# Patient Record
Sex: Female | Born: 1945
Health system: Southern US, Community
[De-identification: ages and names within clinical notes are randomized; demographics above are authoritative.]

## PROBLEM LIST (undated history)

## (undated) DIAGNOSIS — R011 Cardiac murmur, unspecified: Secondary | ICD-10-CM

## (undated) DIAGNOSIS — C4492 Squamous cell carcinoma of skin, unspecified: Secondary | ICD-10-CM

## (undated) DIAGNOSIS — B000 Eczema herpeticum: Secondary | ICD-10-CM

## (undated) DIAGNOSIS — T7840XA Allergy, unspecified, initial encounter: Secondary | ICD-10-CM

## (undated) DIAGNOSIS — I10 Essential (primary) hypertension: Secondary | ICD-10-CM

## (undated) DIAGNOSIS — N959 Unspecified menopausal and perimenopausal disorder: Secondary | ICD-10-CM

## (undated) DIAGNOSIS — H409 Unspecified glaucoma: Secondary | ICD-10-CM

## (undated) DIAGNOSIS — M8580 Other specified disorders of bone density and structure, unspecified site: Secondary | ICD-10-CM

## (undated) DIAGNOSIS — E785 Hyperlipidemia, unspecified: Secondary | ICD-10-CM

## (undated) HISTORY — DX: Other specified disorders of bone density and structure, unspecified site: M85.80

## (undated) HISTORY — DX: Squamous cell carcinoma of skin, unspecified: C44.92

## (undated) HISTORY — PX: OTHER SURGICAL HISTORY: SHX169

## (undated) HISTORY — DX: Hyperlipidemia, unspecified: E78.5

## (undated) HISTORY — DX: Eczema herpeticum: B00.0

## (undated) HISTORY — DX: Unspecified menopausal and perimenopausal disorder: N95.9

## (undated) HISTORY — DX: Unspecified glaucoma: H40.9

## (undated) HISTORY — DX: Essential (primary) hypertension: I10

## (undated) HISTORY — DX: Allergy, unspecified, initial encounter: T78.40XA

## (undated) HISTORY — DX: Cardiac murmur, unspecified: R01.1

## (undated) HISTORY — PX: ABDOMINAL HYSTERECTOMY: SHX81

---

## 2004-09-27 ENCOUNTER — Ambulatory Visit: Payer: Self-pay | Admitting: Gastroenterology

## 2009-03-23 ENCOUNTER — Emergency Department: Payer: Self-pay | Admitting: Emergency Medicine

## 2010-05-27 ENCOUNTER — Ambulatory Visit: Payer: Self-pay

## 2010-12-12 HISTORY — PX: BREAST BIOPSY: SHX20

## 2011-01-04 ENCOUNTER — Ambulatory Visit: Payer: Self-pay | Admitting: Family Medicine

## 2011-01-11 ENCOUNTER — Ambulatory Visit: Payer: Self-pay | Admitting: Family Medicine

## 2011-08-11 ENCOUNTER — Ambulatory Visit: Payer: Self-pay | Admitting: General Surgery

## 2011-08-11 ENCOUNTER — Emergency Department: Payer: Self-pay | Admitting: Emergency Medicine

## 2012-02-06 ENCOUNTER — Ambulatory Visit: Payer: Self-pay | Admitting: Family Medicine

## 2012-02-08 ENCOUNTER — Ambulatory Visit: Payer: Self-pay | Admitting: Family Medicine

## 2012-07-19 DIAGNOSIS — E785 Hyperlipidemia, unspecified: Secondary | ICD-10-CM | POA: Diagnosis not present

## 2012-07-19 DIAGNOSIS — I10 Essential (primary) hypertension: Secondary | ICD-10-CM | POA: Diagnosis not present

## 2012-09-13 DIAGNOSIS — E785 Hyperlipidemia, unspecified: Secondary | ICD-10-CM | POA: Diagnosis not present

## 2013-02-01 DIAGNOSIS — E785 Hyperlipidemia, unspecified: Secondary | ICD-10-CM | POA: Diagnosis not present

## 2013-02-01 DIAGNOSIS — I10 Essential (primary) hypertension: Secondary | ICD-10-CM | POA: Diagnosis not present

## 2013-02-01 DIAGNOSIS — Z0000 Encounter for general adult medical examination without abnormal findings: Secondary | ICD-10-CM | POA: Diagnosis not present

## 2013-02-14 ENCOUNTER — Ambulatory Visit: Payer: Self-pay

## 2013-02-14 DIAGNOSIS — Z1231 Encounter for screening mammogram for malignant neoplasm of breast: Secondary | ICD-10-CM | POA: Diagnosis not present

## 2013-02-20 ENCOUNTER — Ambulatory Visit: Payer: Self-pay

## 2013-02-20 DIAGNOSIS — M899 Disorder of bone, unspecified: Secondary | ICD-10-CM | POA: Diagnosis not present

## 2013-02-20 DIAGNOSIS — M949 Disorder of cartilage, unspecified: Secondary | ICD-10-CM | POA: Diagnosis not present

## 2013-03-01 DIAGNOSIS — I10 Essential (primary) hypertension: Secondary | ICD-10-CM | POA: Diagnosis not present

## 2013-08-09 DIAGNOSIS — I10 Essential (primary) hypertension: Secondary | ICD-10-CM | POA: Diagnosis not present

## 2013-08-09 DIAGNOSIS — E785 Hyperlipidemia, unspecified: Secondary | ICD-10-CM | POA: Diagnosis not present

## 2013-08-26 DIAGNOSIS — I10 Essential (primary) hypertension: Secondary | ICD-10-CM | POA: Diagnosis not present

## 2013-10-08 DIAGNOSIS — I10 Essential (primary) hypertension: Secondary | ICD-10-CM | POA: Diagnosis not present

## 2013-10-10 DIAGNOSIS — H52229 Regular astigmatism, unspecified eye: Secondary | ICD-10-CM | POA: Diagnosis not present

## 2013-10-10 DIAGNOSIS — H2510 Age-related nuclear cataract, unspecified eye: Secondary | ICD-10-CM | POA: Diagnosis not present

## 2013-10-10 DIAGNOSIS — H5200 Hypermetropia, unspecified eye: Secondary | ICD-10-CM | POA: Diagnosis not present

## 2013-10-10 DIAGNOSIS — H524 Presbyopia: Secondary | ICD-10-CM | POA: Diagnosis not present

## 2013-12-12 HISTORY — PX: BREAST BIOPSY: SHX20

## 2014-02-18 ENCOUNTER — Ambulatory Visit: Payer: Self-pay

## 2014-02-18 DIAGNOSIS — Z1231 Encounter for screening mammogram for malignant neoplasm of breast: Secondary | ICD-10-CM | POA: Diagnosis not present

## 2014-04-02 DIAGNOSIS — Z23 Encounter for immunization: Secondary | ICD-10-CM | POA: Diagnosis not present

## 2014-04-02 DIAGNOSIS — R3129 Other microscopic hematuria: Secondary | ICD-10-CM | POA: Diagnosis not present

## 2014-04-02 DIAGNOSIS — E785 Hyperlipidemia, unspecified: Secondary | ICD-10-CM | POA: Diagnosis not present

## 2014-04-02 DIAGNOSIS — Z0000 Encounter for general adult medical examination without abnormal findings: Secondary | ICD-10-CM | POA: Diagnosis not present

## 2014-05-06 DIAGNOSIS — R319 Hematuria, unspecified: Secondary | ICD-10-CM | POA: Diagnosis not present

## 2014-10-20 DIAGNOSIS — E785 Hyperlipidemia, unspecified: Secondary | ICD-10-CM | POA: Diagnosis not present

## 2014-10-20 DIAGNOSIS — I10 Essential (primary) hypertension: Secondary | ICD-10-CM | POA: Diagnosis not present

## 2015-03-06 ENCOUNTER — Emergency Department: Payer: Self-pay | Admitting: Student

## 2015-03-06 DIAGNOSIS — Z79899 Other long term (current) drug therapy: Secondary | ICD-10-CM | POA: Diagnosis not present

## 2015-03-06 DIAGNOSIS — R000 Tachycardia, unspecified: Secondary | ICD-10-CM | POA: Diagnosis not present

## 2015-03-06 DIAGNOSIS — R509 Fever, unspecified: Secondary | ICD-10-CM | POA: Diagnosis not present

## 2015-03-06 DIAGNOSIS — I10 Essential (primary) hypertension: Secondary | ICD-10-CM | POA: Diagnosis not present

## 2015-03-06 DIAGNOSIS — R0602 Shortness of breath: Secondary | ICD-10-CM | POA: Diagnosis not present

## 2015-03-06 DIAGNOSIS — B349 Viral infection, unspecified: Secondary | ICD-10-CM | POA: Diagnosis not present

## 2015-03-06 DIAGNOSIS — R9431 Abnormal electrocardiogram [ECG] [EKG]: Secondary | ICD-10-CM | POA: Diagnosis not present

## 2015-03-06 LAB — URINALYSIS, COMPLETE
Bacteria: NONE SEEN
Bilirubin,UR: NEGATIVE
Glucose,UR: NEGATIVE mg/dL
Ketone: NEGATIVE
Leukocyte Esterase: NEGATIVE
Nitrite: NEGATIVE
Ph: 8
Protein: NEGATIVE
RBC,UR: 1 /HPF
Specific Gravity: 1.006
Squamous Epithelial: 16
WBC UR: <1 /HPF

## 2015-03-06 LAB — COMPREHENSIVE METABOLIC PANEL WITH GFR
Albumin: 3.8 g/dL
Alkaline Phosphatase: 59 U/L
Anion Gap: 8
BUN: 8 mg/dL
Bilirubin,Total: 0.6 mg/dL
Calcium, Total: 8.6 mg/dL — ABNORMAL LOW
Chloride: 106 mmol/L
Co2: 25 mmol/L
Creatinine: 0.66 mg/dL
EGFR (African American): >60
EGFR (Non-African Amer.): >60
Glucose: 125 mg/dL — ABNORMAL HIGH
Potassium: 3.4 mmol/L — ABNORMAL LOW
SGOT(AST): 21 U/L
SGPT (ALT): 19 U/L
Sodium: 139 mmol/L
Total Protein: 6.7 g/dL

## 2015-03-06 LAB — CBC WITH DIFFERENTIAL/PLATELET
BASOS PCT: 0.9 %
Basophil #: 0.1 10*3/uL (ref 0.0–0.1)
EOS PCT: 0.1 %
Eosinophil #: 0 10*3/uL (ref 0.0–0.7)
HCT: 41.9 % (ref 35.0–47.0)
HGB: 13.7 g/dL (ref 12.0–16.0)
Lymphocyte #: 0.5 10*3/uL — ABNORMAL LOW (ref 1.0–3.6)
Lymphocyte %: 3.3 %
MCH: 30 pg (ref 26.0–34.0)
MCHC: 32.6 g/dL (ref 32.0–36.0)
MCV: 92 fL (ref 80–100)
MONO ABS: 0.5 x10 3/mm (ref 0.2–0.9)
MONOS PCT: 3.3 %
Neutrophil #: 14.5 10*3/uL — ABNORMAL HIGH (ref 1.4–6.5)
Neutrophil %: 92.4 %
Platelet: 278 10*3/uL (ref 150–440)
RBC: 4.55 10*6/uL (ref 3.80–5.20)
RDW: 14.2 % (ref 11.5–14.5)
WBC: 15.7 10*3/uL — AB (ref 3.6–11.0)

## 2015-03-06 LAB — PHOSPHORUS: Phosphorus: 2.1 mg/dL — ABNORMAL LOW

## 2015-03-06 LAB — LACTIC ACID, PLASMA: Lactic Acid, Venous: 1.1 mmol/L

## 2015-03-06 LAB — PROTIME-INR
INR: 1.1
Prothrombin Time: 14 s

## 2015-03-06 LAB — MAGNESIUM: MAGNESIUM: 1.6 mg/dL — AB

## 2015-03-06 LAB — RAPID INFLUENZA A&B ANTIGENS (ARMC ONLY)

## 2015-03-06 LAB — TROPONIN I: Troponin-I: <0.03 ng/mL

## 2015-03-07 LAB — URINE CULTURE

## 2015-03-11 LAB — CULTURE, BLOOD (SINGLE)

## 2015-03-18 DIAGNOSIS — R070 Pain in throat: Secondary | ICD-10-CM | POA: Diagnosis not present

## 2015-03-18 DIAGNOSIS — J029 Acute pharyngitis, unspecified: Secondary | ICD-10-CM | POA: Diagnosis not present

## 2015-03-20 ENCOUNTER — Ambulatory Visit
Admit: 2015-03-20 | Disposition: A | Discharge status: Home / Self Care | Payer: Self-pay | Attending: Unknown Physician Specialty | Admitting: Unknown Physician Specialty

## 2015-03-20 DIAGNOSIS — Z1231 Encounter for screening mammogram for malignant neoplasm of breast: Secondary | ICD-10-CM | POA: Diagnosis not present

## 2015-03-20 LAB — HM MAMMOGRAPHY: HM MAMMO: NORMAL (ref 0–4)

## 2015-04-20 DIAGNOSIS — I10 Essential (primary) hypertension: Secondary | ICD-10-CM | POA: Diagnosis not present

## 2015-04-20 DIAGNOSIS — E785 Hyperlipidemia, unspecified: Secondary | ICD-10-CM | POA: Diagnosis not present

## 2015-07-06 ENCOUNTER — Other Ambulatory Visit: Payer: Self-pay | Admitting: Unknown Physician Specialty

## 2015-08-31 ENCOUNTER — Other Ambulatory Visit: Payer: Self-pay

## 2015-08-31 MED ORDER — ESTRADIOL 0.075 MG/24HR TD PTTW: 1.0000 | MEDICATED_PATCH | TRANSDERMAL | Status: DC

## 2015-08-31 NOTE — Telephone Encounter (Signed)
Patient was last seen 04/20/15, practice partner number is [phone], and pharmacy is [facility] in [place].

## 2015-12-28 ENCOUNTER — Other Ambulatory Visit: Payer: Self-pay | Admitting: Unknown Physician Specialty

## 2016-01-06 IMAGING — MG MM DIGITAL SCREENING BILAT W/ CAD
1 series · 4 of 4 positions shown · non-contrast
Comparison: none

[R CC · right · 4 of 4 slices shown]
[im 1/4]
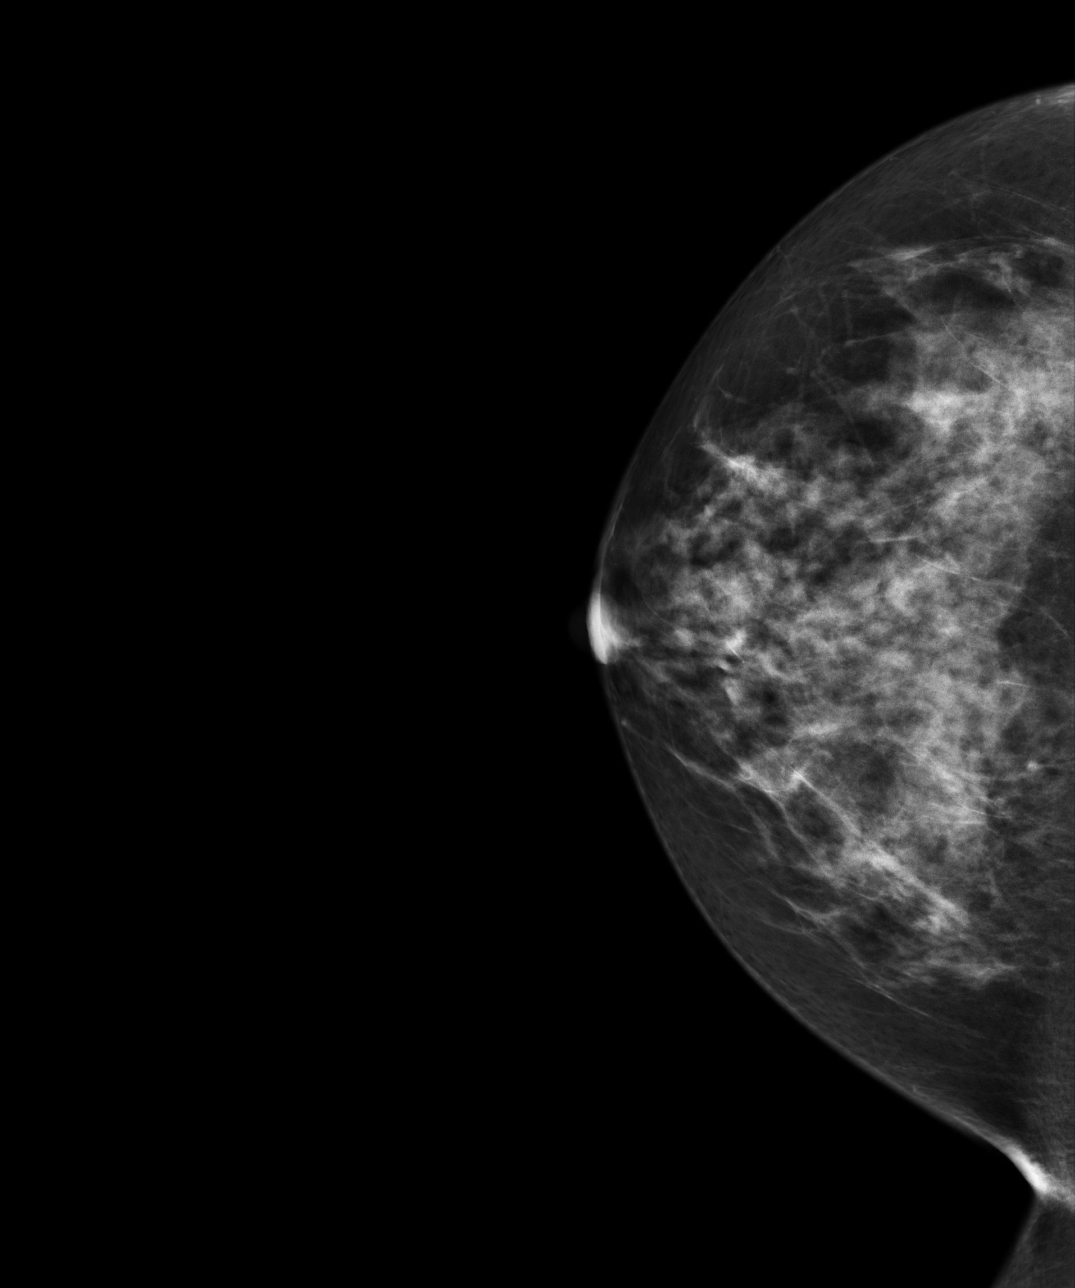
[im 2/4]
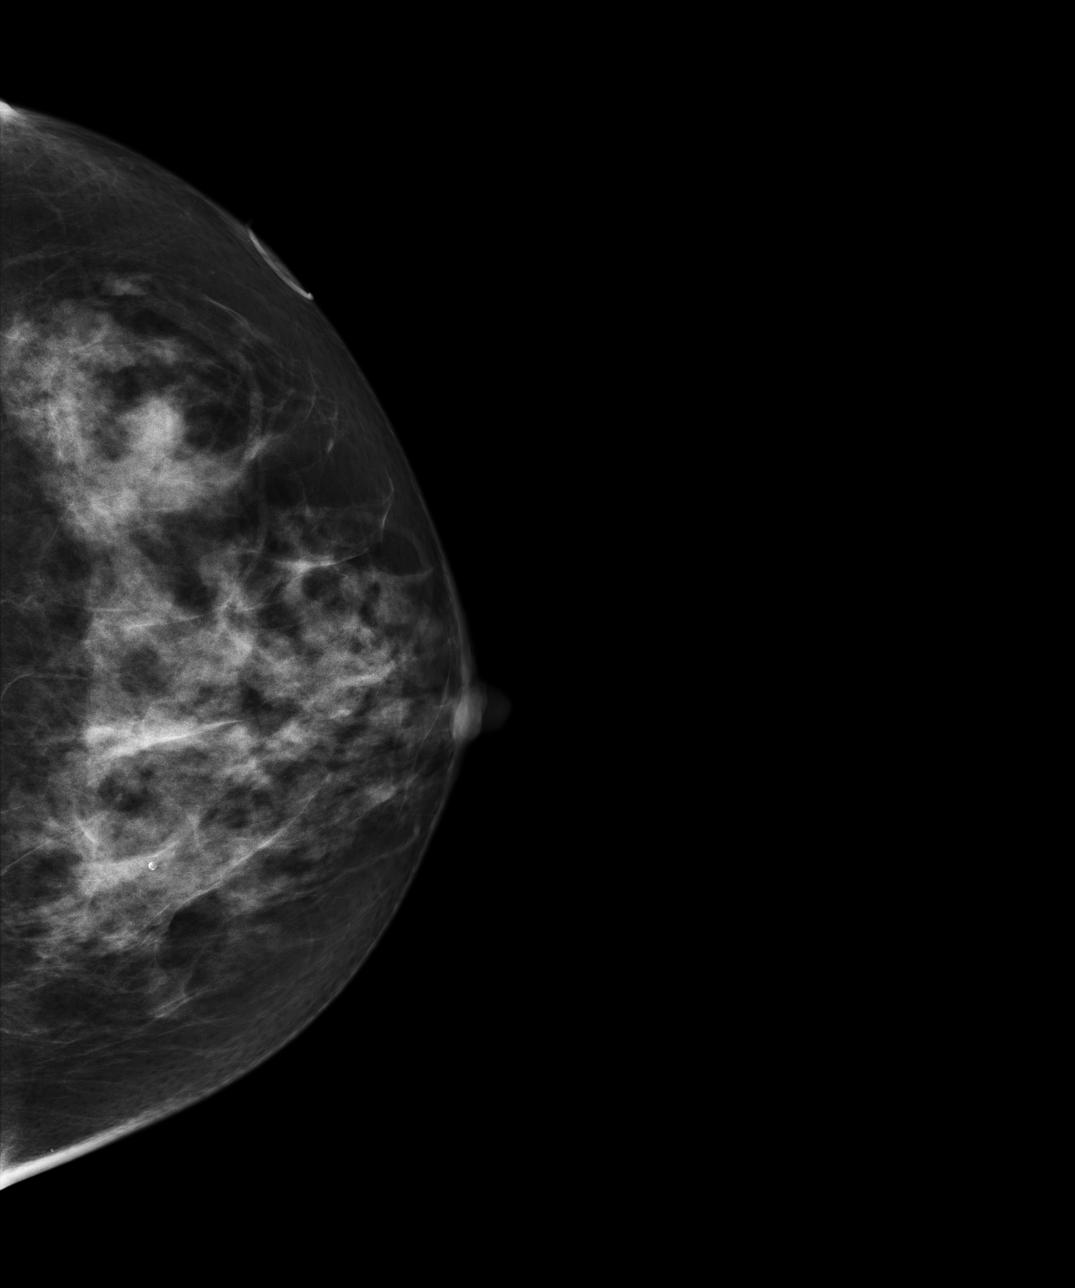
[im 3/4]
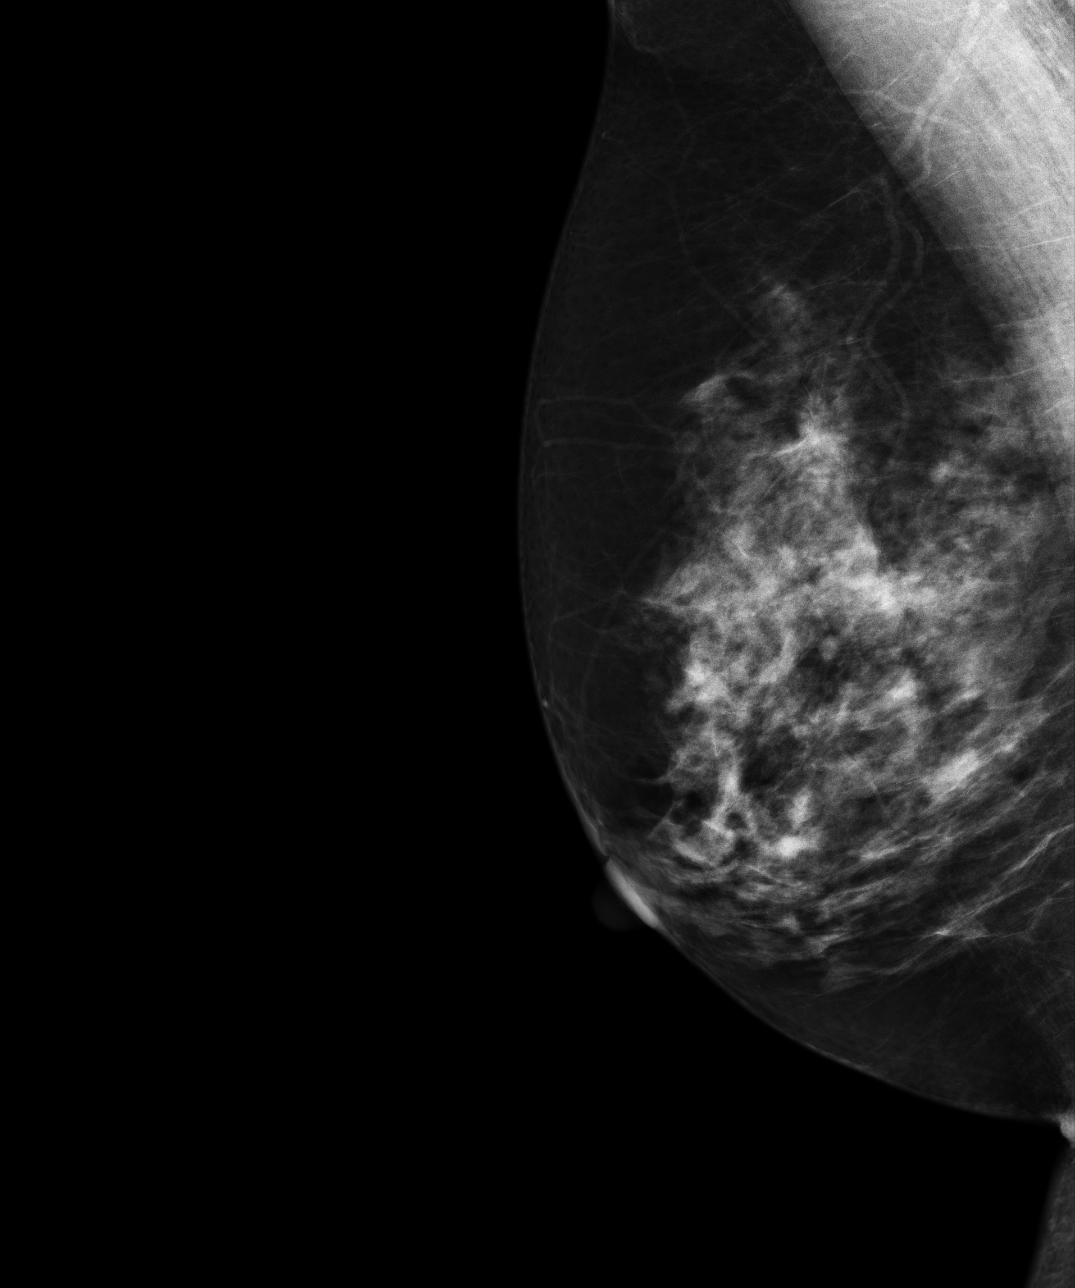
[im 4/4]
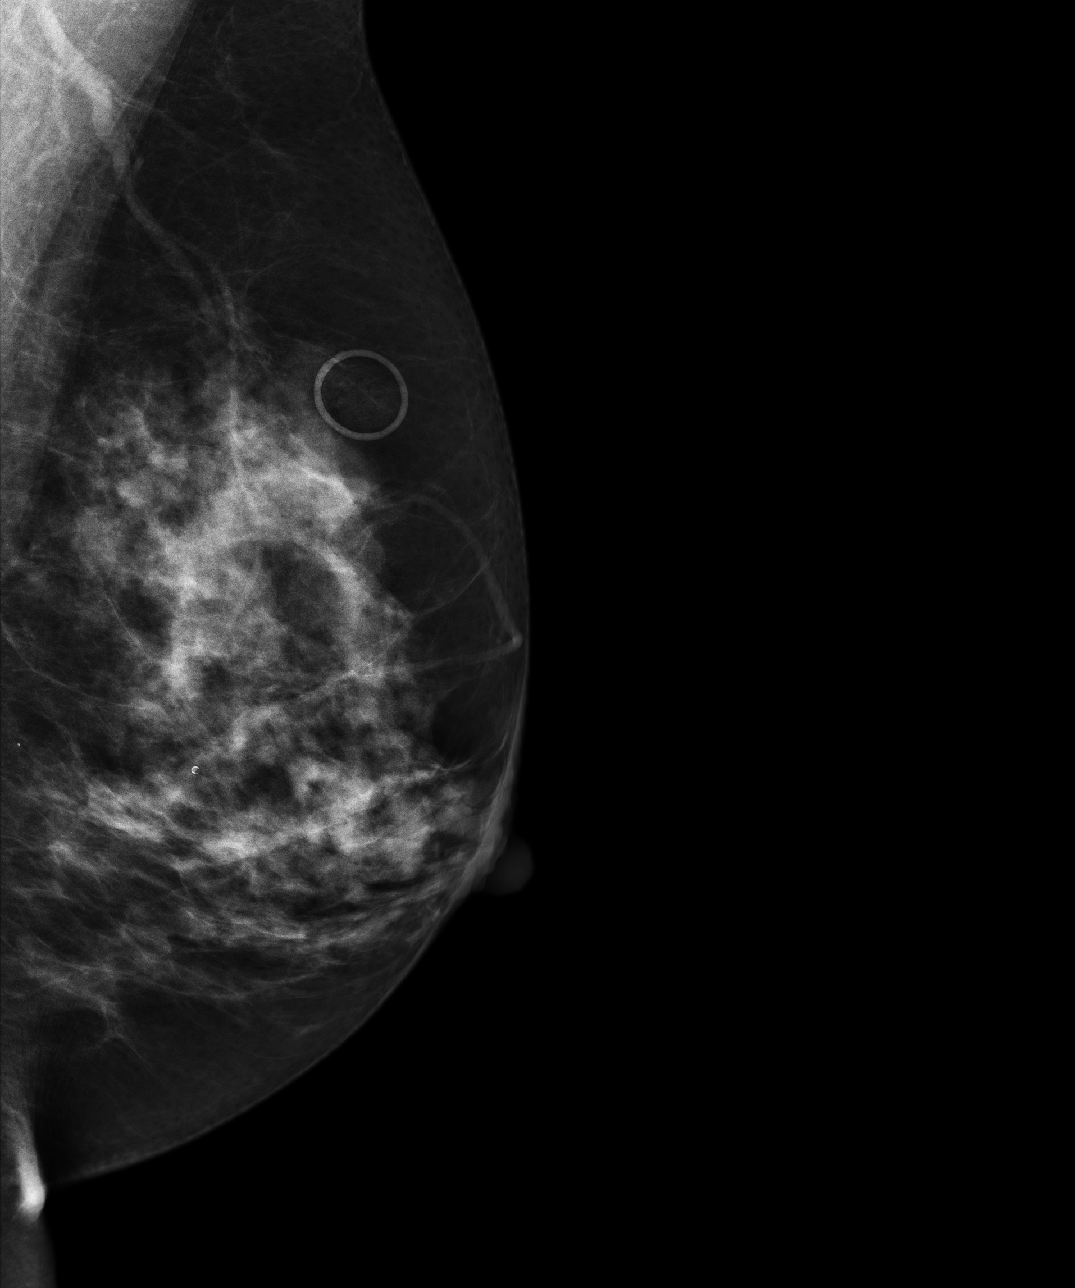

[4 of 4 positions shown; findings below may reference images not displayed]

CLINICAL DATA
Screening.

EXAM
DIGITAL SCREENING BILATERAL MAMMOGRAM WITH CAD

COMPARISON
Previous exam(s).

ACR BREAST DENSITY
ACR Breast Density Category c: The breast tissue is heterogeneously
dense, which may obscure small masses.

FINDINGS
There are no findings suspicious for malignancy. Images were
processed with CAD.

IMPRESSION
No mammographic evidence of malignancy. A result letter of this
screening mammogram will be mailed directly to the patient.

RECOMMENDATION
Screening mammogram in one year. (Code:[id number])

BI-RADS CATEGORY
1: Negative.

SIGNATURE

## 2016-02-18 ENCOUNTER — Encounter: Payer: Self-pay | Admitting: *Deleted

## 2016-03-07 ENCOUNTER — Other Ambulatory Visit: Payer: Self-pay | Admitting: Unknown Physician Specialty

## 2016-03-22 DIAGNOSIS — I10 Essential (primary) hypertension: Secondary | ICD-10-CM | POA: Insufficient documentation

## 2016-03-22 DIAGNOSIS — M85851 Other specified disorders of bone density and structure, right thigh: Secondary | ICD-10-CM | POA: Insufficient documentation

## 2016-03-22 DIAGNOSIS — B000 Eczema herpeticum: Secondary | ICD-10-CM | POA: Insufficient documentation

## 2016-03-22 DIAGNOSIS — N959 Unspecified menopausal and perimenopausal disorder: Secondary | ICD-10-CM | POA: Insufficient documentation

## 2016-03-22 DIAGNOSIS — E785 Hyperlipidemia, unspecified: Secondary | ICD-10-CM | POA: Insufficient documentation

## 2016-03-22 DIAGNOSIS — M8580 Other specified disorders of bone density and structure, unspecified site: Secondary | ICD-10-CM

## 2016-04-13 ENCOUNTER — Ambulatory Visit (INDEPENDENT_AMBULATORY_CARE_PROVIDER_SITE_OTHER): Payer: Medicare Other | Admitting: Unknown Physician Specialty

## 2016-04-13 ENCOUNTER — Encounter: Payer: Self-pay | Admitting: Unknown Physician Specialty

## 2016-04-13 VITALS — BP 123/86 | HR 87 | Temp 97.4°F | Ht 63.1 in | Wt 167.0 lb

## 2016-04-13 DIAGNOSIS — I10 Essential (primary) hypertension: Secondary | ICD-10-CM | POA: Diagnosis not present

## 2016-04-13 DIAGNOSIS — N959 Unspecified menopausal and perimenopausal disorder: Secondary | ICD-10-CM

## 2016-04-13 DIAGNOSIS — Z0000 Encounter for general adult medical examination without abnormal findings: Secondary | ICD-10-CM

## 2016-04-13 DIAGNOSIS — Z23 Encounter for immunization: Secondary | ICD-10-CM

## 2016-04-13 DIAGNOSIS — Z1231 Encounter for screening mammogram for malignant neoplasm of breast: Secondary | ICD-10-CM | POA: Diagnosis not present

## 2016-04-13 MED ORDER — ESTRADIOL 0.075 MG/24HR TD PTTW: 1.0000 | MEDICATED_PATCH | TRANSDERMAL | Status: DC

## 2016-04-13 NOTE — Assessment & Plan Note (Signed)
Stable, continue present medications.   

## 2016-04-13 NOTE — Progress Notes (Signed)
BP 123/86 mmHg  Pulse 87  Temp(Src) 97.4 F (36.3 C)  Ht 5' 3.1" (1.603 m)  Wt 167 lb (75.751 kg)  BMI 29.48 kg/m2  SpO2 97%  LMP  (LMP Unknown)   Subjective:    Patient ID: Janet Taylor, female    DOB: Aug 08, 1946, 70 y.o.   MRN: [id number]  HPI: Janet Taylor is a 70 y.o. female  Chief Complaint  Patient presents with  . Medicare Wellness    Hep C order entered   Functional Status Survey: Is the patient deaf or have difficulty hearing?: No Does the patient have difficulty seeing, even when wearing glasses/contacts?: Yes (pt states she has cataracts) Does the patient have difficulty concentrating, remembering, or making decisions?: No Does the patient have difficulty walking or climbing stairs?: No Does the patient have difficulty dressing or bathing?: No Does the patient have difficulty doing errands alone such as visiting a doctor's office or shopping?: No  Fall Risk  04/13/2016 12/04/2015  Falls in the past year? No No   Depression screen PHQ 2/9 04/13/2016  Decreased Interest 0  Down, Depressed, Hopeless 0  PHQ - 2 Score 0    Family History  Problem Relation Age of Onset  . Hypertension Father   . Hypertension Sister    Social History   Social History  . Marital Status: Divorced    Spouse Name: N/A  . Number of Children: N/A  . Years of Education: N/A   Occupational History  . Not on file.   Social History Main Topics  . Smoking status: Never Smoker   . Smokeless tobacco: Never Used  . Alcohol Use: 0.0 oz/week    0 Standard drinks or equivalent per week     Comment: on weekends  . Drug Use: No  . Sexual Activity: No   Other Topics Concern  . Not on file   Social History Narrative   Past Surgical History  Procedure Laterality Date  . Abdominal hysterectomy      complete   Past Medical History  Diagnosis Date  . Osteopenia   . Hypertension   . Eczema herpeticum   . Hyperlipidemia   . Menopausal disorder     Hypertension Using  medications without difficulty Average home BPs: not checking   No problems or lightheadedness No chest pain with exertion or shortness of breath No Edema  Menopause Trying to decrease use of hormone patch.    Mini cog is normal  Relevant past medical, surgical, family and social history reviewed and updated as indicated. Interim medical history since our last visit reviewed. Allergies and medications reviewed and updated.  Review of Systems  Per HPI unless specifically indicated above     Objective:    BP 123/86 mmHg  Pulse 87  Temp(Src) 97.4 F (36.3 C)  Ht 5' 3.1" (1.603 m)  Wt 167 lb (75.751 kg)  BMI 29.48 kg/m2  SpO2 97%  LMP  (LMP Unknown)  Wt Readings from Last 3 Encounters:  04/13/16 167 lb (75.751 kg)  04/20/15 163 lb (73.936 kg)    Physical Exam  Constitutional: She is oriented to person, place, and time. She appears well-developed and well-nourished.  HENT:  Head: Normocephalic and atraumatic.  Eyes: Pupils are equal, round, and reactive to light. Right eye exhibits no discharge. Left eye exhibits no discharge. No scleral icterus.  Neck: Normal range of motion. Neck supple. Carotid bruit is not present. No thyromegaly present.  Cardiovascular: Normal rate, regular rhythm and  normal heart sounds.  Exam reveals no gallop and no friction rub.   No murmur heard. Pulmonary/Chest: Effort normal and breath sounds normal. No respiratory distress. She has no wheezes. She has no rales.  Abdominal: Soft. Bowel sounds are normal. There is no tenderness. There is no rebound.  Genitourinary: No breast swelling, tenderness or discharge.  Musculoskeletal: Normal range of motion.  Lymphadenopathy:    She has no cervical adenopathy.  Neurological: She is alert and oriented to person, place, and time.  Skin: Skin is warm, dry and intact. No rash noted.  Psychiatric: She has a normal mood and affect. Her speech is normal and behavior is normal. Judgment and thought content  normal. Cognition and memory are normal.    Results for orders placed or performed in visit on 03/22/16  HM MAMMOGRAPHY  Result Value Ref Range   HM Mammogram Self Reported Normal 0-4 Bi-Rad, Self Reported Normal      Assessment & Plan:   Problem List Items Addressed This Visit      Unprioritized   Hypertension    Stable, continue present medications.        Relevant Orders   Comprehensive metabolic panel   Lipid Panel w/o Chol/HDL Ratio   Menopausal disorder    Continue to decrease use of hormone patch as tolerated       Other Visit Diagnoses    Need for pneumococcal vaccination    -  Primary    Relevant Orders    Pneumococcal polysaccharide vaccine 23-valent greater than or equal to 2yo subcutaneous/IM (Completed)    Health care maintenance        Relevant Orders    Hepatitis C antibody    MM DIGITAL SCREENING BILATERAL    Cologuard    Encounter for screening mammogram for breast cancer        Relevant Orders    MM DIGITAL SCREENING BILATERAL        Follow up plan: Return in about 6 months (around 10/14/2016).

## 2016-04-13 NOTE — Assessment & Plan Note (Signed)
Continue to decrease use of hormone patch as tolerated

## 2016-04-13 NOTE — Patient Instructions (Addendum)
Pneumococcal Polysaccharide Vaccine: What You Need to Know 1. Why get vaccinated? Vaccination can protect older adults (and some children and younger adults) from pneumococcal disease. Pneumococcal disease is caused by bacteria that can spread from person to person through close contact. It can cause ear infections, and it can also lead to more serious infections of the:   Lungs (pneumonia),  Blood (bacteremia), and  Covering of the brain and spinal cord (meningitis). Meningitis can cause deafness and brain damage, and it can be fatal. Anyone can get pneumococcal disease, but children under 62 years of age, people with certain medical conditions, adults over 68 years of age, and cigarette smokers are at the highest risk. About 18,000 older adults die each year from pneumococcal disease in the [place]. Treatment of pneumococcal infections with penicillin and other drugs used to be more effective. But some strains of the disease have become resistant to these drugs. This makes prevention of the disease, through vaccination, even more important. 2. Pneumococcal polysaccharide vaccine (PPSV23) Pneumococcal polysaccharide vaccine (PPSV23) protects against 23 types of pneumococcal bacteria. It will not prevent all pneumococcal disease. PPSV23 is recommended for:  All adults 6 years of age and older,  Anyone 2 through 69 years of age with certain long-term health problems,  Anyone 2 through 69 years of age with a weakened immune system,  Adults 64 through 69 years of age who smoke cigarettes or have asthma. Most people need only one dose of PPSV. A second dose is recommended for certain high-risk groups. People 53 and older should get a dose even if they have gotten one or more doses of the vaccine before they turned 65. Your healthcare provider can give you more information about these recommendations. Most healthy adults develop protection within 2 to 3 weeks of getting the shot. 3. Some  people should not get this vaccine  Anyone who has had a life-threatening allergic reaction to PPSV should not get another dose.  Anyone who has a severe allergy to any component of PPSV should not receive it. Tell your provider if you have any severe allergies.  Anyone who is moderately or severely ill when the shot is scheduled may be asked to wait until they recover before getting the vaccine. Someone with a mild illness can usually be vaccinated.  Children less than 83 years of age should not receive this vaccine.  There is no evidence that PPSV is harmful to either a pregnant woman or to her fetus. However, as a precaution, women who need the vaccine should be vaccinated before becoming pregnant, if possible. 4. Risks of a vaccine reaction With any medicine, including vaccines, there is a chance of side effects. These are usually mild and go away on their own, but serious reactions are also possible. About half of people who get PPSV have mild side effects, such as redness or pain where the shot is given, which go away within about two days. Less than 1 out of 100 people develop a fever, muscle aches, or more severe local reactions. Problems that could happen after any vaccine:  People sometimes faint after a medical procedure, including vaccination. Sitting or lying down for about 15 minutes can help prevent fainting, and injuries caused by a fall. Tell your doctor if you feel dizzy, or have vision changes or ringing in the ears.  Some people get severe pain in the shoulder and have difficulty moving the arm where a shot was given. This happens very rarely.  Any medication  cause a severe allergic reaction. Such reactions from a vaccine are very rare, estimated at about 1 in a million doses, and would happen within a few minutes to a few hours after the vaccination. As with any medicine, there is a very remote chance of a vaccine causing a serious injury or death. The safety of  vaccines is always being monitored. For more information, visit: www.cdc.gov/vaccinesafety/ 5. What if there is a serious reaction? What should I look for? Look for anything that concerns you, such as signs of a severe allergic reaction, very high fever, or unusual behavior. Signs of a severe allergic reaction can include hives, swelling of the face and throat, difficulty breathing, a fast heartbeat, dizziness, and weakness. These would usually start a few minutes to a few hours after the vaccination. What should I do? If you think it is a severe allergic reaction or other emergency that can't wait, call 9-1-1 or get to the nearest hospital. Otherwise, call your doctor. Afterward, the reaction should be reported to the Vaccine Adverse Event Reporting System (VAERS). Your doctor might file this report, or you can do it yourself through the VAERS web site at www.vaers.hhs.gov, or by calling 1-800-822-7967. VAERS does not give medical advice. 6. How can I learn more?  Ask your doctor. He or she can give you the vaccine package insert or suggest other sources of information.  Call your local or state health department.  Contact the Centers for Disease Control and Prevention (CDC):  Call 1-800-232-4636 (1-800-CDC-INFO) or  Visit CDC's website at www.cdc.gov/vaccines CDC Pneumococcal Polysaccharide Vaccine VIS (04/04/14) This information is not intended to replace advice given to you by your health care provider. Make sure you discuss any questions you have with your health care provider. ----------------------------------------------------------------------  Please do call to schedule your mammogram; the number to schedule one at either [facility] or [facility] is [phone].     

## 2016-04-14 LAB — LIPID PANEL W/O CHOL/HDL RATIO
CHOLESTEROL TOTAL: 262 mg/dL — AB (ref 100–199)
HDL: 63 mg/dL (ref >39)
LDL Calculated: 183 mg/dL — ABNORMAL HIGH (ref 0–99)
Triglycerides: 80 mg/dL (ref 0–149)
VLDL CHOLESTEROL CAL: 16 mg/dL (ref 5–40)

## 2016-04-14 LAB — COMPREHENSIVE METABOLIC PANEL
ALK PHOS: 54 IU/L (ref 39–117)
ALT: 18 IU/L (ref 0–32)
AST: 18 IU/L (ref 0–40)
Albumin/Globulin Ratio: 1.8 (ref 1.2–2.2)
Albumin: 4.4 g/dL (ref 3.6–4.8)
BILIRUBIN TOTAL: 0.4 mg/dL (ref 0.0–1.2)
BUN / CREAT RATIO: 15 (ref 12–28)
BUN: 11 mg/dL (ref 8–27)
CHLORIDE: 95 mmol/L — AB (ref 96–106)
CO2: 25 mmol/L (ref 18–29)
Calcium: 9.6 mg/dL (ref 8.7–10.3)
Creatinine, Ser: 0.73 mg/dL (ref 0.57–1.00)
GFR calc Af Amer: 98 mL/min/{1.73_m2} (ref >59)
GFR calc non Af Amer: 85 mL/min/{1.73_m2} (ref >59)
GLUCOSE: 79 mg/dL (ref 65–99)
Globulin, Total: 2.5 g/dL (ref 1.5–4.5)
Potassium: 3.8 mmol/L (ref 3.5–5.2)
Sodium: 136 mmol/L (ref 134–144)
Total Protein: 6.9 g/dL (ref 6.0–8.5)

## 2016-04-14 LAB — HEPATITIS C ANTIBODY: Hep C Virus Ab: 0.1 s/co ratio (ref 0.0–0.9)

## 2016-04-15 ENCOUNTER — Telehealth: Payer: Self-pay | Admitting: Unknown Physician Specialty

## 2016-04-15 NOTE — Telephone Encounter (Signed)
Left message to call back about cholesterol

## 2016-04-18 ENCOUNTER — Telehealth: Payer: Self-pay | Admitting: Unknown Physician Specialty

## 2016-04-18 NOTE — Telephone Encounter (Signed)
Discussed very high cholesterol with patient.  She is refusing cholesterol medications and wants to work on her diet.

## 2016-05-26 DIAGNOSIS — R55 Syncope and collapse: Secondary | ICD-10-CM | POA: Diagnosis not present

## 2016-05-26 DIAGNOSIS — I10 Essential (primary) hypertension: Secondary | ICD-10-CM | POA: Diagnosis not present

## 2016-05-26 DIAGNOSIS — R112 Nausea with vomiting, unspecified: Secondary | ICD-10-CM | POA: Diagnosis not present

## 2016-05-26 DIAGNOSIS — R42 Dizziness and giddiness: Secondary | ICD-10-CM | POA: Diagnosis not present

## 2016-05-26 DIAGNOSIS — R404 Transient alteration of awareness: Secondary | ICD-10-CM | POA: Diagnosis not present

## 2016-05-26 DIAGNOSIS — R002 Palpitations: Secondary | ICD-10-CM | POA: Diagnosis not present

## 2016-05-26 DIAGNOSIS — I4891 Unspecified atrial fibrillation: Secondary | ICD-10-CM | POA: Diagnosis not present

## 2016-06-02 ENCOUNTER — Other Ambulatory Visit: Payer: Self-pay | Admitting: Unknown Physician Specialty

## 2016-06-02 ENCOUNTER — Ambulatory Visit
Admission: RE | Admit: 2016-06-02 | Discharge: 2016-06-02 | Disposition: A | Discharge status: Home / Self Care | Payer: Medicare Other | Source: Ambulatory Visit | Attending: Unknown Physician Specialty | Admitting: Unknown Physician Specialty

## 2016-06-02 DIAGNOSIS — Z1231 Encounter for screening mammogram for malignant neoplasm of breast: Secondary | ICD-10-CM

## 2016-06-02 DIAGNOSIS — Z0000 Encounter for general adult medical examination without abnormal findings: Secondary | ICD-10-CM

## 2016-06-06 ENCOUNTER — Other Ambulatory Visit: Payer: Self-pay | Admitting: Unknown Physician Specialty

## 2016-06-06 DIAGNOSIS — R928 Other abnormal and inconclusive findings on diagnostic imaging of breast: Secondary | ICD-10-CM

## 2016-06-15 DIAGNOSIS — I10 Essential (primary) hypertension: Secondary | ICD-10-CM | POA: Diagnosis not present

## 2016-06-15 DIAGNOSIS — H25813 Combined forms of age-related cataract, bilateral: Secondary | ICD-10-CM | POA: Diagnosis not present

## 2016-06-15 DIAGNOSIS — H5203 Hypermetropia, bilateral: Secondary | ICD-10-CM | POA: Diagnosis not present

## 2016-06-15 DIAGNOSIS — H52222 Regular astigmatism, left eye: Secondary | ICD-10-CM | POA: Diagnosis not present

## 2016-06-21 ENCOUNTER — Ambulatory Visit
Admission: RE | Admit: 2016-06-21 | Discharge: 2016-06-21 | Disposition: A | Discharge status: Home / Self Care | Payer: Medicare Other | Source: Ambulatory Visit | Attending: Unknown Physician Specialty | Admitting: Unknown Physician Specialty

## 2016-06-21 DIAGNOSIS — R922 Inconclusive mammogram: Secondary | ICD-10-CM | POA: Diagnosis not present

## 2016-06-21 DIAGNOSIS — R928 Other abnormal and inconclusive findings on diagnostic imaging of breast: Secondary | ICD-10-CM

## 2016-06-21 DIAGNOSIS — N63 Unspecified lump in breast: Secondary | ICD-10-CM | POA: Diagnosis not present

## 2016-07-07 ENCOUNTER — Other Ambulatory Visit: Payer: Self-pay | Admitting: Unknown Physician Specialty

## 2016-08-05 DIAGNOSIS — H2513 Age-related nuclear cataract, bilateral: Secondary | ICD-10-CM | POA: Diagnosis not present

## 2016-08-05 DIAGNOSIS — H40003 Preglaucoma, unspecified, bilateral: Secondary | ICD-10-CM | POA: Diagnosis not present

## 2016-08-05 DIAGNOSIS — H02839 Dermatochalasis of unspecified eye, unspecified eyelid: Secondary | ICD-10-CM | POA: Diagnosis not present

## 2016-08-05 DIAGNOSIS — H18413 Arcus senilis, bilateral: Secondary | ICD-10-CM | POA: Diagnosis not present

## 2016-08-24 ENCOUNTER — Ambulatory Visit (INDEPENDENT_AMBULATORY_CARE_PROVIDER_SITE_OTHER): Payer: Medicare Other | Admitting: Family Medicine

## 2016-08-24 ENCOUNTER — Ambulatory Visit
Admission: RE | Admit: 2016-08-24 | Discharge: 2016-08-24 | Disposition: A | Discharge status: Home / Self Care | Payer: Medicare Other | Source: Ambulatory Visit | Attending: Family Medicine | Admitting: Family Medicine

## 2016-08-24 ENCOUNTER — Encounter: Payer: Self-pay | Admitting: Family Medicine

## 2016-08-24 VITALS — BP 146/87 | HR 98 | Temp 98.2°F | Ht 64.0 in | Wt 169.8 lb

## 2016-08-24 DIAGNOSIS — M25562 Pain in left knee: Secondary | ICD-10-CM | POA: Insufficient documentation

## 2016-08-24 DIAGNOSIS — I10 Essential (primary) hypertension: Secondary | ICD-10-CM | POA: Diagnosis not present

## 2016-08-24 DIAGNOSIS — M1712 Unilateral primary osteoarthritis, left knee: Secondary | ICD-10-CM | POA: Insufficient documentation

## 2016-08-24 MED ORDER — MELOXICAM 15 MG PO TABS: 15.0000 mg | ORAL_TABLET | Freq: Every day | ORAL | 0 refills | Status: DC

## 2016-08-24 NOTE — Progress Notes (Addendum)
BP (!) 146/87 (BP Location: Right Arm, Patient Position: Sitting, Cuff Size: Normal)   Pulse 98   Temp 98.2 F (36.8 C)   Ht 5\' 4"  (1.626 m)   Wt 169 lb 12.8 oz (77 kg)   LMP  (LMP Unknown)   SpO2 95%   BMI 29.15 kg/m    Subjective:    Patient ID: Janet Taylor, female    DOB: Apr 20, 1946, 70 y.o.   MRN: [id number]  HPI: Janet Taylor is a 70 y.o. female  Chief Complaint  Patient presents with  . Knee Pain    pt states she had a fall a couple of years ago and thinks her left knee may be hurting from that. States it mainly hurts when bending the knee    Patient presents with left knee pain that has been intermittent for several years now. Seemed to have started with a bad fall about 2 years ago. Got foot caught in a vine in the yard and fell backward, twisting left leg. Ankle swelled up and bruised right away. Had both ankles x-rayed but no imaging of left knee or medical evaluation since incident.  Pain is intermittent and mostly with taking stairs. Notices increasing crepitus with these types of movements over time. Knee popped really loud with a certain movement 2 nights ago and it has been very sore since over the anterior patella. Has not tried much over the counter at this time.   BP high today as she forgot her two BP medications today. Does monitor closely at home, BPs have been mostly normal but sometimes a little elevated. Denies symptoms. Tries to always remember medication.   Relevant past medical, surgical, family and social history reviewed and updated as indicated. Interim medical history since our last visit reviewed. Allergies and medications reviewed and updated.  Review of Systems  Constitutional: Negative.   HENT: Negative.   Respiratory: Negative.   Cardiovascular: Negative.   Gastrointestinal: Negative.   Genitourinary: Negative.   Musculoskeletal: Positive for arthralgias.  Skin: Negative.   Neurological: Negative.   Psychiatric/Behavioral: Negative.       Per HPI unless specifically indicated above     Objective:    BP (!) 146/87 (BP Location: Right Arm, Patient Position: Sitting, Cuff Size: Normal)   Pulse 98   Temp 98.2 F (36.8 C)   Ht 5\' 4"  (1.626 m)   Wt 169 lb 12.8 oz (77 kg)   LMP  (LMP Unknown)   SpO2 95%   BMI 29.15 kg/m   Wt Readings from Last 3 Encounters:  08/24/16 169 lb 12.8 oz (77 kg)  04/13/16 167 lb (75.8 kg)  04/20/15 163 lb (73.9 kg)    Physical Exam  Constitutional: She is oriented to person, place, and time. She appears well-developed and well-nourished. No distress.  HENT:  Head: Atraumatic.  Eyes: Conjunctivae are normal. No scleral icterus.  Neck: Normal range of motion. Neck supple.  Cardiovascular: Normal rate and normal heart sounds.   Pulmonary/Chest: Effort normal and breath sounds normal. No respiratory distress.  Musculoskeletal: Normal range of motion. She exhibits tenderness (mild ttp over anterior patella).  Moderate crepitus with passive ROM of left knee Normal gait on level ground  Neurological: She is alert and oriented to person, place, and time.  Skin: Skin is warm and dry.  Psychiatric: She has a normal mood and affect. Her behavior is normal.  Nursing note and vitals reviewed.     Assessment & Plan:   Problem  List Items Addressed This Visit      Cardiovascular and Mediastinum   Hypertension    Discussed importance of faithfully taking both medications every day. Will continue to monitor closely, will let us know if persistently high.        Other Visit Diagnoses    Left knee pain    -  Primary   Knee x-ray ordered, await results. Meloxicam and heat/ice as needed. Discussed only taking one/day, and not also taking OTC pain relievers.    Relevant Orders   DG Knee Complete 4 Views Left       Follow up plan: Return if symptoms worsen or fail to improve.

## 2016-08-24 NOTE — Patient Instructions (Signed)
Follow up as needed

## 2016-08-24 NOTE — Assessment & Plan Note (Signed)
Discussed importance of faithfully taking both medications every day. Will continue to monitor closely, will let us know if persistently high.

## 2016-08-25 ENCOUNTER — Telehealth: Payer: Self-pay | Admitting: Family Medicine

## 2016-08-25 NOTE — Telephone Encounter (Signed)
Please call pt and let her know her knee x-ray showed arthritis changes. She can continue the meloxicam daily, and can try tylenol arthritis and topical pain relievers as she needs them. No fractures or other issues from the fall were seen. Thanks

## 2016-08-25 NOTE — Telephone Encounter (Signed)
Patient notified

## 2016-08-25 NOTE — Telephone Encounter (Signed)
Called and left patient a voicemail asking for her to please return my call.  

## 2016-08-31 ENCOUNTER — Other Ambulatory Visit: Payer: Self-pay | Admitting: Family Medicine

## 2016-08-31 NOTE — Telephone Encounter (Signed)
rx

## 2016-09-12 DIAGNOSIS — I10 Essential (primary) hypertension: Secondary | ICD-10-CM | POA: Diagnosis not present

## 2016-09-12 DIAGNOSIS — H25812 Combined forms of age-related cataract, left eye: Secondary | ICD-10-CM | POA: Diagnosis not present

## 2016-09-12 DIAGNOSIS — H25811 Combined forms of age-related cataract, right eye: Secondary | ICD-10-CM | POA: Diagnosis not present

## 2016-09-12 DIAGNOSIS — Z961 Presence of intraocular lens: Secondary | ICD-10-CM | POA: Diagnosis not present

## 2016-09-12 DIAGNOSIS — H2512 Age-related nuclear cataract, left eye: Secondary | ICD-10-CM | POA: Diagnosis not present

## 2016-09-12 DIAGNOSIS — H5212 Myopia, left eye: Secondary | ICD-10-CM | POA: Diagnosis not present

## 2016-09-12 HISTORY — PX: EYE SURGERY: SHX253

## 2016-09-13 DIAGNOSIS — H2511 Age-related nuclear cataract, right eye: Secondary | ICD-10-CM | POA: Diagnosis not present

## 2016-10-03 DIAGNOSIS — H5212 Myopia, left eye: Secondary | ICD-10-CM | POA: Diagnosis not present

## 2016-10-03 DIAGNOSIS — H25811 Combined forms of age-related cataract, right eye: Secondary | ICD-10-CM | POA: Diagnosis not present

## 2016-10-03 DIAGNOSIS — H52223 Regular astigmatism, bilateral: Secondary | ICD-10-CM | POA: Diagnosis not present

## 2016-10-03 DIAGNOSIS — H2511 Age-related nuclear cataract, right eye: Secondary | ICD-10-CM | POA: Diagnosis not present

## 2016-10-03 DIAGNOSIS — Z961 Presence of intraocular lens: Secondary | ICD-10-CM | POA: Diagnosis not present

## 2016-10-03 DIAGNOSIS — I10 Essential (primary) hypertension: Secondary | ICD-10-CM | POA: Diagnosis not present

## 2016-10-03 HISTORY — PX: EYE SURGERY: SHX253

## 2016-10-03 HISTORY — PX: CATARACT EXTRACTION: SUR2

## 2016-10-14 ENCOUNTER — Ambulatory Visit (INDEPENDENT_AMBULATORY_CARE_PROVIDER_SITE_OTHER): Payer: Medicare Other | Admitting: Unknown Physician Specialty

## 2016-10-14 ENCOUNTER — Encounter: Payer: Self-pay | Admitting: Unknown Physician Specialty

## 2016-10-14 DIAGNOSIS — E7800 Pure hypercholesterolemia, unspecified: Secondary | ICD-10-CM

## 2016-10-14 DIAGNOSIS — I10 Essential (primary) hypertension: Secondary | ICD-10-CM

## 2016-10-14 DIAGNOSIS — N959 Unspecified menopausal and perimenopausal disorder: Secondary | ICD-10-CM | POA: Diagnosis not present

## 2016-10-14 NOTE — Assessment & Plan Note (Signed)
Recheck in 3 months.

## 2016-10-14 NOTE — Progress Notes (Signed)
BP 118/85 (BP Location: Left Arm, Patient Position: Sitting, Cuff Size: Normal)   Pulse 88   Temp 98.3 F (36.8 C)   Ht 5' 4.5" (1.638 m)   Wt 170 lb (77.1 kg)   LMP  (LMP Unknown)   SpO2 97%   BMI 28.73 kg/m    Subjective:    Patient ID: Janet Taylor, female    DOB: [birth year], 70 y.o.   MRN: [id number]  HPI: Janet Taylor is a 70 y.o. female  Chief Complaint  Patient presents with  . Hypertension  . Menopause   Hypertension  Using medications without difficulty  Average home BPs  120's-80's at home  Using medication without problems or lightheadedness No chest pain with exertion or shortness of breath No Edema  Menopause Has decreased use of the patch to 1 time a week. Still having night sweats, but no other problems or concerns.   Hyperlipidemia Pt states she has not worked on her diet and is not ready to recheck her cholesterol   Relevant past medical, surgical, family and social history reviewed and updated as indicated. Interim medical history since our last visit reviewed. Allergies and medications reviewed and updated.  Review of Systems  Constitutional: Negative.   Respiratory: Negative.   Cardiovascular: Negative.   Neurological: Negative.     Per HPI unless specifically indicated above     Objective:    BP 118/85 (BP Location: Left Arm, Patient Position: Sitting, Cuff Size: Normal)   Pulse 88   Temp 98.3 F (36.8 C)   Ht 5' 4.5" (1.638 m)   Wt 170 lb (77.1 kg)   LMP  (LMP Unknown)   SpO2 97%   BMI 28.73 kg/m   Wt Readings from Last 3 Encounters:  10/14/16 170 lb (77.1 kg)  08/24/16 169 lb 12.8 oz (77 kg)  04/13/16 167 lb (75.8 kg)    Physical Exam  Constitutional: She is oriented to person, place, and time. She appears well-developed and well-nourished. No distress.  Cardiovascular: Normal rate, regular rhythm and normal heart sounds.   Pulses:      Radial pulses are 2+ on the right side, and 2+ on the left side.       Dorsalis  pedis pulses are 2+ on the right side, and 2+ on the left side.  Pulmonary/Chest: Effort normal and breath sounds normal.  Musculoskeletal: She exhibits no edema.  Neurological: She is alert and oriented to person, place, and time.  Psychiatric: She has a normal mood and affect. Her behavior is normal. Judgment and thought content normal.    Results for orders placed or performed in visit on 04/13/16  Hepatitis C antibody  Result Value Ref Range   Hep C Virus Ab 0.1 0.0 - 0.9 s/co ratio  Comprehensive metabolic panel  Result Value Ref Range   Glucose 79 65 - 99 mg/dL   BUN 11 8 - 27 mg/dL   Creatinine, Ser 0.73 0.57 - 1.00 mg/dL   GFR calc non Af Amer 85 >59 mL/min/1.73   GFR calc Af Amer 98 >59 mL/min/1.73   BUN/Creatinine Ratio 15 12 - 28   Sodium 136 134 - 144 mmol/L   Potassium 3.8 3.5 - 5.2 mmol/L   Chloride 95 (L) 96 - 106 mmol/L   CO2 25 18 - 29 mmol/L   Calcium 9.6 8.7 - 10.3 mg/dL   Total Protein 6.9 6.0 - 8.5 g/dL   Albumin 4.4 3.6 - 4.8 g/dL   Globulin, Total  2.5 1.5 - 4.5 g/dL   Albumin/Globulin Ratio 1.8 1.2 - 2.2   Bilirubin Total 0.4 0.0 - 1.2 mg/dL   Alkaline Phosphatase 54 39 - 117 IU/L   AST 18 0 - 40 IU/L   ALT 18 0 - 32 IU/L  Lipid Panel w/o Chol/HDL Ratio  Result Value Ref Range   Cholesterol, Total 262 (H) 100 - 199 mg/dL   Triglycerides 80 0 - 149 mg/dL   HDL 63 >39 mg/dL   VLDL Cholesterol Cal 16 5 - 40 mg/dL   LDL Calculated 183 (H) 0 - 99 mg/dL      Assessment & Plan:   Problem List Items Addressed This Visit      Unprioritized   Hypertension    Stable, continue current medications.       Menopausal disorder    Stable, continue current regimen.       Other Visit Diagnoses   None.      Follow up plan: Return in about 3 months (around 01/14/2017) for lipid panel .

## 2016-10-14 NOTE — Assessment & Plan Note (Signed)
Stable, continue current medications.  

## 2016-10-14 NOTE — Assessment & Plan Note (Signed)
Stable, continue current regimen 

## 2017-01-11 ENCOUNTER — Other Ambulatory Visit: Payer: Self-pay | Admitting: Family Medicine

## 2017-01-13 ENCOUNTER — Encounter: Payer: Self-pay | Admitting: Unknown Physician Specialty

## 2017-01-13 ENCOUNTER — Ambulatory Visit (INDEPENDENT_AMBULATORY_CARE_PROVIDER_SITE_OTHER): Payer: Medicare Other | Admitting: Unknown Physician Specialty

## 2017-01-13 VITALS — BP 131/86 | HR 106 | Temp 97.7°F | Wt 168.6 lb

## 2017-01-13 DIAGNOSIS — Z0000 Encounter for general adult medical examination without abnormal findings: Secondary | ICD-10-CM

## 2017-01-13 DIAGNOSIS — N959 Unspecified menopausal and perimenopausal disorder: Secondary | ICD-10-CM

## 2017-01-13 DIAGNOSIS — E7800 Pure hypercholesterolemia, unspecified: Secondary | ICD-10-CM | POA: Diagnosis not present

## 2017-01-13 LAB — LIPID PANEL PICCOLO, WAIVED
CHOL/HDL RATIO PICCOLO,WAIVE: 3.7 mg/dL
CHOLESTEROL PICCOLO, WAIVED: 227 mg/dL — AB (ref <200)
HDL Chol Piccolo, Waived: 61 mg/dL (ref >59)
LDL CHOL CALC PICCOLO WAIVED: 147 mg/dL — AB (ref <100)
Triglycerides Piccolo,Waived: 92 mg/dL (ref <150)
VLDL CHOL CALC PICCOLO,WAIVE: 18 mg/dL (ref <30)

## 2017-01-13 MED ORDER — ASPIRIN 81 MG PO TABS: 81.0000 mg | ORAL_TABLET | Freq: Every day | ORAL | 12 refills | Status: DC

## 2017-01-13 NOTE — Assessment & Plan Note (Signed)
Off of hormone patch and doing well

## 2017-01-13 NOTE — Assessment & Plan Note (Signed)
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  She has brought her cholesterol down with diet and exercise.  Add an 81 mg ASA/day.  Refusing to try other statins at this time

## 2017-01-13 NOTE — Progress Notes (Signed)
BP 131/86 (BP Location: Left Arm, Patient Position: Sitting, Cuff Size: Normal)   Pulse (!) 106   Temp 97.7 F (36.5 C)   Wt 168 lb 9.6 oz (76.5 kg)   LMP  (LMP Unknown)   SpO2 97%   BMI 28.49 kg/m    Subjective:    Patient ID: Janet Taylor, female    DOB: 1946/02/07, 71 y.o.   MRN: [id number]  HPI: Janet Taylor is a 71 y.o. female  Chief Complaint  Patient presents with  . Hyperlipidemia  . Hypertension  . Menopause  . Orders    pt states she never recieved cologuard, enter another other if possible please   Hyperlipidemia Pt with high cholesterol. She has been working on lifestyle changes.  Fasting today  Menopause Weaned self off of hormone patch.    Relevant past medical, surgical, family and social history reviewed and updated as indicated. Interim medical history since our last visit reviewed. Allergies and medications reviewed and updated.  Review of Systems  Per HPI unless specifically indicated above     Objective:    BP 131/86 (BP Location: Left Arm, Patient Position: Sitting, Cuff Size: Normal)   Pulse (!) 106   Temp 97.7 F (36.5 C)   Wt 168 lb 9.6 oz (76.5 kg)   LMP  (LMP Unknown)   SpO2 97%   BMI 28.49 kg/m   Wt Readings from Last 3 Encounters:  01/13/17 168 lb 9.6 oz (76.5 kg)  10/14/16 170 lb (77.1 kg)  08/24/16 169 lb 12.8 oz (77 kg)    Physical Exam  Constitutional: She is oriented to person, place, and time. She appears well-developed and well-nourished. No distress.  HENT:  Head: Normocephalic and atraumatic.  Eyes: Conjunctivae and lids are normal. Right eye exhibits no discharge. Left eye exhibits no discharge. No scleral icterus.  Neck: Normal range of motion. Neck supple. No JVD present. Carotid bruit is not present.  Cardiovascular: Normal rate, regular rhythm and normal heart sounds.   Pulmonary/Chest: Effort normal and breath sounds normal.  Abdominal: Normal appearance. There is no splenomegaly or hepatomegaly.    Musculoskeletal: Normal range of motion.  Neurological: She is alert and oriented to person, place, and time.  Skin: Skin is warm, dry and intact. No rash noted. No pallor.  Psychiatric: She has a normal mood and affect. Her behavior is normal. Judgment and thought content normal.    Results for orders placed or performed in visit on 04/13/16  Hepatitis C antibody  Result Value Ref Range   Hep C Virus Ab 0.1 0.0 - 0.9 s/co ratio  Comprehensive metabolic panel  Result Value Ref Range   Glucose 79 65 - 99 mg/dL   BUN 11 8 - 27 mg/dL   Creatinine, Ser 0.73 0.57 - 1.00 mg/dL   GFR calc non Af Amer 85 >59 mL/min/1.73   GFR calc Af Amer 98 >59 mL/min/1.73   BUN/Creatinine Ratio 15 12 - 28   Sodium 136 134 - 144 mmol/L   Potassium 3.8 3.5 - 5.2 mmol/L   Chloride 95 (L) 96 - 106 mmol/L   CO2 25 18 - 29 mmol/L   Calcium 9.6 8.7 - 10.3 mg/dL   Total Protein 6.9 6.0 - 8.5 g/dL   Albumin 4.4 3.6 - 4.8 g/dL   Globulin, Total 2.5 1.5 - 4.5 g/dL   Albumin/Globulin Ratio 1.8 1.2 - 2.2   Bilirubin Total 0.4 0.0 - 1.2 mg/dL   Alkaline Phosphatase 54 39 -  117 IU/L   AST 18 0 - 40 IU/L   ALT 18 0 - 32 IU/L  Lipid Panel w/o Chol/HDL Ratio  Result Value Ref Range   Cholesterol, Total 262 (H) 100 - 199 mg/dL   Triglycerides 80 0 - 149 mg/dL   HDL 63 >39 mg/dL   VLDL Cholesterol Cal 16 5 - 40 mg/dL   LDL Calculated 183 (H) 0 - 99 mg/dL      Assessment & Plan:   Problem List Items Addressed This Visit      Unprioritized   Hyperlipidemia - Primary    Pt with myopathy to statins.  She has brought her cholesterol down with diet and exercise.  Add an 81 mg ASA/day.  Refusing to try other statins at this time      Relevant Medications   aspirin 81 MG tablet   Other Relevant Orders   Lipid Panel Piccolo, Waived   Menopausal disorder    Off of hormone patch and doing well       Other Visit Diagnoses    Routine general medical examination at a health care facility       Relevant Orders    Cologuard       Follow up plan: Return in about 6 months (around 07/13/2017) for physical.

## 2017-03-02 ENCOUNTER — Other Ambulatory Visit: Payer: Self-pay | Admitting: Unknown Physician Specialty

## 2017-04-07 ENCOUNTER — Telehealth: Payer: Self-pay | Admitting: Unknown Physician Specialty

## 2017-04-07 MED ORDER — HYDROCORTISONE 2.5 % EX CREA: TOPICAL_CREAM | Freq: Two times a day (BID) | CUTANEOUS | 2 refills | Status: DC

## 2017-04-07 MED ORDER — TRIAMCINOLONE ACETONIDE 0.1 % EX CREA: 1.0000 "application " | TOPICAL_CREAM | Freq: Two times a day (BID) | CUTANEOUS | 0 refills | Status: DC

## 2017-04-07 NOTE — Telephone Encounter (Signed)
done

## 2017-04-07 NOTE — Telephone Encounter (Signed)
Called and let patient know that the hydrocortisone 2.5% cream was sent in for her.

## 2017-04-07 NOTE — Telephone Encounter (Signed)
Routing to provider  

## 2017-04-07 NOTE — Telephone Encounter (Signed)
Patient would like to have hydrocortisone cream called in to [facility]  She said it was for her eczema.  [facility]  She can be reached @ [phone]

## 2017-04-07 NOTE — Telephone Encounter (Signed)
Called and let patient know about medication being sent in. Patient asked what was sent in, I told her, and she states that this does not work for her. She states she has been given hydrocortizone cream 2.5% in the past and this is what works for her. Can we send in this cream for her instead (I added it into the medication list)?

## 2017-05-07 ENCOUNTER — Emergency Department
Admission: EM | Admit: 2017-05-07 | Discharge: 2017-05-07 | Disposition: A | Discharge status: Home / Self Care | Payer: Medicare Other | Attending: Emergency Medicine | Admitting: Emergency Medicine

## 2017-05-07 ENCOUNTER — Encounter: Payer: Self-pay | Admitting: Emergency Medicine

## 2017-05-07 DIAGNOSIS — Y9301 Activity, walking, marching and hiking: Secondary | ICD-10-CM | POA: Insufficient documentation

## 2017-05-07 DIAGNOSIS — S0993XA Unspecified injury of face, initial encounter: Secondary | ICD-10-CM | POA: Insufficient documentation

## 2017-05-07 DIAGNOSIS — Y92009 Unspecified place in unspecified non-institutional (private) residence as the place of occurrence of the external cause: Secondary | ICD-10-CM | POA: Diagnosis not present

## 2017-05-07 DIAGNOSIS — Y999 Unspecified external cause status: Secondary | ICD-10-CM | POA: Diagnosis not present

## 2017-05-07 DIAGNOSIS — Z7982 Long term (current) use of aspirin: Secondary | ICD-10-CM | POA: Insufficient documentation

## 2017-05-07 DIAGNOSIS — W19XXXA Unspecified fall, initial encounter: Secondary | ICD-10-CM

## 2017-05-07 DIAGNOSIS — H1132 Conjunctival hemorrhage, left eye: Secondary | ICD-10-CM

## 2017-05-07 DIAGNOSIS — I10 Essential (primary) hypertension: Secondary | ICD-10-CM | POA: Insufficient documentation

## 2017-05-07 DIAGNOSIS — Z79899 Other long term (current) drug therapy: Secondary | ICD-10-CM | POA: Diagnosis not present

## 2017-05-07 DIAGNOSIS — W108XXA Fall (on) (from) other stairs and steps, initial encounter: Secondary | ICD-10-CM | POA: Diagnosis not present

## 2017-05-07 DIAGNOSIS — H02844 Edema of left upper eyelid: Secondary | ICD-10-CM

## 2017-05-07 NOTE — Discharge Instructions (Signed)
Please seek medical attention for any high fevers, chest pain, shortness of breath, change in behavior, persistent vomiting, bloody stool or any other new or concerning symptoms.  

## 2017-05-07 NOTE — ED Notes (Signed)
FIRST NURSE NOTE: Pt reports a fall on Friday morning, ambulatory in lobby without difficulty.

## 2017-05-07 NOTE — ED Provider Notes (Signed)
[facility] Emergency Department Provider Note   ____________________________________________   I have reviewed the triage vital signs and the nursing notes.   HISTORY  Chief Complaint Fall and Facial Injury   History limited by: Not Limited   HPI Janet Taylor is a 71 y.o. female who presents to the emergency department today because of concerns for eye swelling. The patient fell at her house 2 days ago. She states she fell down a couple of steps into her garage. She is unsure if she hit her head on her car on the floor. She did have some subsequent left eye swelling and bruising. She has been treating this with ice. This morning however she noticed some bruising below her right eye. She denies any change in vision. She denies any significant pain. She denies being on any blood thinners.   Past Medical History:  Diagnosis Date  . Eczema herpeticum   . Hyperlipidemia   . Hypertension   . Menopausal disorder   . Osteopenia     Patient Active Problem List   Diagnosis Date Noted  . Osteopenia 03/22/2016  . Hypertension 03/22/2016  . Hyperlipidemia 03/22/2016  . Eczema herpeticum 03/22/2016  . Menopausal disorder 03/22/2016    Past Surgical History:  Procedure Laterality Date  . ABDOMINAL HYSTERECTOMY     complete  . BREAST BIOPSY Right 2015   NEG  . CATARACT EXTRACTION Right 10/03/2016  . cataract surgery Left    09/12/16    Prior to Admission medications   Medication Sig Start Date End Date Taking? Authorizing Provider  hydrochlorothiazide (HYDRODIURIL) 25 MG tablet TAKE 1 TABLET BY MOUTH EVERY DAY 03/03/17  Yes Kathrine Haddock, NP  hydrocortisone 2.5 % cream Apply topically 2 (two) times daily. 04/07/17  Yes Kathrine Haddock, NP  metoprolol (LOPRESSOR) 50 MG tablet TAKE 1 TABLET BY MOUTH EVERY DAY 01/11/17  Yes Kathrine Haddock, NP  triamcinolone cream (KENALOG) 0.1 % Apply 1 application topically 2 (two) times daily. 04/07/17  Yes Kathrine Haddock, NP   aspirin 81 MG tablet Take 1 tablet (81 mg total) by mouth daily. Patient not taking: Reported on 05/07/2017 01/13/17   Kathrine Haddock, NP    Allergies Hydrocodone; Lipitor [atorvastatin]; Morphine and related; and Zetia [ezetimibe]  Family History  Problem Relation Age of Onset  . Hypertension Father   . Hypertension Sister   . Breast cancer Neg Hx     Social History Social History  Substance Use Topics  . Smoking status: Never Smoker  . Smokeless tobacco: Never Used  . Alcohol use 0.0 oz/week     Comment: on weekends    Review of Systems Constitutional: No fever/chills Eyes: Swelling to left eye. ENT: No sore throat. Cardiovascular: Denies chest pain. Respiratory: Denies shortness of breath. Gastrointestinal: No abdominal pain.  No nausea, no vomiting.  No diarrhea.   Genitourinary: Negative for dysuria. Musculoskeletal: Negative for back pain. Skin: Negative for rash. Neurological: Negative for headaches, focal weakness or numbness.  ____________________________________________   PHYSICAL EXAM:  VITAL SIGNS: ED Triage Vitals [05/07/17 1009]  Enc Vitals Group     BP (!) 174/86     Pulse Rate 80     Resp 20     Temp 97.7 F (36.5 C)     Temp Source Oral     SpO2 99 %     Weight 160 lb (72.6 kg)     Height 5\' 4"  (1.626 m)    Constitutional: Alert and oriented. Well appearing and in  no distress. Eyes: Right eye conjunctiva normal. Left eye with obvious subconjunctival hemorrhage. Significant left upper eyelid swelling. No proptoses. EOMI. PERRL.  ENT   Head: Normocephalic. No hemotympanum. No battle sign.   Nose: No congestion/rhinnorhea.   Mouth/Throat: Mucous membranes are moist.   Neck: No stridor. Hematological/Lymphatic/Immunilogical: No cervical lymphadenopathy. Cardiovascular: Normal rate, regular rhythm.  No murmurs, rubs, or gallops.  Respiratory: Normal respiratory effort without tachypnea nor retractions. Breath sounds are clear and  equal bilaterally. No wheezes/rales/rhonchi. Gastrointestinal: Soft and non tender. No rebound. No guarding.  Genitourinary: Deferred Musculoskeletal: Normal range of motion in all extremities.  Neurologic:  Normal speech and language. No gross focal neurologic deficits are appreciated.  Skin:  Skin is warm, dry and intact. No rash noted. Psychiatric: Mood and affect are normal. Speech and behavior are normal. Patient exhibits appropriate insight and judgment.  ____________________________________________    LABS (pertinent positives/negatives)  None  ____________________________________________   EKG  None  ____________________________________________    RADIOLOGY  None   ____________________________________________   PROCEDURES  Procedures  ____________________________________________   INITIAL IMPRESSION / ASSESSMENT AND PLAN / ED COURSE  Pertinent labs & imaging results that were available during my care of the patient were reviewed by me and considered in my medical decision making (see chart for details).  Patient presented to the emergency department today because of concerns for swelling and bruising about her eyes being patient does have significant swelling to the left eyelid however no signs or symptoms concerning for retroorbital hematoma. At this point do not think any emergent imaging necessary. Discussed conservative measures. Will discharge home.  ____________________________________________   FINAL CLINICAL IMPRESSION(S) / ED DIAGNOSES  Final diagnoses:  Fall, initial encounter  Subconjunctival hemorrhage of left eye  Swelling of left upper eyelid     Note: This dictation was prepared with Dragon dictation. Any transcriptional errors that result from this process are unintentional     Nance Pear, MD 05/07/17 1122

## 2017-05-07 NOTE — ED Triage Notes (Signed)
Friday morning, fell onto concrete garage floor.  Patient states she has three wooden steps from laundry room to garage.  Patient denies LOC.  Has been icing left eye.  Patient unsure where she hit on her fact.  Left eye swollen closed and ecchymotic.  Today patient states swelling improved to left eye, but ecchymosis and swelling now seen under right eye.  Denies pain.  AAOx3.  Skin warm and dry. MAE equally and strong.

## 2017-05-12 ENCOUNTER — Ambulatory Visit (INDEPENDENT_AMBULATORY_CARE_PROVIDER_SITE_OTHER): Payer: Medicare Other | Admitting: Unknown Physician Specialty

## 2017-05-12 ENCOUNTER — Encounter: Payer: Self-pay | Admitting: Unknown Physician Specialty

## 2017-05-12 DIAGNOSIS — H1132 Conjunctival hemorrhage, left eye: Secondary | ICD-10-CM | POA: Diagnosis not present

## 2017-05-12 NOTE — Progress Notes (Signed)
   BP 124/85   Pulse 69   Temp 97.7 F (36.5 C)   Wt 167 lb 3.2 oz (75.8 kg)   LMP  (LMP Unknown)   SpO2 98%   BMI 28.70 kg/m    Subjective:    Patient ID: Janet Taylor, female    DOB: [birth year], 71 y.o.   MRN: [id number]  HPI: Janet Taylor is a 71 y.o. female  Chief Complaint  Patient presents with  . ER Follow Up    pt states she had a fall last Friday and went to the ER on Sunday. Pt states she bruised both of her eyes, left more than the right   Pt presented to the ER on 5/27 following a fall in which she fell down a couple of steps into her garage.  She hit her head on the caor or the floor.  She did have some swelling of left eye and bruising.  She showed me a picture in which she had significant swelling.  She is better but is concerned about the redness in her conjunctiva.  She has no visual changes or sensitivity to light.    Relevant past medical, surgical, family and social history reviewed and updated as indicated. Interim medical history since our last visit reviewed. Allergies and medications reviewed and updated.  Review of Systems  Per HPI unless specifically indicated above     Objective:    BP 124/85   Pulse 69   Temp 97.7 F (36.5 C)   Wt 167 lb 3.2 oz (75.8 kg)   LMP  (LMP Unknown)   SpO2 98%   BMI 28.70 kg/m   Wt Readings from Last 3 Encounters:  05/12/17 167 lb 3.2 oz (75.8 kg)  05/07/17 160 lb (72.6 kg)  01/13/17 168 lb 9.6 oz (76.5 kg)    Physical Exam  Constitutional: She is oriented to person, place, and time. She appears well-developed and well-nourished. No distress.  HENT:  Head: Normocephalic and atraumatic.  Eyes: Right eye exhibits no discharge. Left eye exhibits no discharge. Right conjunctiva has a hemorrhage. No scleral icterus.  Left eye with periorbital swelling.  Conjuctival hemorrhage  Neck: Normal range of motion. Neck supple. No JVD present. Carotid bruit is not present.  Abdominal: Normal appearance. There is no  splenomegaly or hepatomegaly.  Musculoskeletal: Normal range of motion.  Neurological: She is alert and oriented to person, place, and time.  Skin: Skin is warm, dry and intact. No rash noted. No pallor.  Psychiatric: She has a normal mood and affect. Her behavior is normal. Judgment and thought content normal.    Results for orders placed or performed in visit on 01/13/17  Lipid Panel Piccolo, [employer]  Result Value Ref Range   Cholesterol Piccolo, Waived 227 (H) <200 mg/dL   HDL Chol Piccolo, Waived 61 >59 mg/dL   Triglycerides Piccolo,Waived 92 <150 mg/dL   Chol/HDL Ratio Piccolo,Waive 3.7 mg/dL   LDL Chol Calc Piccolo Waived 147 (H) <100 mg/dL   VLDL Chol Calc Piccolo,Waive 18 <30 mg/dL      Assessment & Plan:   Problem List Items Addressed This Visit      Unprioritized   Conjunctival hemorrhage of left eye    Reassured that with no visual problems, this should resolve on its own.  Stop ASA until resolved          Follow up plan: Return if symptoms worsen or fail to improve.

## 2017-05-12 NOTE — Assessment & Plan Note (Addendum)
Reassured that with no visual problems, this should resolve on its own.  Stop ASA until resolved

## 2017-05-15 DIAGNOSIS — I10 Essential (primary) hypertension: Secondary | ICD-10-CM | POA: Diagnosis not present

## 2017-05-15 DIAGNOSIS — H1132 Conjunctival hemorrhage, left eye: Secondary | ICD-10-CM | POA: Diagnosis not present

## 2017-05-15 DIAGNOSIS — H04123 Dry eye syndrome of bilateral lacrimal glands: Secondary | ICD-10-CM | POA: Diagnosis not present

## 2017-05-15 DIAGNOSIS — Z961 Presence of intraocular lens: Secondary | ICD-10-CM | POA: Diagnosis not present

## 2017-05-15 DIAGNOSIS — R233 Spontaneous ecchymoses: Secondary | ICD-10-CM | POA: Diagnosis not present

## 2017-06-29 ENCOUNTER — Other Ambulatory Visit: Payer: Self-pay | Admitting: Unknown Physician Specialty

## 2017-06-29 ENCOUNTER — Telehealth: Payer: Self-pay | Admitting: Unknown Physician Specialty

## 2017-06-29 DIAGNOSIS — Z1231 Encounter for screening mammogram for malignant neoplasm of breast: Secondary | ICD-10-CM

## 2017-07-19 ENCOUNTER — Other Ambulatory Visit: Payer: Self-pay | Admitting: Unknown Physician Specialty

## 2017-07-26 ENCOUNTER — Ambulatory Visit (INDEPENDENT_AMBULATORY_CARE_PROVIDER_SITE_OTHER): Payer: Medicare Other

## 2017-07-26 VITALS — BP 127/86 | HR 64 | Temp 97.4°F | Resp 16 | Ht 64.0 in | Wt 171.8 lb

## 2017-07-26 DIAGNOSIS — Z0000 Encounter for general adult medical examination without abnormal findings: Secondary | ICD-10-CM

## 2017-07-26 NOTE — Progress Notes (Signed)
Subjective:   Janet Taylor is a 71 y.o. female who presents for Medicare Annual (Subsequent) preventive examination.  Review of Systems:  Cardiac Risk Factors include: advanced age (>29men, >56 women);hypertension;dyslipidemia     Objective:     Vitals: BP 127/86 (BP Location: Left Arm, Patient Position: Sitting)   Pulse 64   Temp (!) 97.4 F (36.3 C)   Resp 16   Ht 5\' 4"  (1.626 m)   Wt 171 lb 12.8 oz (77.9 kg)   LMP  (LMP Unknown)   BMI 29.49 kg/m   Body mass index is 29.49 kg/m.   Tobacco History  Smoking Status  . Never Smoker  Smokeless Tobacco  . Never Used     Counseling given: Not Answered   Past Medical History:  Diagnosis Date  . Eczema herpeticum   . Hyperlipidemia   . Hypertension   . Menopausal disorder   . Osteopenia    Past Surgical History:  Procedure Laterality Date  . ABDOMINAL HYSTERECTOMY     complete  . BREAST BIOPSY Right 2015   NEG  . CATARACT EXTRACTION Right 10/03/2016  . cataract surgery Left    09/12/16  . EYE SURGERY Bilateral 09/12/2016   bilateral cataract surgery   . EYE SURGERY  10/03/2016   Family History  Problem Relation Age of Onset  . Hypertension Father   . Hypertension Sister   . Breast cancer Neg Hx    History  Sexual Activity  . Sexual activity: No    Outpatient Encounter Prescriptions as of 07/26/2017  Medication Sig  . hydrochlorothiazide (HYDRODIURIL) 25 MG tablet TAKE 1 TABLET BY MOUTH EVERY DAY  . hydrocortisone 2.5 % cream Apply topically 2 (two) times daily.  . metoprolol tartrate (LOPRESSOR) 50 MG tablet TAKE 1 TABLET BY MOUTH EVERY DAY  . aspirin 81 MG tablet Take 1 tablet (81 mg total) by mouth daily. (Patient not taking: Reported on 07/26/2017)  . triamcinolone cream (KENALOG) 0.1 % Apply 1 application topically 2 (two) times daily. (Patient not taking: Reported on 07/26/2017)   No facility-administered encounter medications on file as of 07/26/2017.     Activities of Daily Living In your  present state of health, do you have any difficulty performing the following activities: 07/26/2017  Hearing? N  Vision? N  Difficulty concentrating or making decisions? N  Walking or climbing stairs? N  Dressing or bathing? N  Doing errands, shopping? N  Preparing Food and eating ? N  Using the Toilet? N  In the past six months, have you accidently leaked urine? N  Do you have problems with loss of bowel control? N  Managing your Medications? N  Managing your Finances? N  Housekeeping or managing your Housekeeping? N  Some recent data might be hidden    Patient Care Team: Kathrine Haddock, NP as PCP - General (Nurse Practitioner) Idelle Leech, OD (Optometry)    Assessment:     Exercise Activities and Dietary recommendations Current Exercise Habits: Home exercise routine, Time (Minutes): 45, Frequency (Times/Week): 5, Weekly Exercise (Minutes/Week): 225, Intensity: Mild, Exercise limited by: None identified  Goals    None     Fall Risk Fall Risk  07/26/2017 05/12/2017 04/13/2016 12/04/2015  Falls in the past year? Yes Yes No No  Number falls in past yr: 1 1 - -  Injury with Fall? Yes Yes - -  Follow up Falls prevention discussed - - -   Depression Screen PHQ 2/9 Scores 07/26/2017 05/12/2017 04/13/2016  PHQ - 2 Score 0 0 0     Cognitive Function     6CIT Screen 07/26/2017  What Year? 0 points  What month? 0 points  What time? 0 points  Count back from 20 0 points  Months in reverse 0 points  Repeat phrase 0 points  Total Score 0    Immunization History  Administered Date(s) Administered  . Pneumococcal Conjugate-13 04/02/2014  . Pneumococcal Polysaccharide-23 04/13/2016  . Td 04/14/2004  . Zoster 11/17/2008   Screening Tests Health Maintenance  Topic Date Due  . INFLUENZA VACCINE  07/12/2017  . Fecal DNA (Cologuard)  09/11/2017 (Originally 05/31/1997)  . TETANUS/TDAP  07/26/2018 (Originally 04/14/2014)  . MAMMOGRAM  06/02/2018  . DEXA SCAN  Completed  . Hepatitis  C Screening  Completed  . PNA vac Low Risk Adult  Completed      Plan:     I have personally reviewed and addressed the Medicare Annual Wellness questionnaire and have noted the following in the patient's chart:  A. Medical and social history B. Use of alcohol, tobacco or illicit drugs  C. Current medications and supplements D. Functional ability and status E.  Nutritional status F.  Physical activity G. Advance directives H. List of other physicians I.  Hospitalizations, surgeries, and ER visits in previous 12 months J.  [place] such as hearing and vision if needed, cognitive and depression L. Referrals and appointments   In addition, I have reviewed and discussed with patient certain preventive protocols, quality metrics, and best practice recommendations. A written personalized care plan for preventive services as well as general preventive health recommendations were provided to patient.   Signed,  Tyler Aas, LPN Nurse Health Advisor   MD Recommendations: patient still has some swelling and numbness around her left eye from injury in may 2018.

## 2017-07-26 NOTE — Patient Instructions (Signed)
Janet Taylor , Thank you for taking time to come for your Medicare Wellness Visit. I appreciate your ongoing commitment to your health goals. Please review the following plan we discussed and let me know if I can assist you in the future.   Screening recommendations/referrals: Colonoscopy: due now- ordered cologuard today  Mammogram: due now - scheduled for tomorrow at 9am  Bone Density: completed 02/20/2013 Recommended yearly ophthalmology/optometry visit for glaucoma screening and checkup Recommended yearly dental visit for hygiene and checkup  Vaccinations: Influenza vaccine: due 08/2017 Pneumococcal vaccine: up to date Tdap vaccine: due, check with your insurance company for coverage Shingles vaccine: up to date  Advanced directives: Please bring a copy of your health care power of attorney and living will to the office at your convenience.  Conditions/risks identified: none   Next appointment:  Follow up on 08/04/2017 at 9:00am with Regino Schultze.  Follow up in one year for your annual wellness exam.    Preventive Care 65 Years and Older, Female Preventive care refers to lifestyle choices and visits with your health care provider that can promote health and wellness. What does preventive care include?  A yearly physical exam. This is also called an annual well check.  Dental exams once or twice a year.  Routine eye exams. Ask your health care provider how often you should have your eyes checked.  Personal lifestyle choices, including:  Daily care of your teeth and gums.  Regular physical activity.  Eating a healthy diet.  Avoiding tobacco and drug use.  Limiting alcohol use.  Practicing safe sex.  Taking low-dose aspirin every day.  Taking vitamin and mineral supplements as recommended by your health care provider. What happens during an annual well check? The services and screenings done by your health care provider during your annual well check will depend on  your age, overall health, lifestyle risk factors, and family history of disease. Counseling  Your health care provider may ask you questions about your:  Alcohol use.  Tobacco use.  Drug use.  Emotional well-being.  Home and relationship well-being.  Sexual activity.  Eating habits.  History of falls.  Memory and ability to understand (cognition).  Work and work Statistician.  Reproductive health. Screening  You may have the following tests or measurements:  Height, weight, and BMI.  Blood pressure.  Lipid and cholesterol levels. These may be checked every 5 years, or more frequently if you are over 56 years old.  Skin check.  Lung cancer screening. You may have this screening every year starting at age 61 if you have a 30-pack-year history of smoking and currently smoke or have quit within the past 15 years.  Fecal occult blood test (FOBT) of the stool. You may have this test every year starting at age 38.  Flexible sigmoidoscopy or colonoscopy. You may have a sigmoidoscopy every 5 years or a colonoscopy every 10 years starting at age 57.  Hepatitis C blood test.  Hepatitis B blood test.  Sexually transmitted disease (STD) testing.  Diabetes screening. This is done by checking your blood sugar (glucose) after you have not eaten for a while (fasting). You may have this done every 1-3 years.  Bone density scan. This is done to screen for osteoporosis. You may have this done starting at age 32.  Mammogram. This may be done every 1-2 years. Talk to your health care provider about how often you should have regular mammograms. Talk with your health care provider about your test results,  treatment options, and if necessary, the need for more tests. Vaccines  Your health care provider may recommend certain vaccines, such as:  Influenza vaccine. This is recommended every year.  Tetanus, diphtheria, and acellular pertussis (Tdap, Td) vaccine. You may need a Td booster  every 10 years.  Zoster vaccine. You may need this after age 38.  Pneumococcal 13-valent conjugate (PCV13) vaccine. One dose is recommended after age 11.  Pneumococcal polysaccharide (PPSV23) vaccine. One dose is recommended after age 61. Talk to your health care provider about which screenings and vaccines you need and how often you need them. This information is not intended to replace advice given to you by your health care provider. Make sure you discuss any questions you have with your health care provider. Document Released: 12/25/2015 Document Revised: 08/17/2016 Document Reviewed: 09/29/2015 Elsevier Interactive Patient Education  2017 [place] Prevention in the Home Falls can cause injuries. They can happen to people of all ages. There are many things you can do to make your home safe and to help prevent falls. What can I do on the outside of my home?  Regularly fix the edges of walkways and driveways and fix any cracks.  Remove anything that might make you trip as you walk through a door, such as a raised step or threshold.  Trim any bushes or trees on the path to your home.  Use bright outdoor lighting.  Clear any walking paths of anything that might make someone trip, such as rocks or tools.  Regularly check to see if handrails are loose or broken. Make sure that both sides of any steps have handrails.  Any raised decks and porches should have guardrails on the edges.  Have any leaves, snow, or ice cleared regularly.  Use sand or salt on walking paths during winter.  Clean up any spills in your garage right away. This includes oil or grease spills. What can I do in the bathroom?  Use night lights.  Install grab bars by the toilet and in the tub and shower. Do not use towel bars as grab bars.  Use non-skid mats or decals in the tub or shower.  If you need to sit down in the shower, use a plastic, non-slip stool.  Keep the floor dry. Clean up any  water that spills on the floor as soon as it happens.  Remove soap buildup in the tub or shower regularly.  Attach bath mats securely with double-sided non-slip rug tape.  Do not have throw rugs and other things on the floor that can make you trip. What can I do in the bedroom?  Use night lights.  Make sure that you have a light by your bed that is easy to reach.  Do not use any sheets or blankets that are too big for your bed. They should not hang down onto the floor.  Have a firm chair that has side arms. You can use this for support while you get dressed.  Do not have throw rugs and other things on the floor that can make you trip. What can I do in the kitchen?  Clean up any spills right away.  Avoid walking on wet floors.  Keep items that you use a lot in easy-to-reach places.  If you need to reach something above you, use a strong step stool that has a grab bar.  Keep electrical cords out of the way.  Do not use floor polish or wax that makes floors  slippery. If you must use wax, use non-skid floor wax.  Do not have throw rugs and other things on the floor that can make you trip. What can I do with my stairs?  Do not leave any items on the stairs.  Make sure that there are handrails on both sides of the stairs and use them. Fix handrails that are broken or loose. Make sure that handrails are as long as the stairways.  Check any carpeting to make sure that it is firmly attached to the stairs. Fix any carpet that is loose or worn.  Avoid having throw rugs at the top or bottom of the stairs. If you do have throw rugs, attach them to the floor with carpet tape.  Make sure that you have a light switch at the top of the stairs and the bottom of the stairs. If you do not have them, ask someone to add them for you. What else can I do to help prevent falls?  Wear shoes that:  Do not have high heels.  Have rubber bottoms.  Are comfortable and fit you well.  Are closed  at the toe. Do not wear sandals.  If you use a stepladder:  Make sure that it is fully opened. Do not climb a closed stepladder.  Make sure that both sides of the stepladder are locked into place.  Ask someone to hold it for you, if possible.  Clearly mark and make sure that you can see:  Any grab bars or handrails.  First and last steps.  Where the edge of each step is.  Use tools that help you move around (mobility aids) if they are needed. These include:  Canes.  Walkers.  Scooters.  Crutches.  Turn on the lights when you go into a dark area. Replace any light bulbs as soon as they burn out.  Set up your furniture so you have a clear path. Avoid moving your furniture around.  If any of your floors are uneven, fix them.  If there are any pets around you, be aware of where they are.  Review your medicines with your doctor. Some medicines can make you feel dizzy. This can increase your chance of falling. Ask your doctor what other things that you can do to help prevent falls. This information is not intended to replace advice given to you by your health care provider. Make sure you discuss any questions you have with your health care provider. Document Released: 09/24/2009 Document Revised: 05/05/2016 Document Reviewed: 01/02/2015 Elsevier Interactive Patient Education  2017 [employer].

## 2017-07-27 ENCOUNTER — Ambulatory Visit
Admission: RE | Admit: 2017-07-27 | Discharge: 2017-07-27 | Disposition: A | Discharge status: Home / Self Care | Payer: Medicare Other | Source: Ambulatory Visit | Attending: Unknown Physician Specialty | Admitting: Unknown Physician Specialty

## 2017-07-27 DIAGNOSIS — Z1231 Encounter for screening mammogram for malignant neoplasm of breast: Secondary | ICD-10-CM | POA: Diagnosis not present

## 2017-08-04 ENCOUNTER — Encounter: Payer: Self-pay | Admitting: Unknown Physician Specialty

## 2017-08-04 ENCOUNTER — Ambulatory Visit (INDEPENDENT_AMBULATORY_CARE_PROVIDER_SITE_OTHER): Payer: Medicare Other | Admitting: Unknown Physician Specialty

## 2017-08-04 DIAGNOSIS — I10 Essential (primary) hypertension: Secondary | ICD-10-CM | POA: Diagnosis not present

## 2017-08-04 DIAGNOSIS — E7800 Pure hypercholesterolemia, unspecified: Secondary | ICD-10-CM | POA: Diagnosis not present

## 2017-08-04 NOTE — Assessment & Plan Note (Signed)
Stable, continue present medications.   

## 2017-08-04 NOTE — Progress Notes (Signed)
BP 128/85   Pulse 78   Temp (!) 97.5 F (36.4 C)   Ht 5\' 4"  (1.626 m)   Wt 170 lb 3.2 oz (77.2 kg)   LMP  (LMP Unknown)   SpO2 98%   BMI 29.21 kg/m    Subjective:    Patient ID: Janet Taylor, female    DOB: [birth year], 71 y.o.   MRN: [id number]  HPI: Janet Taylor is a 71 y.o. female  Chief Complaint  Patient presents with  . Annual Exam    pt had wellness exam 07/26/17 with [facility]   Wellness visit by [facility] reviewed.    Hypertension Using medications without difficulty Average home BPs 120-134/84.    No problems or lightheadedness No chest pain with exertion or shortness of breath No Edema  The 10-year ASCVD risk score Mikey Bussing DC Jr., et al., 2013) is: 13.5%   Values used to calculate the score:     Age: 18 years     Sex: Female     Is Non-Hispanic African American: Yes     Diabetic: No     Tobacco smoker: No     Systolic Blood Pressure: 782 mmHg     Is BP treated: Yes     HDL Cholesterol: 61 mg/dL     Total Cholesterol: 227 mg/dL  Note elevated risk.  Shared decision making with pt.  She refuses statin medication at this time.    Relevant past medical, surgical, family and social history reviewed and updated as indicated. Interim medical history since our last visit reviewed. Allergies and medications reviewed and updated.  Review of Systems  Constitutional: Negative.   HENT: Negative.   Eyes: Negative.   Respiratory: Negative.   Cardiovascular: Negative.   Gastrointestinal: Negative.   Endocrine: Negative.   Genitourinary: Negative.   Musculoskeletal: Negative.   Skin: Negative.   Allergic/Immunologic: Negative.   Neurological: Negative.   Hematological: Negative.   Psychiatric/Behavioral: Negative.     Per HPI unless specifically indicated above     Objective:    BP 128/85   Pulse 78   Temp (!) 97.5 F (36.4 C)   Ht 5\' 4"  (1.626 m)   Wt 170 lb 3.2 oz (77.2 kg)   LMP  (LMP Unknown)   SpO2 98%   BMI 29.21 kg/m   Wt Readings from Last 3  Encounters:  08/04/17 170 lb 3.2 oz (77.2 kg)  07/26/17 171 lb 12.8 oz (77.9 kg)  05/12/17 167 lb 3.2 oz (75.8 kg)    Physical Exam  Constitutional: She is oriented to person, place, and time. She appears well-developed and well-nourished. No distress.  HENT:  Head: Normocephalic and atraumatic.  Eyes: Conjunctivae and lids are normal. Right eye exhibits no discharge. Left eye exhibits no discharge. No scleral icterus.  Neck: Normal range of motion. Neck supple. No JVD present. Carotid bruit is not present.  Cardiovascular: Normal rate, regular rhythm and normal heart sounds.   Pulmonary/Chest: Effort normal and breath sounds normal.  Abdominal: Normal appearance. There is no splenomegaly or hepatomegaly.  Musculoskeletal: Normal range of motion.  Neurological: She is alert and oriented to person, place, and time.  Skin: Skin is warm, dry and intact. No rash noted. No pallor.  Psychiatric: She has a normal mood and affect. Her behavior is normal. Judgment and thought content normal.   Refusing breast exam  Results for orders placed or performed in visit on 01/13/17  Lipid Panel Piccolo, [place]  Result Value Ref Range  Cholesterol Piccolo, Waived 227 (H) <200 mg/dL   HDL Chol Piccolo, Waived 61 >59 mg/dL   Triglycerides Piccolo,Waived 92 <150 mg/dL   Chol/HDL Ratio Piccolo,Waive 3.7 mg/dL   LDL Chol Calc Piccolo Waived 147 (H) <100 mg/dL   VLDL Chol Calc Piccolo,Waive 18 <30 mg/dL      Assessment & Plan:   Problem List Items Addressed This Visit      Unprioritized   Hyperlipidemia    Elevated cholesterol with high ASCVD risk.  Pt refusing statins      Hypertension    Stable, continue present medications.            Follow up plan: Return in about 6 months (around 02/04/2018).

## 2017-08-04 NOTE — Assessment & Plan Note (Signed)
Elevated cholesterol with high ASCVD risk.  Pt refusing statins

## 2017-08-09 DIAGNOSIS — Z1212 Encounter for screening for malignant neoplasm of rectum: Secondary | ICD-10-CM | POA: Diagnosis not present

## 2017-08-09 DIAGNOSIS — Z1211 Encounter for screening for malignant neoplasm of colon: Secondary | ICD-10-CM | POA: Diagnosis not present

## 2017-08-09 LAB — COLOGUARD: COLOGUARD: POSITIVE

## 2017-08-16 ENCOUNTER — Telehealth: Payer: Self-pay | Admitting: Unknown Physician Specialty

## 2017-08-16 MED ORDER — RANITIDINE HCL 300 MG PO CAPS: 300.0000 mg | ORAL_CAPSULE | Freq: Every evening | ORAL | 3 refills | Status: DC

## 2017-08-16 NOTE — Telephone Encounter (Signed)
Called and let patient know that her prescription was sent in for her.

## 2017-08-16 NOTE — Telephone Encounter (Signed)
Routing to provider. Do not see medication in current med list. Can we write a prescription for this?

## 2017-08-17 ENCOUNTER — Telehealth: Payer: Self-pay

## 2017-08-17 DIAGNOSIS — R195 Other fecal abnormalities: Secondary | ICD-10-CM

## 2017-08-17 NOTE — Telephone Encounter (Signed)
Please let the pt know the cologuard test was positive which does not mean colon cancer.  What it does mean is that she will need to get a colonoscopy.  Order entered.

## 2017-08-17 NOTE — Telephone Encounter (Signed)
Received a fax from [employer] with a positive cologuard result for the patient.

## 2017-08-18 NOTE — Telephone Encounter (Signed)
Called and let patient know about positive cologuard and needing a colonoscopy. I explained that gastroenterology should call her about having this done. I asked for her to give me a call back with any questions or concerns.

## 2017-09-20 ENCOUNTER — Telehealth: Payer: Self-pay | Admitting: Unknown Physician Specialty

## 2017-09-20 NOTE — Telephone Encounter (Signed)
Patient received call from office stating there were  Small amounts of blood in her color guard. Patient states she was informed she would be receiving a call in regards to a colonoscopy but has not received one yet and wanted to check on the status.  Please Advise.  Thank you

## 2017-09-20 NOTE — Telephone Encounter (Signed)
Keri, can you check into this please? A referral was entered on 08/17/17.

## 2017-09-20 NOTE — Telephone Encounter (Signed)
Spoke with patient.  Her referral was directed to [facility].  Told patient that the referral was not marked urgent, but she did have a referral and could call and see the status and see about scheduling an appointment.  Explained to patient to call me with any other questions or concerns. Patient understood.

## 2017-09-21 ENCOUNTER — Other Ambulatory Visit: Payer: Self-pay

## 2017-09-21 ENCOUNTER — Telehealth: Payer: Self-pay

## 2017-09-21 DIAGNOSIS — R195 Other fecal abnormalities: Secondary | ICD-10-CM

## 2017-09-21 NOTE — Telephone Encounter (Signed)
Gastroenterology Pre-Procedure Review  Request Date: 10/22 Requesting Physician: Dr. Vicente Males  PATIENT REVIEW QUESTIONS: The patient responded to the following health history questions as indicated:    1. Are you having any GI issues? no 2. Do you have a personal history of Polyps? no 3. Do you have a family history of Colon Cancer or Polyps? no 4. Diabetes Mellitus? no 5. Joint replacements in the past 12 months?no 6. Major health problems in the past 3 months?no 7. Any artificial heart valves, MVP, or defibrillator?no    MEDICATIONS & ALLERGIES:    Patient reports the following regarding taking any anticoagulation/antiplatelet therapy:   Plavix, Coumadin, Eliquis, Xarelto, Lovenox, Pradaxa, Brilinta, or Effient? no Aspirin? yes (81mg )  Patient confirms/reports the following medications:  Current Outpatient Prescriptions  Medication Sig Dispense Refill  . aspirin 81 MG tablet Take 1 tablet (81 mg total) by mouth daily. 30 tablet 12  . hydrochlorothiazide (HYDRODIURIL) 25 MG tablet TAKE 1 TABLET BY MOUTH EVERY DAY 90 tablet 1  . hydrocortisone 2.5 % cream Apply topically 2 (two) times daily. 30 g 2  . metoprolol tartrate (LOPRESSOR) 50 MG tablet TAKE 1 TABLET BY MOUTH EVERY DAY 90 tablet 1  . ranitidine (ZANTAC) 300 MG capsule Take 1 capsule (300 mg total) by mouth every evening. 90 capsule 3  . triamcinolone cream (KENALOG) 0.1 % Apply 1 application topically 2 (two) times daily. (Patient not taking: Reported on 07/26/2017) 30 g 0   No current facility-administered medications for this visit.     Patient confirms/reports the following allergies:  Allergies  Allergen Reactions  . Hydrocodone Nausea Only  . Lipitor [Atorvastatin] Other (See Comments)    "muscle aches"  . Morphine And Related Nausea Only  . Zetia [Ezetimibe] Other (See Comments)    "flu symptoms"    No orders of the defined types were placed in this encounter.   AUTHORIZATION INFORMATION Primary  Insurance: 1D#: Group #:  Secondary Insurance: 1D#: Group #:  SCHEDULE INFORMATION: Date: 10/22 Time: Location: [place]

## 2017-09-26 ENCOUNTER — Telehealth: Payer: Self-pay | Admitting: Gastroenterology

## 2017-09-26 NOTE — Telephone Encounter (Signed)
Patient called and stated she hasn't received her instructions or prep and her procedure is the 22nd. Please call today.

## 2017-09-29 ENCOUNTER — Encounter: Payer: Self-pay | Admitting: *Deleted

## 2017-10-02 ENCOUNTER — Ambulatory Visit: Payer: Medicare Other | Admitting: Anesthesiology

## 2017-10-02 ENCOUNTER — Ambulatory Visit
Admission: RE | Admit: 2017-10-02 | Discharge: 2017-10-02 | Disposition: A | Discharge status: Home / Self Care | Payer: Medicare Other | Source: Ambulatory Visit | Attending: Gastroenterology | Admitting: Gastroenterology

## 2017-10-02 ENCOUNTER — Encounter
Admission: RE | Disposition: A | Discharge status: Home / Self Care | Payer: Self-pay | Source: Ambulatory Visit | Attending: Gastroenterology

## 2017-10-02 DIAGNOSIS — I10 Essential (primary) hypertension: Secondary | ICD-10-CM | POA: Diagnosis not present

## 2017-10-02 DIAGNOSIS — Z9841 Cataract extraction status, right eye: Secondary | ICD-10-CM | POA: Insufficient documentation

## 2017-10-02 DIAGNOSIS — D124 Benign neoplasm of descending colon: Secondary | ICD-10-CM

## 2017-10-02 DIAGNOSIS — Z9889 Other specified postprocedural states: Secondary | ICD-10-CM | POA: Insufficient documentation

## 2017-10-02 DIAGNOSIS — M8580 Other specified disorders of bone density and structure, unspecified site: Secondary | ICD-10-CM | POA: Insufficient documentation

## 2017-10-02 DIAGNOSIS — K5730 Diverticulosis of large intestine without perforation or abscess without bleeding: Secondary | ICD-10-CM | POA: Insufficient documentation

## 2017-10-02 DIAGNOSIS — Z9842 Cataract extraction status, left eye: Secondary | ICD-10-CM | POA: Diagnosis not present

## 2017-10-02 DIAGNOSIS — E785 Hyperlipidemia, unspecified: Secondary | ICD-10-CM | POA: Diagnosis not present

## 2017-10-02 DIAGNOSIS — Z79899 Other long term (current) drug therapy: Secondary | ICD-10-CM | POA: Diagnosis not present

## 2017-10-02 DIAGNOSIS — B000 Eczema herpeticum: Secondary | ICD-10-CM | POA: Insufficient documentation

## 2017-10-02 DIAGNOSIS — K640 First degree hemorrhoids: Secondary | ICD-10-CM

## 2017-10-02 DIAGNOSIS — Z90710 Acquired absence of both cervix and uterus: Secondary | ICD-10-CM | POA: Insufficient documentation

## 2017-10-02 DIAGNOSIS — K648 Other hemorrhoids: Secondary | ICD-10-CM | POA: Diagnosis not present

## 2017-10-02 DIAGNOSIS — Z7982 Long term (current) use of aspirin: Secondary | ICD-10-CM | POA: Insufficient documentation

## 2017-10-02 DIAGNOSIS — Z8249 Family history of ischemic heart disease and other diseases of the circulatory system: Secondary | ICD-10-CM | POA: Diagnosis not present

## 2017-10-02 DIAGNOSIS — D125 Benign neoplasm of sigmoid colon: Secondary | ICD-10-CM | POA: Insufficient documentation

## 2017-10-02 DIAGNOSIS — Z1211 Encounter for screening for malignant neoplasm of colon: Secondary | ICD-10-CM | POA: Diagnosis not present

## 2017-10-02 DIAGNOSIS — Z888 Allergy status to other drugs, medicaments and biological substances status: Secondary | ICD-10-CM | POA: Insufficient documentation

## 2017-10-02 DIAGNOSIS — Z885 Allergy status to narcotic agent status: Secondary | ICD-10-CM | POA: Insufficient documentation

## 2017-10-02 DIAGNOSIS — K635 Polyp of colon: Secondary | ICD-10-CM | POA: Diagnosis not present

## 2017-10-02 DIAGNOSIS — R195 Other fecal abnormalities: Secondary | ICD-10-CM | POA: Diagnosis not present

## 2017-10-02 DIAGNOSIS — K5790 Diverticulosis of intestine, part unspecified, without perforation or abscess without bleeding: Secondary | ICD-10-CM | POA: Diagnosis not present

## 2017-10-02 HISTORY — PX: COLONOSCOPY WITH PROPOFOL: SHX5780

## 2017-10-02 SURGERY — COLONOSCOPY WITH PROPOFOL
Anesthesia: General

## 2017-10-02 MED ORDER — PROPOFOL 500 MG/50ML IV EMUL
INTRAVENOUS | Status: DC | PRN
  Administered 2017-10-02: 150 ug/kg/min via INTRAVENOUS

## 2017-10-02 MED ORDER — LIDOCAINE HCL (PF) 2 % IJ SOLN
INTRAMUSCULAR | Status: AC
  Filled 2017-10-02: qty 10

## 2017-10-02 MED ORDER — PROPOFOL 500 MG/50ML IV EMUL
INTRAVENOUS | Status: AC
  Filled 2017-10-02: qty 50

## 2017-10-02 MED ORDER — PROPOFOL 10 MG/ML IV BOLUS
INTRAVENOUS | Status: AC
Start: 2017-10-02 — End: 2017-10-02
  Filled 2017-10-02: qty 20

## 2017-10-02 MED ORDER — SODIUM CHLORIDE 0.9 % IV SOLN
INTRAVENOUS | Status: DC
  Administered 2017-10-02: 09:00:00 via INTRAVENOUS

## 2017-10-02 MED ORDER — LIDOCAINE HCL (CARDIAC) 20 MG/ML IV SOLN
INTRAVENOUS | Status: DC | PRN
  Administered 2017-10-02: 100 mg via INTRAVENOUS

## 2017-10-02 MED ORDER — PROPOFOL 10 MG/ML IV BOLUS
INTRAVENOUS | Status: DC | PRN
  Administered 2017-10-02: 30 mg via INTRAVENOUS
  Administered 2017-10-02: 50 mg via INTRAVENOUS

## 2017-10-02 NOTE — Anesthesia Procedure Notes (Signed)
Date/Time: 10/02/2017 9:05 AM Performed by: Darlyne Russian Pre-anesthesia Checklist: Patient identified, Emergency Drugs available, Suction available, Patient being monitored and Timeout performed Patient Re-evaluated:Patient Re-evaluated prior to induction Oxygen Delivery Method: Nasal cannula Placement Confirmation: positive ETCO2

## 2017-10-02 NOTE — Anesthesia Postprocedure Evaluation (Signed)
Anesthesia Post Note  Patient: Janet Taylor  Procedure(s) Performed: COLONOSCOPY WITH PROPOFOL (N/A )  Patient location during evaluation: Endoscopy Anesthesia Type: General Level of consciousness: awake and alert and oriented Pain management: pain level controlled Vital Signs Assessment: post-procedure vital signs reviewed and stable Respiratory status: spontaneous breathing, nonlabored ventilation and respiratory function stable Cardiovascular status: blood pressure returned to baseline and stable Postop Assessment: no signs of nausea or vomiting Anesthetic complications: no     Last Vitals:  Vitals:   10/02/17 0950 10/02/17 1000  BP: 115/82 (!) 125/97  Pulse: 92 87  Resp: 15 18  Temp:    SpO2: 100% 100%    Last Pain:  Vitals:   10/02/17 0932  TempSrc: Oral                 Desha Bitner

## 2017-10-02 NOTE — Anesthesia Post-op Follow-up Note (Signed)
Anesthesia QCDR form completed.        

## 2017-10-02 NOTE — Anesthesia Preprocedure Evaluation (Signed)
Anesthesia Evaluation  Patient identified by MRN, date of birth, ID band Patient awake    Reviewed: Allergy & Precautions, NPO status , Patient's Chart, lab work & pertinent test results  History of Anesthesia Complications Negative for: history of anesthetic complications  Airway Mallampati: III  TM Distance: >3 FB Neck ROM: Full    Dental  (+) Implants   Pulmonary neg pulmonary ROS, neg sleep apnea, neg COPD,    breath sounds clear to auscultation- rhonchi (-) wheezing      Cardiovascular Exercise Tolerance: Good hypertension, Pt. on medications (-) CAD, (-) Past MI and (-) Cardiac Stents  Rhythm:Regular Rate:Normal - Systolic murmurs and - Diastolic murmurs    Neuro/Psych negative neurological ROS  negative psych ROS   GI/Hepatic negative GI ROS, Neg liver ROS,   Endo/Other  negative endocrine ROSneg diabetes  Renal/GU negative Renal ROS     Musculoskeletal negative musculoskeletal ROS (+)   Abdominal (+) - obese,   Peds  Hematology negative hematology ROS (+)   Anesthesia Other Findings Past Medical History: No date: Eczema herpeticum No date: Hyperlipidemia No date: Hypertension No date: Menopausal disorder No date: Osteopenia   Reproductive/Obstetrics                             Anesthesia Physical Anesthesia Plan  ASA: II  Anesthesia Plan: General   Post-op Pain Management:    Induction: Intravenous  PONV Risk Score and Plan: 2 and Propofol infusion  Airway Management Planned: Natural Airway  Additional Equipment:   Intra-op Plan:   Post-operative Plan:   Informed Consent: I have reviewed the patients History and Physical, chart, labs and discussed the procedure including the risks, benefits and alternatives for the proposed anesthesia with the patient or authorized representative who has indicated his/her understanding and acceptance.   Dental advisory  given  Plan Discussed with: CRNA and Anesthesiologist  Anesthesia Plan Comments:         Anesthesia Quick Evaluation

## 2017-10-02 NOTE — Transfer of Care (Signed)
Immediate Anesthesia Transfer of Care Note  Patient: Janet Taylor  Procedure(s) Performed: COLONOSCOPY WITH PROPOFOL (N/A )  Patient Location: PACU  Anesthesia Type:General  Level of Consciousness: drowsy and patient cooperative  Airway & Oxygen Therapy: Patient Spontanous Breathing and Patient connected to nasal cannula oxygen  Post-op Assessment: Report given to RN and Post -op Vital signs reviewed and stable  Post vital signs: Reviewed and stable  Last Vitals:  Vitals:   10/02/17 0827 10/02/17 0932  BP: 107/82   Pulse: (!) 110 100  Resp:  20  Temp: 36.7 C   SpO2: 99% 100%    Last Pain:  Vitals:   10/02/17 0932  TempSrc: Oral         Complications: No apparent anesthesia complications

## 2017-10-02 NOTE — Op Note (Signed)
[facility] Gastroenterology Patient Name: Janet Taylor Procedure Date: 10/02/2017 9:04 AM MRN: [id number] Account #: [id number] Date of Birth: 09/01/1946 Admit Type: Outpatient Age: 71 Room: [facility] ENDO ROOM 4 Gender: Female Note Status: Finalized Procedure:            Colonoscopy Indications:          Screening for colorectal malignant neoplasm Providers:            Jonathon Bellows MD, MD Referring MD:         Kathrine Haddock (Referring MD) Medicines:            Monitored Anesthesia Care Complications:        No immediate complications. Procedure:            Pre-Anesthesia Assessment:                       - Prior to the procedure, a History and Physical was                        performed, and patient medications, allergies and                        sensitivities were reviewed. The patient's tolerance of                        previous anesthesia was reviewed.                       - The risks and benefits of the procedure and the                        sedation options and risks were discussed with the                        patient. All questions were answered and informed                        consent was obtained.                       - ASA Grade Assessment: II - A patient with mild                        systemic disease.                       After obtaining informed consent, the colonoscope was                        passed under direct vision. Throughout the procedure,                        the patient's blood pressure, pulse, and oxygen                        saturations were monitored continuously. The Olympus                        CF-H180AL colonoscope ( S#: [id number] ) was introduced  through the anus and advanced to the the cecum,                        identified by the appendiceal orifice, IC valve and                        transillumination. The colonoscopy was performed with                        ease. The patient tolerated  the procedure well. The                        quality of the bowel preparation was good. Findings:      The perianal and digital rectal examinations were normal.      Non-bleeding internal hemorrhoids were found during retroflexion. The       hemorrhoids were large and Grade I (internal hemorrhoids that do not       prolapse).      Two sessile polyps were found in the sigmoid colon. The polyps were 3 to       4 mm in size. These polyps were removed with a cold biopsy forceps.       Resection and retrieval were complete.      A 3 mm polyp was found in the descending colon. The polyp was sessile.       The polyp was removed with a cold biopsy forceps. Resection and       retrieval were complete.      A few small-mouthed diverticula were found in the right colon.      The exam was otherwise without abnormality. Impression:           - Non-bleeding internal hemorrhoids.                       - Two 3 to 4 mm polyps in the sigmoid colon, removed                        with a cold biopsy forceps. Resected and retrieved.                       - One 3 mm polyp in the descending colon, removed with                        a cold biopsy forceps. Resected and retrieved.                       - Diverticulosis in the right colon.                       - The examination was otherwise normal. Recommendation:       - Discharge patient to home (with escort).                       - Resume previous diet.                       - Continue present medications.                       - Await pathology results.                       -  Repeat colonoscopy for surveillance based on                        pathology results. Procedure Code(s):    --- Professional ---                       [phone], Colonoscopy, flexible; with biopsy, single or                        multiple Diagnosis Code(s):    --- Professional ---                       Z12.11, Encounter for screening for malignant neoplasm                         of colon                       D12.5, Benign neoplasm of sigmoid colon                       D12.4, Benign neoplasm of descending colon                       K57.30, Diverticulosis of large intestine without                        perforation or abscess without bleeding                       K64.0, First degree hemorrhoids CPT copyright 2016 American Medical Association. All rights reserved. The codes documented in this report are preliminary and upon coder review may  be revised to meet current compliance requirements. Jonathon Bellows, MD Jonathon Bellows MD, MD 10/02/2017 9:29:11 AM This report has been signed electronically. Number of Addenda: 0 Note Initiated On: 10/02/2017 9:04 AM Scope Withdrawal Time: 0 hours 14 minutes 49 seconds  Total Procedure Duration: 0 hours 17 minutes 44 seconds       [facility]

## 2017-10-02 NOTE — H&P (Addendum)
Jonathon Bellows MD [address], [place], [place] [address] Phone: [phone] Fax : [phone]  Primary Care Physician:  Kathrine Haddock, NP Primary Gastroenterologist:  Dr. Jonathon Bellows   Pre-Procedure History & Physical: HPI:  Janet Taylor is a 71 y.o. female is here for an colonoscopy.   Past Medical History:  Diagnosis Date  . Eczema herpeticum   . Hyperlipidemia   . Hypertension   . Menopausal disorder   . Osteopenia     Past Surgical History:  Procedure Laterality Date  . ABDOMINAL HYSTERECTOMY     complete  . BREAST BIOPSY Right 2015   NEG  . CATARACT EXTRACTION Right 10/03/2016  . cataract surgery Left    09/12/16  . EYE SURGERY Bilateral 09/12/2016   bilateral cataract surgery   . EYE SURGERY  10/03/2016    Prior to Admission medications   Medication Sig Start Date End Date Taking? Authorizing Provider  aspirin 81 MG tablet Take 1 tablet (81 mg total) by mouth daily. 01/13/17  Yes Kathrine Haddock, NP  hydrochlorothiazide (HYDRODIURIL) 25 MG tablet TAKE 1 TABLET BY MOUTH EVERY DAY 03/03/17  Yes Kathrine Haddock, NP  metoprolol tartrate (LOPRESSOR) 50 MG tablet TAKE 1 TABLET BY MOUTH EVERY DAY 07/19/17  Yes Kathrine Haddock, NP  hydrocortisone 2.5 % cream Apply topically 2 (two) times daily. 04/07/17   Kathrine Haddock, NP  ranitidine (ZANTAC) 300 MG capsule Take 1 capsule (300 mg total) by mouth every evening. Patient not taking: Reported on 10/02/2017 08/16/17   Kathrine Haddock, NP  triamcinolone cream (KENALOG) 0.1 % Apply 1 application topically 2 (two) times daily. Patient not taking: Reported on 07/26/2017 04/07/17   Kathrine Haddock, NP    Allergies as of 09/21/2017 - Review Complete 08/04/2017  Allergen Reaction Noted  . Hydrocodone Nausea Only 10/14/2016  . Lipitor [atorvastatin] Other (See Comments) 03/22/2016  . Morphine and related Nausea Only 10/14/2016  . Zetia [ezetimibe] Other (See Comments) 03/22/2016    Family History  Problem Relation Age of Onset    . Hypertension Father   . Hypertension Sister   . Breast cancer Neg Hx     Social History   Social History  . Marital status: Divorced    Spouse name: N/A  . Number of children: N/A  . Years of education: N/A   Occupational History  . Not on file.   Social History Main Topics  . Smoking status: Never Smoker  . Smokeless tobacco: Never Used  . Alcohol use 1.8 oz/week    3 Glasses of wine per week     Comment: on weekends  . Drug use: No  . Sexual activity: No   Other Topics Concern  . Not on file   Social History Narrative  . No narrative on file    Review of Systems: See HPI, otherwise negative ROS  Physical Exam: BP 107/82   Pulse (!) 110   Temp 98.1 F (36.7 C) (Tympanic)   Ht 5\' 4"  (1.626 m)   Wt 160 lb (72.6 kg)   LMP  (LMP Unknown)   SpO2 99%   BMI 27.46 kg/m  General:   Alert,  pleasant and cooperative in NAD Head:  Normocephalic and atraumatic. Neck:  Supple; no masses or thyromegaly. Lungs:  Clear throughout to auscultation.    Heart:  Regular rate and rhythm. Abdomen:  Soft, nontender and nondistended. Normal bowel sounds, without guarding, and without rebound.   Neurologic:  Alert and  oriented x4;  grossly normal neurologically.  Impression/Plan: Janet Taylor is here for an colonoscopy to be performed for colonoscopy due to a positive cologuard  Risks, benefits, limitations, and alternatives regarding  colonoscopy have been reviewed with the patient.  Questions have been answered.  All parties agreeable.   Jonathon Bellows, MD  10/02/2017, 8:39 AM

## 2017-10-03 ENCOUNTER — Encounter: Payer: Self-pay | Admitting: Gastroenterology

## 2017-10-03 LAB — SURGICAL PATHOLOGY

## 2017-10-04 ENCOUNTER — Telehealth: Payer: Self-pay

## 2017-10-04 ENCOUNTER — Ambulatory Visit: Payer: Medicare Other | Admitting: Family Medicine

## 2017-10-04 NOTE — Telephone Encounter (Signed)
If she is getting better, she needs no f/u.  If she is getting worse or not better after a couple of weeks, I will call her in something

## 2017-10-04 NOTE — Telephone Encounter (Signed)
Copied from [facility] #[id number]. Topic: General - Other >> Oct 04, 2017  8:40 AM Vernona Rieger wrote: Reason for CRM:  Patient is requesting to have Kathrine Haddock to give her call today if possible. She will not give me any other information.   Called and spoke to patient. She states that she had a colonoscopy done Monday and while there, the doctor told her that she was coughing and had yellow phlegm. She states that she told them that she has had a cold since Tuesday 09/26/17. Patient states that she does not have a fever, but still has a cough. She states that she only called [place] because the doctor at the hospital told her to check in with [place] regarding this. She states that she started taking Mucinex yesterday and is able to sleep now. Patient states that she knows most providers do not just give out antibiotics, but she wants to know if she needs to ride this out or what she needs to do.

## 2017-10-04 NOTE — Telephone Encounter (Signed)
Called and left patient a VM (signed DPR) letting her know what Malachy Mood said. I asked for her to give [place] a call with any questions or concerns.

## 2017-10-08 ENCOUNTER — Encounter: Payer: Self-pay | Admitting: Gastroenterology

## 2018-01-03 ENCOUNTER — Other Ambulatory Visit: Payer: Self-pay | Admitting: Unknown Physician Specialty

## 2018-01-15 ENCOUNTER — Other Ambulatory Visit: Payer: Self-pay | Admitting: Unknown Physician Specialty

## 2018-02-05 ENCOUNTER — Encounter: Payer: Self-pay | Admitting: Unknown Physician Specialty

## 2018-02-05 ENCOUNTER — Ambulatory Visit (INDEPENDENT_AMBULATORY_CARE_PROVIDER_SITE_OTHER): Payer: Medicare Other | Admitting: Unknown Physician Specialty

## 2018-02-05 VITALS — BP 137/97 | HR 89 | Temp 97.4°F | Ht 64.0 in | Wt 175.7 lb

## 2018-02-05 DIAGNOSIS — I10 Essential (primary) hypertension: Secondary | ICD-10-CM | POA: Diagnosis not present

## 2018-02-05 DIAGNOSIS — E7800 Pure hypercholesterolemia, unspecified: Secondary | ICD-10-CM

## 2018-02-05 NOTE — Assessment & Plan Note (Signed)
Stable, continue present medications.   

## 2018-02-05 NOTE — Assessment & Plan Note (Signed)
Statin intolerant.  Discussed ASCVD risk.  Refer to cardiologist at pt request

## 2018-02-05 NOTE — Progress Notes (Signed)
BP (!) 137/97   Pulse 89   Temp (!) 97.4 F (36.3 C) (Oral)   Ht 5\' 4"  (1.626 m)   Wt 175 lb 11.2 oz (79.7 kg)   LMP  (LMP Unknown)   SpO2 97%   BMI 30.16 kg/m    Subjective:    Patient ID: Janet Taylor, female    DOB: [birth year], 72 y.o.   MRN: [id number]  HPI: Janet Taylor is a 72 y.o. female  Chief Complaint  Patient presents with  . Hyperlipidemia  . Hypertension    pt states she has not taken her BP medication today    Hypertension A little high today as did not take medications Using medications without difficulty Average home BPs 134/87 is typical     No problems or lightheadedness No chest pain with exertion or shortness of breath No Edema  Hyperlipidemia Pt is statin intolerant.  See risk score below.  She is interested in seeing a cardiologist to further discuss needed interventions or risk reduction.  The 10-year ASCVD risk score Mikey Bussing DC Brooke Bonito., et al., 2013) is: 15.3%   Values used to calculate the score:     Age: 20 years     Sex: Female     Is Non-Hispanic African American: Yes     Diabetic: No     Tobacco smoker: No     Systolic Blood Pressure: 176 mmHg     Is BP treated: Yes     HDL Cholesterol: 61 mg/dL     Total Cholesterol: 227 mg/dL   Relevant past medical, surgical, family and social history reviewed and updated as indicated. Interim medical history since our last visit reviewed. Allergies and medications reviewed and updated.  Review of Systems  Per HPI unless specifically indicated above     Objective:    BP (!) 137/97   Pulse 89   Temp (!) 97.4 F (36.3 C) (Oral)   Ht 5\' 4"  (1.626 m)   Wt 175 lb 11.2 oz (79.7 kg)   LMP  (LMP Unknown)   SpO2 97%   BMI 30.16 kg/m   Wt Readings from Last 3 Encounters:  02/05/18 175 lb 11.2 oz (79.7 kg)  10/02/17 160 lb (72.6 kg)  08/04/17 170 lb 3.2 oz (77.2 kg)    Physical Exam  Constitutional: She is oriented to person, place, and time. She appears well-developed and well-nourished. No  distress.  HENT:  Head: Normocephalic and atraumatic.  Eyes: Conjunctivae and lids are normal. Right eye exhibits no discharge. Left eye exhibits no discharge. No scleral icterus.  Neck: Normal range of motion. Neck supple. No JVD present. Carotid bruit is not present.  Cardiovascular: Normal rate, regular rhythm and normal heart sounds.  Pulmonary/Chest: Effort normal and breath sounds normal.  Abdominal: Normal appearance. There is no splenomegaly or hepatomegaly.  Musculoskeletal: Normal range of motion.  Neurological: She is alert and oriented to person, place, and time.  Skin: Skin is warm, dry and intact. No rash noted. No pallor.  Psychiatric: She has a normal mood and affect. Her behavior is normal. Judgment and thought content normal.      Assessment & Plan:   Problem List Items Addressed This Visit      Unprioritized   Hyperlipidemia - Primary    Statin intolerant.  Discussed ASCVD risk.  Refer to cardiologist at pt request      Relevant Orders   Ambulatory referral to Cardiology   Lipid Panel w/o Chol/HDL Ratio  Hypertension    Stable, continue present medications.        Relevant Orders   Ambulatory referral to Cardiology   Comprehensive metabolic panel       Follow up plan: Return in about 6 months (around 08/05/2018) for physical.

## 2018-02-06 ENCOUNTER — Encounter: Payer: Self-pay | Admitting: Unknown Physician Specialty

## 2018-02-06 LAB — COMPREHENSIVE METABOLIC PANEL
ALBUMIN: 4.1 g/dL (ref 3.5–4.8)
ALT: 14 IU/L (ref 0–32)
AST: 16 IU/L (ref 0–40)
Albumin/Globulin Ratio: 1.7 (ref 1.2–2.2)
Alkaline Phosphatase: 65 IU/L (ref 39–117)
BUN/Creatinine Ratio: 14 (ref 12–28)
BUN: 11 mg/dL (ref 8–27)
Bilirubin Total: 0.2 mg/dL (ref 0.0–1.2)
CALCIUM: 9.6 mg/dL (ref 8.7–10.3)
CO2: 24 mmol/L (ref 20–29)
CREATININE: 0.78 mg/dL (ref 0.57–1.00)
Chloride: 103 mmol/L (ref 96–106)
GFR calc Af Amer: 89 mL/min/{1.73_m2} (ref >59)
GFR, EST NON AFRICAN AMERICAN: 77 mL/min/{1.73_m2} (ref >59)
GLUCOSE: 97 mg/dL (ref 65–99)
Globulin, Total: 2.4 g/dL (ref 1.5–4.5)
Potassium: 4.1 mmol/L (ref 3.5–5.2)
SODIUM: 143 mmol/L (ref 134–144)
Total Protein: 6.5 g/dL (ref 6.0–8.5)

## 2018-02-06 LAB — LIPID PANEL W/O CHOL/HDL RATIO
Cholesterol, Total: 218 mg/dL — ABNORMAL HIGH (ref 100–199)
HDL: 55 mg/dL (ref >39)
LDL Calculated: 141 mg/dL — ABNORMAL HIGH (ref 0–99)
Triglycerides: 111 mg/dL (ref 0–149)
VLDL Cholesterol Cal: 22 mg/dL (ref 5–40)

## 2018-02-22 DIAGNOSIS — E782 Mixed hyperlipidemia: Secondary | ICD-10-CM | POA: Diagnosis not present

## 2018-02-22 DIAGNOSIS — R0602 Shortness of breath: Secondary | ICD-10-CM | POA: Insufficient documentation

## 2018-02-22 DIAGNOSIS — I10 Essential (primary) hypertension: Secondary | ICD-10-CM | POA: Insufficient documentation

## 2018-02-22 DIAGNOSIS — R002 Palpitations: Secondary | ICD-10-CM | POA: Diagnosis not present

## 2018-02-22 DIAGNOSIS — R000 Tachycardia, unspecified: Secondary | ICD-10-CM | POA: Diagnosis not present

## 2018-02-22 DIAGNOSIS — Z7689 Persons encountering health services in other specified circumstances: Secondary | ICD-10-CM | POA: Diagnosis not present

## 2018-03-05 DIAGNOSIS — R002 Palpitations: Secondary | ICD-10-CM | POA: Diagnosis not present

## 2018-03-14 DIAGNOSIS — R002 Palpitations: Secondary | ICD-10-CM | POA: Diagnosis not present

## 2018-03-14 DIAGNOSIS — E782 Mixed hyperlipidemia: Secondary | ICD-10-CM | POA: Diagnosis not present

## 2018-03-14 DIAGNOSIS — I10 Essential (primary) hypertension: Secondary | ICD-10-CM | POA: Diagnosis not present

## 2018-03-14 DIAGNOSIS — R0602 Shortness of breath: Secondary | ICD-10-CM | POA: Diagnosis not present

## 2018-05-04 ENCOUNTER — Other Ambulatory Visit: Payer: Self-pay | Admitting: Unknown Physician Specialty

## 2018-06-11 ENCOUNTER — Emergency Department: Payer: Medicare Other

## 2018-06-11 ENCOUNTER — Other Ambulatory Visit: Payer: Self-pay

## 2018-06-11 ENCOUNTER — Emergency Department
Admission: EM | Admit: 2018-06-11 | Discharge: 2018-06-11 | Disposition: A | Discharge status: Home / Self Care | Payer: Medicare Other | Attending: Emergency Medicine | Admitting: Emergency Medicine

## 2018-06-11 DIAGNOSIS — S82432A Displaced oblique fracture of shaft of left fibula, initial encounter for closed fracture: Secondary | ICD-10-CM | POA: Diagnosis not present

## 2018-06-11 DIAGNOSIS — I10 Essential (primary) hypertension: Secondary | ICD-10-CM | POA: Diagnosis not present

## 2018-06-11 DIAGNOSIS — Y939 Activity, unspecified: Secondary | ICD-10-CM | POA: Diagnosis not present

## 2018-06-11 DIAGNOSIS — S89302A Unspecified physeal fracture of lower end of left fibula, initial encounter for closed fracture: Secondary | ICD-10-CM | POA: Diagnosis not present

## 2018-06-11 DIAGNOSIS — Z7982 Long term (current) use of aspirin: Secondary | ICD-10-CM | POA: Diagnosis not present

## 2018-06-11 DIAGNOSIS — S82832A Other fracture of upper and lower end of left fibula, initial encounter for closed fracture: Secondary | ICD-10-CM | POA: Diagnosis not present

## 2018-06-11 DIAGNOSIS — Z79899 Other long term (current) drug therapy: Secondary | ICD-10-CM | POA: Insufficient documentation

## 2018-06-11 DIAGNOSIS — S99912A Unspecified injury of left ankle, initial encounter: Secondary | ICD-10-CM | POA: Diagnosis present

## 2018-06-11 DIAGNOSIS — W108XXA Fall (on) (from) other stairs and steps, initial encounter: Secondary | ICD-10-CM | POA: Insufficient documentation

## 2018-06-11 DIAGNOSIS — Y999 Unspecified external cause status: Secondary | ICD-10-CM | POA: Insufficient documentation

## 2018-06-11 DIAGNOSIS — Y92015 Private garage of single-family (private) house as the place of occurrence of the external cause: Secondary | ICD-10-CM | POA: Insufficient documentation

## 2018-06-11 MED ORDER — IBUPROFEN 600 MG PO TABS: 600.0000 mg | ORAL_TABLET | Freq: Four times a day (QID) | ORAL | 0 refills | Status: DC | PRN

## 2018-06-11 NOTE — ED Notes (Signed)
See triage note  Presents with left ankle injury  States she missed a step last weds and twisted ankle  Positive swelling noted   Good pulses

## 2018-06-11 NOTE — ED Triage Notes (Signed)
Pt c/o left ankle pain - she reports that she was walking down some steps and twisted her left ankle when she missed the last step and fell - denies any further injury

## 2018-06-11 NOTE — ED Provider Notes (Signed)
[facility] Emergency Department Provider Note  ____________________________________________  Time seen: Approximately 1:14 PM  I have reviewed the triage vital signs and the nursing notes.   HISTORY  Chief Complaint Ankle Pain    HPI Janet Taylor is a 72 y.o. female that presents to the emergency department for evaluation of left ankle pain after falling 5 days ago.  Patient  stepped wrong in her garage and rolled her ankle. She has continued to walk on ankle.  She went to church this morning and a nurse told her to come to the emergency department.  She did hit her head but did not lose consciousness.  No numbness, tingling.    Past Medical History:  Diagnosis Date  . Eczema herpeticum   . Hyperlipidemia   . Hypertension   . Menopausal disorder   . Osteopenia     Patient Active Problem List   Diagnosis Date Noted  . Conjunctival hemorrhage of left eye 05/12/2017  . Osteopenia 03/22/2016  . Hypertension 03/22/2016  . Hyperlipidemia 03/22/2016  . Eczema herpeticum 03/22/2016  . Menopausal disorder 03/22/2016    Past Surgical History:  Procedure Laterality Date  . ABDOMINAL HYSTERECTOMY     complete  . BREAST BIOPSY Right 2015   NEG  . CATARACT EXTRACTION Right 10/03/2016  . cataract surgery Left    09/12/16  . COLONOSCOPY WITH PROPOFOL N/A 10/02/2017   Procedure: COLONOSCOPY WITH PROPOFOL;  Surgeon: Jonathon Bellows, MD;  [facility] ENDOSCOPY;  Service: Gastroenterology;  Laterality: N/A;  . EYE SURGERY Bilateral 09/12/2016   bilateral cataract surgery   . EYE SURGERY  10/03/2016    Prior to Admission medications   Medication Sig Start Date End Date Taking? Authorizing Provider  aspirin 81 MG tablet Take 1 tablet (81 mg total) by mouth daily. 01/13/17   Kathrine Haddock, NP  hydrochlorothiazide (HYDRODIURIL) 25 MG tablet TAKE 1 TABLET BY MOUTH EVERY DAY 01/03/18   Kathrine Haddock, NP  hydrocortisone 2.5 % cream APPLY TO AFFECTED AREA TWICE A  DAY 05/08/18   Kathrine Haddock, NP  ibuprofen (ADVIL,MOTRIN) 600 MG tablet Take 1 tablet (600 mg total) by mouth every 6 (six) hours as needed. 06/11/18   Laban Emperor, PA-C  metoprolol tartrate (LOPRESSOR) 50 MG tablet TAKE 1 TABLET BY MOUTH EVERY DAY 01/15/18   Kathrine Haddock, NP    Allergies Hydrocodone; Lipitor [atorvastatin]; Morphine and related; and Zetia [ezetimibe]  Family History  Problem Relation Age of Onset  . Hypertension Father   . Hypertension Sister   . Breast cancer Neg Hx     Social History Social History   Tobacco Use  . Smoking status: Never Smoker  . Smokeless tobacco: Never Used  Substance Use Topics  . Alcohol use: Yes    Alcohol/week: 1.8 oz    Types: 3 Glasses of wine per week    Comment: on weekends  . Drug use: No     Review of Systems  Constitutional: No fever/chills Gastrointestinal: No abdominal pain.  No nausea, no vomiting.  Musculoskeletal: Positive for ankle pain.  Skin: Negative for rash, abrasions, laceration. Positive for ecchymosis. Neurological: Negative for headaches, numbness or tingling   ____________________________________________   PHYSICAL EXAM:  VITAL SIGNS: ED Triage Vitals  Enc Vitals Group     BP 06/11/18 1159 (!) 141/90     Pulse Rate 06/11/18 1159 93     Resp 06/11/18 1159 15     Temp 06/11/18 1159 97.9 F (36.6 C)  Temp Source 06/11/18 1159 Oral     SpO2 06/11/18 1159 98 %     Weight 06/11/18 1156 165 lb (74.8 kg)     Height 06/11/18 1156 5\' 4"  (1.626 m)     Head Circumference --      Peak Flow --      Pain Score 06/11/18 1156 8     Pain Loc --      Pain Edu? --      Excl. in [place]? --      Constitutional: Alert and oriented. Well appearing and in no acute distress. Eyes: Conjunctivae are normal. PERRL. EOMI. Head: Atraumatic. ENT:      Ears:      Nose: No congestion/rhinnorhea.      Mouth/Throat: Mucous membranes are moist.  Neck: No stridor.   Cardiovascular: Normal rate, regular rhythm.  Good  peripheral circulation.  Symmetric dorsalis pedis pulses bilaterally. Respiratory: Normal respiratory effort without tachypnea or retractions. Lungs CTAB. Good air entry to the bases with no decreased or absent breath sounds. Musculoskeletal: Full range of motion to all extremities. No gross deformities appreciated.  Moderate swelling to lateral malleolus.  Full range of motion of toes.  Cap refill less than 3 seconds. Neurologic:  Normal speech and language. No gross focal neurologic deficits are appreciated.  Skin:  Skin is warm, dry and intact. No rash noted. Psychiatric: Mood and affect are normal. Speech and behavior are normal. Patient exhibits appropriate insight and judgement.   ____________________________________________   LABS (all labs ordered are listed, but only abnormal results are displayed)  Labs Reviewed - No data to display ____________________________________________  EKG   ____________________________________________  RADIOLOGY Robinette Haines, personally viewed and evaluated these images (plain radiographs) as part of my medical decision making, as well as reviewing the written report by the radiologist.  Dg Ankle Complete Left  Result Date: 06/11/2018 CLINICAL DATA:  72 year old female status post trip and fall down steps. Pain. EXAM: LEFT ANKLE COMPLETE - 3+ VIEW COMPARISON:  Left ankle series 05/27/2010. FINDINGS: Oblique or spiral fracture of the distal left fibula metadiaphysis tracking to the tibiofibular syndesmosis with minimal lateral displacement and overriding of the distal fragment. Mortise joint alignment preserved. Talar dome intact. Distal tibia intact. Evidence of ankle joint effusion on the lateral view. Calcaneus and other visible bones of the left foot appear intact. Generalized soft tissue swelling. IMPRESSION: 1. Oblique or spiral fracture of the distal left fibula metadiaphysis is minimally displaced. 2. Mortise joint alignment preserved. Probable  joint effusion. No other fracture identified. Electronically Signed   By: Genevie Ann M.D.   On: 06/11/2018 12:43    ____________________________________________    PROCEDURES  Procedure(s) performed:    Procedures    Medications - No data to display   ____________________________________________   INITIAL IMPRESSION / ASSESSMENT AND PLAN / ED COURSE  Pertinent labs & imaging results that were available during my care of the patient were reviewed by me and considered in my medical decision making (see chart for details).  Review of the [place] CSRS was performed in accordance of the [place] prior to dispensing any controlled drugs.     Patient's diagnosis is consistent with fibula fracture.  Vital signs and exam are reassuring.  X-ray consistent with minimally displaced fibula fracture.  Fracture happened 5 days ago.  Ankle splint was placed.  Crutches were given.  Patient is agreeable to call orthopedics today for an appointment.   Patient will be discharged home with prescriptions for  ibuprofen. Patient is to follow up with orthopedics as directed. Patient is given ED precautions to return to the ED for any worsening or new symptoms.     ____________________________________________  FINAL CLINICAL IMPRESSION(S) / ED DIAGNOSES  Final diagnoses:  Closed fracture of distal end of left fibula, unspecified fracture morphology, initial encounter      NEW MEDICATIONS STARTED DURING THIS VISIT:  ED Discharge Orders        Ordered    ibuprofen (ADVIL,MOTRIN) 600 MG tablet  Every 6 hours PRN     06/11/18 1325          This chart was dictated using voice recognition software/Dragon. Despite best efforts to proofread, errors can occur which can change the meaning. Any change was purely unintentional.    Laban Emperor, PA-C 06/11/18 1523    Lavonia Drafts, MD 06/12/18 [phone]

## 2018-06-13 DIAGNOSIS — S82832A Other fracture of upper and lower end of left fibula, initial encounter for closed fracture: Secondary | ICD-10-CM | POA: Diagnosis not present

## 2018-06-13 DIAGNOSIS — M25572 Pain in left ankle and joints of left foot: Secondary | ICD-10-CM | POA: Diagnosis not present

## 2018-07-02 ENCOUNTER — Other Ambulatory Visit: Payer: Self-pay | Admitting: Unknown Physician Specialty

## 2018-07-02 DIAGNOSIS — Z1231 Encounter for screening mammogram for malignant neoplasm of breast: Secondary | ICD-10-CM

## 2018-07-03 ENCOUNTER — Other Ambulatory Visit: Payer: Self-pay | Admitting: Unknown Physician Specialty

## 2018-07-04 DIAGNOSIS — S82832D Other fracture of upper and lower end of left fibula, subsequent encounter for closed fracture with routine healing: Secondary | ICD-10-CM | POA: Diagnosis not present

## 2018-07-10 ENCOUNTER — Encounter: Payer: Self-pay | Admitting: Unknown Physician Specialty

## 2018-07-14 ENCOUNTER — Other Ambulatory Visit: Payer: Self-pay | Admitting: Unknown Physician Specialty

## 2018-07-30 ENCOUNTER — Ambulatory Visit
Admission: RE | Admit: 2018-07-30 | Discharge: 2018-07-30 | Disposition: A | Discharge status: Home / Self Care | Payer: Medicare Other | Source: Ambulatory Visit | Attending: Unknown Physician Specialty | Admitting: Unknown Physician Specialty

## 2018-07-30 DIAGNOSIS — Z1231 Encounter for screening mammogram for malignant neoplasm of breast: Secondary | ICD-10-CM | POA: Insufficient documentation

## 2018-08-01 DIAGNOSIS — E782 Mixed hyperlipidemia: Secondary | ICD-10-CM | POA: Diagnosis not present

## 2018-08-01 DIAGNOSIS — I10 Essential (primary) hypertension: Secondary | ICD-10-CM | POA: Diagnosis not present

## 2018-08-03 ENCOUNTER — Ambulatory Visit (INDEPENDENT_AMBULATORY_CARE_PROVIDER_SITE_OTHER): Payer: Medicare Other

## 2018-08-03 VITALS — BP 118/76 | HR 70 | Temp 97.5°F | Resp 16 | Ht 63.0 in | Wt 171.7 lb

## 2018-08-03 DIAGNOSIS — Z0000 Encounter for general adult medical examination without abnormal findings: Secondary | ICD-10-CM

## 2018-08-03 NOTE — Progress Notes (Signed)
Subjective:   Janet Taylor is a 72 y.o. female who presents for Medicare Annual (Subsequent) preventive examination.  Review of Systems:  Cardiac Risk Factors include: advanced age (>50men, >73 women);dyslipidemia;hypertension     Objective:     Vitals: BP 118/76 (BP Location: Left Arm, Patient Position: Sitting)   Pulse 70   Temp (!) 97.5 F (36.4 C) (Temporal)   Resp 16   Ht 5\' 3"  (1.6 m)   Wt 171 lb 11.2 oz (77.9 kg)   LMP  (LMP Unknown)   BMI 30.42 kg/m   Body mass index is 30.42 kg/m.  Advanced Directives 08/03/2018 06/11/2018 10/02/2017 07/26/2017 05/07/2017 04/13/2016 04/13/2016  Does Patient Have a Medical Advance Directive? Yes No Yes Yes No Yes Yes  Type of Paramedic of [place];Living will - [place];Living will [place];Living will - [place];Living will [place];Living will  Does patient want to make changes to medical advance directive? - - - - - No - Patient declined -  Copy of [place] in Chart? No - copy requested - No - copy requested No - copy requested - Yes -  Would patient like information on creating a medical advance directive? - No - Patient declined - - Yes (ED - Information included in AVS) - -    Tobacco Social History   Tobacco Use  Smoking Status Never Smoker  Smokeless Tobacco Never Used     Counseling given: Not Answered   Clinical Intake:  Pre-visit preparation completed: Yes  Pain : No/denies pain     Nutritional Status: BMI > 30  Obese Nutritional Risks: None Diabetes: No  How often do you need to have someone help you when you read instructions, pamphlets, or other written materials from your doctor or pharmacy?: 1 - Never What is the last grade level you completed in school?: bachelors degree   Interpreter Needed?: No  Information entered by :: Tiffany Hill,LPN   Past Medical History:  Diagnosis Date    . Eczema herpeticum   . Hyperlipidemia   . Hypertension   . Menopausal disorder   . Osteopenia    Past Surgical History:  Procedure Laterality Date  . ABDOMINAL HYSTERECTOMY     complete  . BREAST BIOPSY Right 2015   NEG  . CATARACT EXTRACTION Right 10/03/2016  . cataract surgery Left    09/12/16  . COLONOSCOPY WITH PROPOFOL N/A 10/02/2017   Procedure: COLONOSCOPY WITH PROPOFOL;  Surgeon: Jonathon Bellows, MD;  Location: [facility] ENDOSCOPY;  Service: Gastroenterology;  Laterality: N/A;  . EYE SURGERY Bilateral 09/12/2016   bilateral cataract surgery   . EYE SURGERY  10/03/2016   Family History  Problem Relation Age of Onset  . Hypertension Father   . Hypertension Sister   . Breast cancer Neg Hx    Social History   Socioeconomic History  . Marital status: Divorced    Spouse name: Not on file  . Number of children: Not on file  . Years of education: Not on file  . Highest education level: Bachelor's degree (e.g., BA, AB, BS)  Occupational History  . Not on file  Social Needs  . Financial resource strain: Not hard at all  . Food insecurity:    Worry: Never true    Inability: Never true  . Transportation needs:    Medical: No    Non-medical: No  Tobacco Use  . Smoking status: Never Smoker  .  Smokeless tobacco: Never Used  Substance and Sexual Activity  . Alcohol use: Yes    Alcohol/week: 3.0 standard drinks    Types: 3 Glasses of wine per week    Comment: on weekends  . Drug use: No  . Sexual activity: Never  Lifestyle  . Physical activity:    Days per week: 0 days    Minutes per session: 0 min  . Stress: Not at all  Relationships  . Social connections:    Talks on phone: More than three times a week    Gets together: More than three times a week    Attends religious service: More than 4 times per year    Active member of club or organization: Yes    Attends meetings of clubs or organizations: More than 4 times per year    Relationship status: Divorced  Other  Topics Concern  . Not on file  Social History Narrative  . Not on file    Outpatient Encounter Medications as of 08/03/2018  Medication Sig  . hydrochlorothiazide (HYDRODIURIL) 25 MG tablet TAKE 1 TABLET BY MOUTH EVERY DAY  . hydrocortisone 2.5 % cream APPLY TO AFFECTED AREA TWICE A DAY  . ibuprofen (ADVIL,MOTRIN) 600 MG tablet Take 1 tablet (600 mg total) by mouth every 6 (six) hours as needed.  . metoprolol tartrate (LOPRESSOR) 50 MG tablet TAKE 1 TABLET BY MOUTH EVERY DAY  . aspirin 81 MG tablet Take 1 tablet (81 mg total) by mouth daily. (Patient not taking: Reported on 08/03/2018)   No facility-administered encounter medications on file as of 08/03/2018.     Activities of Daily Living In your present state of health, do you have any difficulty performing the following activities: 08/03/2018  Hearing? N  Vision? Y  Comment going to eye doctor next week   Difficulty concentrating or making decisions? N  Walking or climbing stairs? N  Dressing or bathing? N  Doing errands, shopping? N  Preparing Food and eating ? N  Using the Toilet? N  In the past six months, have you accidently leaked urine? N  Do you have problems with loss of bowel control? N  Managing your Medications? N  Managing your Finances? N  Housekeeping or managing your Housekeeping? N  Some recent data might be hidden    Patient Care Team: Kathrine Haddock, NP as PCP - General (Nurse Practitioner) Idelle Leech, OD (Optometry) Corey Skains, MD as Consulting Physician (Cardiology) Samara Deist, DPM as Referring Physician (Podiatry)    Assessment:   This is a routine wellness examination for Janet Taylor.  Exercise Activities and Dietary recommendations Current Exercise Habits: Home exercise routine, Type of exercise: strength training/weights(lower body strength training ), Time (Minutes): 15, Frequency (Times/Week): 7, Weekly Exercise (Minutes/Week): 105, Intensity: Mild, Exercise limited by: None  identified  Goals    . DIET - INCREASE WATER INTAKE     Recommend drinking at least 6-8 glasses of water a day        Fall Risk Fall Risk  08/03/2018 07/26/2017 05/12/2017 04/13/2016 12/04/2015  Falls in the past year? Yes Yes Yes No No  Number falls in past yr: 1 1 1  - -  Injury with Fall? Yes Yes Yes - -  Risk Factor Category  High Fall Risk - - - -  Follow up Falls prevention discussed Falls prevention discussed - - -   Is the patient's home free of loose throw rugs in walkways, pet beds, electrical cords, etc?   yes  Grab bars in the bathroom? yes      Handrails on the stairs?   yes      Adequate lighting?   yes  Timed Get Up and Go performed: Completed in 8 seconds with no use of assistive devices, steady gait. No intervention needed at this time.   Depression Screen PHQ 2/9 Scores 08/03/2018 07/26/2017 05/12/2017 04/13/2016  PHQ - 2 Score 0 0 0 0     Cognitive Function     6CIT Screen 08/03/2018 07/26/2017  What Year? 0 points 0 points  What month? 0 points 0 points  What time? 0 points 0 points  Count back from 20 0 points 0 points  Months in reverse 0 points 0 points  Repeat phrase 0 points 0 points  Total Score 0 0    Immunization History  Administered Date(s) Administered  . Pneumococcal Conjugate-13 04/02/2014  . Pneumococcal Polysaccharide-23 04/13/2016  . Td 04/14/2004  . Zoster 11/17/2008    Qualifies for Shingles Vaccine? Yes, discussed shingrix vaccine   Screening Tests Health Maintenance  Topic Date Due  . TETANUS/TDAP  04/14/2014  . INFLUENZA VACCINE  10/08/2018 (Originally 07/12/2018)  . MAMMOGRAM  07/30/2020  . COLONOSCOPY  10/02/2022  . DEXA SCAN  Completed  . Hepatitis C Screening  Completed  . PNA vac Low Risk Adult  Completed    Cancer Screenings: Lung: Low Dose CT Chest recommended if Age 70-80 years, 30 pack-year currently smoking OR have quit w/in 15years. Patient does not qualify. Breast:  Up to date on Mammogram? Yes  07/30/2018 Up to  date of Bone Density/Dexa? Yes 02/20/2013 Colorectal: cologuard completed 08/09/2017, colonoscopy completed 10/02/2017  Additional Screenings:  Hepatitis C Screening: completed 04/13/2016     Plan:    I have personally reviewed and addressed the Medicare Annual Wellness questionnaire and have noted the following in the patient's chart:  A. Medical and social history B. Use of alcohol, tobacco or illicit drugs  C. Current medications and supplements D. Functional ability and status E.  Nutritional status F.  Physical activity G. Advance directives H. List of other physicians I.  Hospitalizations, surgeries, and ER visits in previous 12 months J.  [place] such as hearing and vision if needed, cognitive and depression L. Referrals and appointments   In addition, I have reviewed and discussed with patient certain preventive protocols, quality metrics, and best practice recommendations. A written personalized care plan for preventive services as well as general preventive health recommendations were provided to patient.   Signed,  Tyler Aas, LPN Nurse Health Advisor   Nurse Notes:none

## 2018-08-03 NOTE — Patient Instructions (Addendum)
[name] , Thank you for taking time to come for your Medicare Wellness Visit. I appreciate your ongoing commitment to your health goals. Please review the following plan we discussed and let me know if I can assist you in the future.   Screening recommendations/referrals: Colonoscopy: cologuard completed 08/09/2017, colonoscopy completed 10/02/2017, due in 5 years from screening.  Mammogram: completed 07/30/2018 Bone Density: completed 02/20/2013 Recommended yearly ophthalmology/optometry visit for glaucoma screening and checkup Recommended yearly dental visit for hygiene and checkup  Vaccinations: Influenza vaccine: due 08/2018- declined  Pneumococcal vaccine: completed series  Tdap vaccine: due, check with your insurance company for coverage  Shingles vaccine: shingrix eligible, check with your insurance company for coverage    Advanced directives: Please bring a copy of your health care power of attorney and living will to the office at your convenience.  Conditions/risks identified: Recommend drinking at least 6-8 glasses of water a day   Next appointment: Follow up on on 08/14/2018 at 10:30pm with Carles Collet, PA. Follow up in one year for your annual wellness exam.    Preventive Care 65 Years and Older, Female Preventive care refers to lifestyle choices and visits with your health care provider that can promote health and wellness. What does preventive care include?  A yearly physical exam. This is also called an annual well check.  Dental exams once or twice a year.  Routine eye exams. Ask your health care provider how often you should have your eyes checked.  Personal lifestyle choices, including:  Daily care of your teeth and gums.  Regular physical activity.  Eating a healthy diet.  Avoiding tobacco and drug use.  Limiting alcohol use.  Practicing safe sex.  Taking low-dose aspirin every day.  Taking vitamin and mineral supplements as recommended by your  health care provider. What happens during an annual well check? The services and screenings done by your health care provider during your annual well check will depend on your age, overall health, lifestyle risk factors, and family history of disease. Counseling  Your health care provider may ask you questions about your:  Alcohol use.  Tobacco use.  Drug use.  Emotional well-being.  Home and relationship well-being.  Sexual activity.  Eating habits.  History of falls.  Memory and ability to understand (cognition).  Work and work Statistician.  Reproductive health. Screening  You may have the following tests or measurements:  Height, weight, and BMI.  Blood pressure.  Lipid and cholesterol levels. These may be checked every 5 years, or more frequently if you are over 20 years old.  Skin check.  Lung cancer screening. You may have this screening every year starting at age 1 if you have a 30-pack-year history of smoking and currently smoke or have quit within the past 15 years.  Fecal occult blood test (FOBT) of the stool. You may have this test every year starting at age 60.  Flexible sigmoidoscopy or colonoscopy. You may have a sigmoidoscopy every 5 years or a colonoscopy every 10 years starting at age 66.  Hepatitis C blood test.  Hepatitis B blood test.  Sexually transmitted disease (STD) testing.  Diabetes screening. This is done by checking your blood sugar (glucose) after you have not eaten for a while (fasting). You may have this done every 1-3 years.  Bone density scan. This is done to screen for osteoporosis. You may have this done starting at age 97.  Mammogram. This may be done every 1-2 years. Talk to your health  care provider about how often you should have regular mammograms. Talk with your health care provider about your test results, treatment options, and if necessary, the need for more tests. Vaccines  Your health care provider may recommend  certain vaccines, such as:  Influenza vaccine. This is recommended every year.  Tetanus, diphtheria, and acellular pertussis (Tdap, Td) vaccine. You may need a Td booster every 10 years.  Zoster vaccine. You may need this after age 58.  Pneumococcal 13-valent conjugate (PCV13) vaccine. One dose is recommended after age 39.  Pneumococcal polysaccharide (PPSV23) vaccine. One dose is recommended after age 51. Talk to your health care provider about which screenings and vaccines you need and how often you need them. This information is not intended to replace advice given to you by your health care provider. Make sure you discuss any questions you have with your health care provider. Document Released: 12/25/2015 Document Revised: 08/17/2016 Document Reviewed: 09/29/2015 Elsevier Interactive Patient Education  2017 [place] Prevention in the Home Falls can cause injuries. They can happen to people of all ages. There are many things you can do to make your home safe and to help prevent falls. What can I do on the outside of my home?  Regularly fix the edges of walkways and driveways and fix any cracks.  Remove anything that might make you trip as you walk through a door, such as a raised step or threshold.  Trim any bushes or trees on the path to your home.  Use bright outdoor lighting.  Clear any walking paths of anything that might make someone trip, such as rocks or tools.  Regularly check to see if handrails are loose or broken. Make sure that both sides of any steps have handrails.  Any raised decks and porches should have guardrails on the edges.  Have any leaves, snow, or ice cleared regularly.  Use sand or salt on walking paths during winter.  Clean up any spills in your garage right away. This includes oil or grease spills. What can I do in the bathroom?  Use night lights.  Install grab bars by the toilet and in the tub and shower. Do not use towel bars as  grab bars.  Use non-skid mats or decals in the tub or shower.  If you need to sit down in the shower, use a plastic, non-slip stool.  Keep the floor dry. Clean up any water that spills on the floor as soon as it happens.  Remove soap buildup in the tub or shower regularly.  Attach bath mats securely with double-sided non-slip rug tape.  Do not have throw rugs and other things on the floor that can make you trip. What can I do in the bedroom?  Use night lights.  Make sure that you have a light by your bed that is easy to reach.  Do not use any sheets or blankets that are too big for your bed. They should not hang down onto the floor.  Have a firm chair that has side arms. You can use this for support while you get dressed.  Do not have throw rugs and other things on the floor that can make you trip. What can I do in the kitchen?  Clean up any spills right away.  Avoid walking on wet floors.  Keep items that you use a lot in easy-to-reach places.  If you need to reach something above you, use a strong step stool that has a grab  bar.  Keep electrical cords out of the way.  Do not use floor polish or wax that makes floors slippery. If you must use wax, use non-skid floor wax.  Do not have throw rugs and other things on the floor that can make you trip. What can I do with my stairs?  Do not leave any items on the stairs.  Make sure that there are handrails on both sides of the stairs and use them. Fix handrails that are broken or loose. Make sure that handrails are as long as the stairways.  Check any carpeting to make sure that it is firmly attached to the stairs. Fix any carpet that is loose or worn.  Avoid having throw rugs at the top or bottom of the stairs. If you do have throw rugs, attach them to the floor with carpet tape.  Make sure that you have a light switch at the top of the stairs and the bottom of the stairs. If you do not have them, ask someone to add them  for you. What else can I do to help prevent falls?  Wear shoes that:  Do not have high heels.  Have rubber bottoms.  Are comfortable and fit you well.  Are closed at the toe. Do not wear sandals.  If you use a stepladder:  Make sure that it is fully opened. Do not climb a closed stepladder.  Make sure that both sides of the stepladder are locked into place.  Ask someone to hold it for you, if possible.  Clearly mark and make sure that you can see:  Any grab bars or handrails.  First and last steps.  Where the edge of each step is.  Use tools that help you move around (mobility aids) if they are needed. These include:  Canes.  Walkers.  Scooters.  Crutches.  Turn on the lights when you go into a dark area. Replace any light bulbs as soon as they burn out.  Set up your furniture so you have a clear path. Avoid moving your furniture around.  If any of your floors are uneven, fix them.  If there are any pets around you, be aware of where they are.  Review your medicines with your doctor. Some medicines can make you feel dizzy. This can increase your chance of falling. Ask your doctor what other things that you can do to help prevent falls. This information is not intended to replace advice given to you by your health care provider. Make sure you discuss any questions you have with your health care provider. Document Released: 09/24/2009 Document Revised: 05/05/2016 Document Reviewed: 01/02/2015 Elsevier Interactive Patient Education  2017 [employer].

## 2018-08-06 ENCOUNTER — Ambulatory Visit: Payer: Medicare Other | Admitting: Unknown Physician Specialty

## 2018-08-06 DIAGNOSIS — S82832D Other fracture of upper and lower end of left fibula, subsequent encounter for closed fracture with routine healing: Secondary | ICD-10-CM | POA: Diagnosis not present

## 2018-08-10 ENCOUNTER — Ambulatory Visit: Payer: Medicare Other | Admitting: Unknown Physician Specialty

## 2018-08-14 ENCOUNTER — Ambulatory Visit (INDEPENDENT_AMBULATORY_CARE_PROVIDER_SITE_OTHER): Payer: Medicare Other | Admitting: Physician Assistant

## 2018-08-14 ENCOUNTER — Encounter: Payer: Self-pay | Admitting: Physician Assistant

## 2018-08-14 VITALS — BP 121/81 | HR 79 | Temp 98.7°F | Wt 169.6 lb

## 2018-08-14 DIAGNOSIS — B000 Eczema herpeticum: Secondary | ICD-10-CM | POA: Diagnosis not present

## 2018-08-14 DIAGNOSIS — E7800 Pure hypercholesterolemia, unspecified: Secondary | ICD-10-CM

## 2018-08-14 DIAGNOSIS — I10 Essential (primary) hypertension: Secondary | ICD-10-CM

## 2018-08-14 MED ORDER — HYDROCORTISONE 2.5 % EX CREA: TOPICAL_CREAM | Freq: Two times a day (BID) | CUTANEOUS | 0 refills | Status: DC

## 2018-08-14 NOTE — Progress Notes (Addendum)
Subjective:    Patient ID: Janet Taylor, female    DOB: [birth year], 72 y.o.   MRN: [id number]  Janet Taylor is a 72 y.o. female presenting on 08/14/2018 for Hyperlipidemia and Hypertension   HPI   HTN: Taking metoprolol 50 mg daily and HCTZ 25 mg daily. No chest pain or SOB.   HLD: Statin intolerant. Previously tried on atorvasttain and zetia which caused intense pain.   Lipid Panel     Component Value Date/Time   CHOL 270 (H) 08/14/2018 1030   CHOL 227 (H) 01/13/2017 1039   TRIG 122 08/14/2018 1030   TRIG 92 01/13/2017 1039   HDL 61 08/14/2018 1030   CHOLHDL 4.4 08/14/2018 1030   VLDL 18 01/13/2017 1039   LDLCALC 185 (H) 08/14/2018 1030   Flu shot: declines   Eczema: Needs refill on hydrocortisone cream that she uses for eczema.   Social History   Tobacco Use  . Smoking status: Never Smoker  . Smokeless tobacco: Never Used  Substance Use Topics  . Alcohol use: Yes    Alcohol/week: 3.0 standard drinks    Types: 3 Glasses of wine per week    Comment: on weekends  . Drug use: No    Review of Systems Per HPI unless specifically indicated above     Objective:    BP 121/81   Pulse 79   Temp 98.7 F (37.1 C) (Oral)   Wt 169 lb 9.6 oz (76.9 kg)   LMP  (LMP Unknown)   SpO2 98%   BMI 30.04 kg/m   Wt Readings from Last 3 Encounters:  08/14/18 169 lb 9.6 oz (76.9 kg)  08/03/18 171 lb 11.2 oz (77.9 kg)  06/11/18 165 lb (74.8 kg)    Physical Exam  Constitutional: She is oriented to person, place, and time. She appears well-developed and well-nourished.  Cardiovascular: Normal rate and regular rhythm.  Pulmonary/Chest: Effort normal and breath sounds normal.  Neurological: She is alert and oriented to person, place, and time.   Results for orders placed or performed in visit on 08/14/18  Comp Met (CMET)  Result Value Ref Range   Glucose 115 (H) 65 - 99 mg/dL   BUN 10 8 - 27 mg/dL   Creatinine, Ser 0.85 0.57 - 1.00 mg/dL   GFR calc non Af Amer 69 >59  mL/min/1.73   GFR calc Af Amer 80 >59 mL/min/1.73   BUN/Creatinine Ratio 12 12 - 28   Sodium 140 134 - 144 mmol/L   Potassium 4.0 3.5 - 5.2 mmol/L   Chloride 99 96 - 106 mmol/L   CO2 27 20 - 29 mmol/L   Calcium 10.0 8.7 - 10.3 mg/dL   Total Protein 7.0 6.0 - 8.5 g/dL   Albumin 4.4 3.5 - 4.8 g/dL   Globulin, Total 2.6 1.5 - 4.5 g/dL   Albumin/Globulin Ratio 1.7 1.2 - 2.2   Bilirubin Total 0.3 0.0 - 1.2 mg/dL   Alkaline Phosphatase 74 39 - 117 IU/L   AST 17 0 - 40 IU/L   ALT 21 0 - 32 IU/L  Lipid Profile  Result Value Ref Range   Cholesterol, Total 270 (H) 100 - 199 mg/dL   Triglycerides 122 0 - 149 mg/dL   HDL 61 >39 mg/dL   VLDL Cholesterol Cal 24 5 - 40 mg/dL   LDL Calculated 185 (H) 0 - 99 mg/dL   Chol/HDL Ratio 4.4 0.0 - 4.4 ratio      Assessment & Plan:  1.  Essential hypertension  Bp is stable, continue current medications.  - Comp Met (CMET)  2. Pure hypercholesterolemia  Elevated, statin intolerant to high intensity. Will send simvastatin 5 mg daily to take with COQ10 100 mg daily. Follow up in 6 weeks.   - Lipid Profile  3. Eczema herpeticum  Follow up plan: Return in about 6 months (around 02/12/2019) for CPE/f/u in 6 mo, she has atena.  Carles Collet, PA-C [facility] 08/15/2018, 3:27 PM

## 2018-08-14 NOTE — Patient Instructions (Signed)

## 2018-08-15 LAB — COMPREHENSIVE METABOLIC PANEL
ALT: 21 IU/L (ref 0–32)
AST: 17 IU/L (ref 0–40)
Albumin/Globulin Ratio: 1.7 (ref 1.2–2.2)
Albumin: 4.4 g/dL (ref 3.5–4.8)
Alkaline Phosphatase: 74 IU/L (ref 39–117)
BUN/Creatinine Ratio: 12 (ref 12–28)
BUN: 10 mg/dL (ref 8–27)
Bilirubin Total: 0.3 mg/dL (ref 0.0–1.2)
CO2: 27 mmol/L (ref 20–29)
Calcium: 10 mg/dL (ref 8.7–10.3)
Chloride: 99 mmol/L (ref 96–106)
Creatinine, Ser: 0.85 mg/dL (ref 0.57–1.00)
GFR calc Af Amer: 80 mL/min/{1.73_m2} (ref >59)
GFR calc non Af Amer: 69 mL/min/{1.73_m2} (ref >59)
Globulin, Total: 2.6 g/dL (ref 1.5–4.5)
Glucose: 115 mg/dL — ABNORMAL HIGH (ref 65–99)
Potassium: 4 mmol/L (ref 3.5–5.2)
Sodium: 140 mmol/L (ref 134–144)
Total Protein: 7 g/dL (ref 6.0–8.5)

## 2018-08-15 LAB — LIPID PANEL
Chol/HDL Ratio: 4.4 ratio (ref 0.0–4.4)
Cholesterol, Total: 270 mg/dL — ABNORMAL HIGH (ref 100–199)
HDL: 61 mg/dL (ref >39)
LDL Calculated: 185 mg/dL — ABNORMAL HIGH (ref 0–99)
Triglycerides: 122 mg/dL (ref 0–149)
VLDL Cholesterol Cal: 24 mg/dL (ref 5–40)

## 2018-08-16 ENCOUNTER — Other Ambulatory Visit: Payer: Self-pay | Admitting: Physician Assistant

## 2018-08-16 DIAGNOSIS — E7800 Pure hypercholesterolemia, unspecified: Secondary | ICD-10-CM

## 2018-08-16 MED ORDER — SIMVASTATIN 5 MG PO TABS: 5.0000 mg | ORAL_TABLET | Freq: Every day | ORAL | 0 refills | Status: DC

## 2018-08-27 DIAGNOSIS — H52223 Regular astigmatism, bilateral: Secondary | ICD-10-CM | POA: Diagnosis not present

## 2018-08-27 DIAGNOSIS — H5203 Hypermetropia, bilateral: Secondary | ICD-10-CM | POA: Diagnosis not present

## 2018-08-27 DIAGNOSIS — H40023 Open angle with borderline findings, high risk, bilateral: Secondary | ICD-10-CM | POA: Diagnosis not present

## 2018-08-27 DIAGNOSIS — H2513 Age-related nuclear cataract, bilateral: Secondary | ICD-10-CM | POA: Diagnosis not present

## 2018-08-27 DIAGNOSIS — H524 Presbyopia: Secondary | ICD-10-CM | POA: Diagnosis not present

## 2018-09-17 ENCOUNTER — Ambulatory Visit (INDEPENDENT_AMBULATORY_CARE_PROVIDER_SITE_OTHER): Payer: Medicare Other

## 2018-09-17 DIAGNOSIS — Z23 Encounter for immunization: Secondary | ICD-10-CM | POA: Diagnosis not present

## 2018-11-07 ENCOUNTER — Other Ambulatory Visit: Payer: Self-pay | Admitting: Physician Assistant

## 2018-11-07 DIAGNOSIS — E7800 Pure hypercholesterolemia, unspecified: Secondary | ICD-10-CM

## 2018-11-12 ENCOUNTER — Other Ambulatory Visit: Payer: Self-pay

## 2018-11-12 DIAGNOSIS — E7800 Pure hypercholesterolemia, unspecified: Secondary | ICD-10-CM

## 2018-12-17 ENCOUNTER — Other Ambulatory Visit: Payer: Self-pay | Admitting: Unknown Physician Specialty

## 2018-12-18 ENCOUNTER — Other Ambulatory Visit: Payer: Self-pay | Admitting: *Deleted

## 2018-12-18 MED ORDER — HYDROCHLOROTHIAZIDE 25 MG PO TABS: 25.0000 mg | ORAL_TABLET | Freq: Every day | ORAL | 0 refills | Status: DC

## 2018-12-18 NOTE — Progress Notes (Signed)
Requested Prescriptions  Pending Prescriptions Disp Refills  . hydrochlorothiazide (HYDRODIURIL) 25 MG tablet 90 tablet 0    Sig: Take 1 tablet (25 mg total) by mouth daily.     There is no refill protocol information for this order

## 2018-12-24 ENCOUNTER — Other Ambulatory Visit: Payer: Self-pay | Admitting: Unknown Physician Specialty

## 2019-01-11 NOTE — Telephone Encounter (Signed)
closing encounter

## 2019-01-20 ENCOUNTER — Other Ambulatory Visit: Payer: Self-pay | Admitting: Physician Assistant

## 2019-02-11 ENCOUNTER — Other Ambulatory Visit: Payer: Self-pay | Admitting: Nurse Practitioner

## 2019-02-11 DIAGNOSIS — E7800 Pure hypercholesterolemia, unspecified: Secondary | ICD-10-CM

## 2019-02-11 NOTE — Telephone Encounter (Signed)
Requested medication (s) are due for refill today: yes  Requested medication (s) are on the active medication list: yes  Last refill:  11/12/18 for 90 tabs  Future visit scheduled: yes 02/12/2019  Notes to clinic:  antilipid - statins failed  Requested Prescriptions  Pending Prescriptions Disp Refills   simvastatin (ZOCOR) 5 MG tablet [Pharmacy Med Name: SIMVASTATIN 5 MG TABLET] 90 tablet 0    Sig: TAKE 1 TABLET BY MOUTH EVERY DAY     Cardiovascular:  Antilipid - Statins Failed - 02/11/2019  1:50 AM      Failed - Total Cholesterol in normal range and within 360 days    Cholesterol, Total  Date Value Ref Range Status  08/14/2018 270 (H) 100 - 199 mg/dL Final   Cholesterol Piccolo, Waived  Date Value Ref Range Status  01/13/2017 227 (H) <200 mg/dL Final    Comment:                            Desirable                <200                         Borderline High      200- 239                         High                     >239          Failed - LDL in normal range and within 360 days    LDL Calculated  Date Value Ref Range Status  08/14/2018 185 (H) 0 - 99 mg/dL Final         Passed - HDL in normal range and within 360 days    HDL  Date Value Ref Range Status  08/14/2018 61 >39 mg/dL Final         Passed - Triglycerides in normal range and within 360 days    Triglycerides  Date Value Ref Range Status  08/14/2018 122 0 - 149 mg/dL Final   Triglycerides Piccolo,Waived  Date Value Ref Range Status  01/13/2017 92 <150 mg/dL Final    Comment:                            Normal                   <150                         Borderline High     150 - 199                         High                200 - 499                         Very High                >499          Passed - Patient is not pregnant      Passed - Valid encounter within last 12 months  Recent Outpatient Visits          6 months ago Essential hypertension   [facility] Trinna Post, PA-C   1 year ago Pure hypercholesterolemia   [facility] Kathrine Haddock, NP   1 year ago Essential hypertension   [place] Kathrine Haddock, NP   1 year ago Conjunctival hemorrhage of left eye   [facility] Kathrine Haddock, NP   2 years ago Pure hypercholesterolemia   [place] Kathrine Haddock, NP      Future Appointments            Tomorrow Ned Card, Barbaraann Faster, NP [place]   In 5 months  [facility], [place]

## 2019-02-12 ENCOUNTER — Ambulatory Visit (INDEPENDENT_AMBULATORY_CARE_PROVIDER_SITE_OTHER): Payer: Medicare Other | Admitting: Nurse Practitioner

## 2019-02-12 ENCOUNTER — Encounter: Payer: Self-pay | Admitting: Nurse Practitioner

## 2019-02-12 VITALS — BP 120/82 | HR 73 | Temp 98.8°F | Ht 62.6 in | Wt 169.0 lb

## 2019-02-12 DIAGNOSIS — Z780 Asymptomatic menopausal state: Secondary | ICD-10-CM | POA: Diagnosis not present

## 2019-02-12 DIAGNOSIS — I10 Essential (primary) hypertension: Secondary | ICD-10-CM

## 2019-02-12 DIAGNOSIS — E7800 Pure hypercholesterolemia, unspecified: Secondary | ICD-10-CM

## 2019-02-12 DIAGNOSIS — Z0000 Encounter for general adult medical examination without abnormal findings: Secondary | ICD-10-CM | POA: Diagnosis not present

## 2019-02-12 NOTE — Assessment & Plan Note (Signed)
Chronic, ongoing.  Continue current medication regimen.  Lipid panel today.  Consider Crestor 3 days a week if side effects with Simvastatin.

## 2019-02-12 NOTE — Assessment & Plan Note (Signed)
Chronic, ongoing.  Continue current medication regimen.  CMP and CBC today.

## 2019-02-12 NOTE — Patient Instructions (Signed)
Healthy Eating Following a healthy eating pattern may help you to achieve and maintain a healthy body weight, reduce the risk of chronic disease, and live a long and productive life. It is important to follow a healthy eating pattern at an appropriate calorie level for your body. Your nutritional needs should be met primarily through food by choosing a variety of nutrient-rich foods. What are tips for following this plan? Reading food labels  Read labels and choose the following: ? Reduced or low sodium. ? Juices with 100% fruit juice. ? Foods with low saturated fats and high polyunsaturated and monounsaturated fats. ? Foods with whole grains, such as whole wheat, cracked wheat, brown rice, and wild rice. ? Whole grains that are fortified with folic acid. This is recommended for women who are pregnant or who want to become pregnant.  Read labels and avoid the following: ? Foods with a lot of added sugars. These include foods that contain brown sugar, corn sweetener, corn syrup, dextrose, fructose, glucose, high-fructose corn syrup, honey, invert sugar, lactose, malt syrup, maltose, molasses, raw sugar, sucrose, trehalose, or turbinado sugar.  Do not eat more than the following amounts of added sugar per day:  6 teaspoons (25 g) for women.  9 teaspoons (38 g) for men. ? Foods that contain processed or refined starches and grains. ? Refined grain products, such as white flour, degermed cornmeal, white bread, and white rice. Shopping  Choose nutrient-rich snacks, such as vegetables, whole fruits, and nuts. Avoid high-calorie and high-sugar snacks, such as potato chips, fruit snacks, and candy.  Use oil-based dressings and spreads on foods instead of solid fats such as butter, stick margarine, or cream cheese.  Limit pre-made sauces, mixes, and "instant" products such as flavored rice, instant noodles, and ready-made pasta.  Try more plant-protein sources, such as tofu, tempeh, black beans,  edamame, lentils, nuts, and seeds.  Explore eating plans such as the Mediterranean diet or vegetarian diet. Cooking  Use oil to saut or stir-fry foods instead of solid fats such as butter, stick margarine, or lard.  Try baking, boiling, grilling, or broiling instead of frying.  Remove the fatty part of meats before cooking.  Steam vegetables in water or broth. Meal planning   At meals, imagine dividing your plate into fourths: ? One-half of your plate is fruits and vegetables. ? One-fourth of your plate is whole grains. ? One-fourth of your plate is protein, especially lean meats, poultry, eggs, tofu, beans, or nuts.  Include low-fat dairy as part of your daily diet. Lifestyle  Choose healthy options in all settings, including home, work, school, restaurants, or stores.  Prepare your food safely: ? Wash your hands after handling raw meats. ? Keep food preparation surfaces clean by regularly washing with hot, soapy water. ? Keep raw meats separate from ready-to-eat foods, such as fruits and vegetables. ? Cook seafood, meat, poultry, and eggs to the recommended internal temperature. ? Store foods at safe temperatures. In general:  Keep cold foods at 59F (4.4C) or below.  Keep hot foods at 159F (60C) or above.  Keep your freezer at [facility] (-17.8C) or below.  Foods are no longer safe to eat when they have been between the temperatures of 40-159F (4.4-60C) for more than 2 hours. What foods should I eat? Fruits Aim to eat 2 cup-equivalents of fresh, canned (in natural juice), or frozen fruits each day. Examples of 1 cup-equivalent of fruit include 1 small apple, 8 large strawberries, 1 cup canned fruit,  cup  dried fruit, or 1 cup 100% juice. Vegetables Aim to eat 2-3 cup-equivalents of fresh and frozen vegetables each day, including different varieties and colors. Examples of 1 cup-equivalent of vegetables include 2 medium carrots, 2 cups raw, leafy greens, 1 cup chopped  vegetable (raw or cooked), or 1 medium baked potato. Grains Aim to eat 6 ounce-equivalents of whole grains each day. Examples of 1 ounce-equivalent of grains include 1 slice of bread, 1 cup ready-to-eat cereal, 3 cups popcorn, or  cup cooked rice, pasta, or cereal. Meats and other proteins Aim to eat 5-6 ounce-equivalents of protein each day. Examples of 1 ounce-equivalent of protein include 1 egg, 1/2 cup nuts or seeds, or 1 tablespoon (16 g) peanut butter. A cut of meat or fish that is the size of a deck of cards is about 3-4 ounce-equivalents.  Of the protein you eat each week, try to have at least 8 ounces come from seafood. This includes salmon, trout, herring, and anchovies. Dairy Aim to eat 3 cup-equivalents of fat-free or low-fat dairy each day. Examples of 1 cup-equivalent of dairy include 1 cup (240 mL) milk, 8 ounces (250 g) yogurt, 1 ounces (44 g) natural cheese, or 1 cup (240 mL) fortified soy milk. Fats and oils  Aim for about 5 teaspoons (21 g) per day. Choose monounsaturated fats, such as canola and olive oils, avocados, peanut butter, and most nuts, or polyunsaturated fats, such as sunflower, corn, and soybean oils, walnuts, pine nuts, sesame seeds, sunflower seeds, and flaxseed. Beverages  Aim for six 8-oz glasses of water per day. Limit coffee to three to five 8-oz cups per day.  Limit caffeinated beverages that have added calories, such as soda and energy drinks.  Limit alcohol intake to no more than 1 drink a day for nonpregnant women and 2 drinks a day for men. One drink equals 12 oz of beer (355 mL), 5 oz of wine (148 mL), or 1 oz of hard liquor (44 mL). Seasoning and other foods  Avoid adding excess amounts of salt to your foods. Try flavoring foods with herbs and spices instead of salt.  Avoid adding sugar to foods.  Try using oil-based dressings, sauces, and spreads instead of solid fats. This information is based on general [place] nutrition guidelines. For more  information, visit [employer]. Exact amounts may vary based on your nutrition needs. Summary  A healthy eating plan may help you to maintain a healthy weight, reduce the risk of chronic diseases, and stay active throughout your life.  Plan your meals. Make sure you eat the right portions of a variety of nutrient-rich foods.  Try baking, boiling, grilling, or broiling instead of frying.  Choose healthy options in all settings, including home, work, school, restaurants, or stores. This information is not intended to replace advice given to you by your health care provider. Make sure you discuss any questions you have with your health care provider. Document Released: 03/12/2018 Document Revised: 03/12/2018 Document Reviewed: 03/12/2018 Elsevier Interactive Patient Education  2019 [employer].

## 2019-02-12 NOTE — Progress Notes (Signed)
BP 120/82   Pulse 73   Temp 98.8 F (37.1 C) (Temporal)   Ht 5' 2.6" (1.59 m)   Wt 169 lb (76.7 kg)   LMP  (LMP Unknown)   SpO2 97%   BMI 30.32 kg/m    Subjective:    Patient ID: Janet Taylor, female    DOB: 01-15-1946, 73 y.o.   MRN: [id number]  HPI: Janet Taylor is a 73 y.o. female presenting on 02/12/2019 for comprehensive medical examination. Current medical complaints include:none  She currently lives with: lives on own Menopausal Symptoms: no   HYPERTENSION / [place] Satisfied with current treatment? yes Duration of hypertension: chronic BP monitoring frequency: not checking BP range:  BP medication side effects: no Duration of hyperlipidemia: chronic Cholesterol medication side effects: occasional muscle pains and has been taking medication every other day Cholesterol supplements: none Medication compliance: good compliance Aspirin: no Recent stressors: no Recurrent headaches: no Visual changes: no Palpitations: no Dyspnea: no Chest pain: no Lower extremity edema: no Dizzy/lightheaded: yes, occasional when she bends down to get something   Depression Screen done today and results listed below:  Depression screen [facility] 2/9 02/12/2019 08/03/2018 07/26/2017 05/12/2017 04/13/2016  Decreased Interest 0 0 0 0 0  Down, Depressed, Hopeless 0 0 0 0 0  PHQ - 2 Score 0 0 0 0 0  Altered sleeping 1 - - - -  Tired, decreased energy 1 - - - -  Change in appetite 0 - - - -  Feeling bad or failure about yourself  0 - - - -  Trouble concentrating 0 - - - -  Moving slowly or fidgety/restless 0 - - - -  Suicidal thoughts 0 - - - -  PHQ-9 Score 2 - - - -  Difficult doing work/chores Not difficult at all - - - -    The patient has a history of falls. I did complete a risk assessment for falls. A plan of care for falls was documented.   Past Medical History:  Past Medical History:  Diagnosis Date  . Eczema herpeticum   . Hyperlipidemia   . Hypertension   . Menopausal  disorder   . Osteopenia     Surgical History:  Past Surgical History:  Procedure Laterality Date  . ABDOMINAL HYSTERECTOMY     complete  . BREAST BIOPSY Right 2015   NEG  . CATARACT EXTRACTION Right 10/03/2016  . cataract surgery Left    09/12/16  . COLONOSCOPY WITH PROPOFOL N/A 10/02/2017   Procedure: COLONOSCOPY WITH PROPOFOL;  Surgeon: Jonathon Bellows, MD;  Location: [facility] ENDOSCOPY;  Service: Gastroenterology;  Laterality: N/A;  . EYE SURGERY Bilateral 09/12/2016   bilateral cataract surgery   . EYE SURGERY  10/03/2016    Medications:  Current Outpatient Medications on File Prior to Visit  Medication Sig  . hydrochlorothiazide (HYDRODIURIL) 25 MG tablet Take 1 tablet (25 mg total) by mouth daily.  . hydrocortisone 2.5 % cream Apply topically 2 (two) times daily.  Marland Kitchen ibuprofen (ADVIL,MOTRIN) 600 MG tablet Take 1 tablet (600 mg total) by mouth every 6 (six) hours as needed.  . metoprolol tartrate (LOPRESSOR) 50 MG tablet TAKE 1 TABLET BY MOUTH EVERY DAY  . simvastatin (ZOCOR) 5 MG tablet TAKE 1 TABLET BY MOUTH EVERY DAY   No current facility-administered medications on file prior to visit.     Allergies:  Allergies  Allergen Reactions  . Hydrocodone Nausea Only  . Lipitor [Atorvastatin] Other (See Comments)    "  muscle aches"  . Morphine And Related Nausea Only  . Zetia [Ezetimibe] Other (See Comments)    "flu symptoms"    Social History:  Social History   Socioeconomic History  . Marital status: Divorced    Spouse name: Not on file  . Number of children: Not on file  . Years of education: Not on file  . Highest education level: Bachelor's degree (e.g., BA, AB, BS)  Occupational History  . Not on file  Social Needs  . Financial resource strain: Not hard at all  . Food insecurity:    Worry: Never true    Inability: Never true  . Transportation needs:    Medical: No    Non-medical: No  Tobacco Use  . Smoking status: Never Smoker  . Smokeless tobacco: Never  Used  Substance and Sexual Activity  . Alcohol use: Yes    Alcohol/week: 3.0 standard drinks    Types: 3 Glasses of wine per week    Comment: on weekends  . Drug use: No  . Sexual activity: Never  Lifestyle  . Physical activity:    Days per week: 0 days    Minutes per session: 0 min  . Stress: Not at all  Relationships  . Social connections:    Talks on phone: More than three times a week    Gets together: More than three times a week    Attends religious service: More than 4 times per year    Active member of club or organization: Yes    Attends meetings of clubs or organizations: More than 4 times per year    Relationship status: Divorced  . Intimate partner violence:    Fear of current or ex partner: No    Emotionally abused: No    Physically abused: No    Forced sexual activity: No  Other Topics Concern  . Not on file  Social History Narrative  . Not on file   Social History   Tobacco Use  Smoking Status Never Smoker  Smokeless Tobacco Never Used   Social History   Substance and Sexual Activity  Alcohol Use Yes  . Alcohol/week: 3.0 standard drinks  . Types: 3 Glasses of wine per week   Comment: on weekends    Family History:  Family History  Problem Relation Age of Onset  . Hypertension Father   . Hypertension Sister   . Breast cancer Neg Hx     Past medical history, surgical history, medications, allergies, family history and social history reviewed with patient today and changes made to appropriate areas of the chart.   Review of Systems - Negative All other ROS negative except what is listed above and in the HPI.      Objective:    BP 120/82   Pulse 73   Temp 98.8 F (37.1 C) (Temporal)   Ht 5' 2.6" (1.59 m)   Wt 169 lb (76.7 kg)   LMP  (LMP Unknown)   SpO2 97%   BMI 30.32 kg/m   Wt Readings from Last 3 Encounters:  02/12/19 169 lb (76.7 kg)  08/14/18 169 lb 9.6 oz (76.9 kg)  08/03/18 171 lb 11.2 oz (77.9 kg)    Physical Exam Vitals  signs and nursing note reviewed.  Constitutional:      General: She is awake.     Appearance: She is well-developed.  HENT:     Head: Normocephalic and atraumatic.     Right Ear: Hearing, tympanic membrane, ear canal and  external ear normal.     Left Ear: Hearing, tympanic membrane, ear canal and external ear normal.     Nose: Nose normal.     Right Sinus: No maxillary sinus tenderness or frontal sinus tenderness.     Left Sinus: No maxillary sinus tenderness or frontal sinus tenderness.     Mouth/Throat:     Mouth: Mucous membranes are moist.     Pharynx: Oropharynx is clear. Uvula midline.  Eyes:     General:        Right eye: No discharge.        Left eye: No discharge.     Conjunctiva/sclera: Conjunctivae normal.     Pupils: Pupils are equal, round, and reactive to light.  Neck:     Musculoskeletal: Normal range of motion and neck supple.     Thyroid: No thyromegaly.     Vascular: No carotid bruit or JVD.  Cardiovascular:     Rate and Rhythm: Normal rate and regular rhythm.     Heart sounds: Normal heart sounds.  Pulmonary:     Effort: Pulmonary effort is normal.     Breath sounds: Normal breath sounds.  Chest:     Breasts:        Right: Normal. No swelling, bleeding, inverted nipple, mass or nipple discharge.        Left: Normal. No swelling, bleeding, inverted nipple, mass or nipple discharge.  Abdominal:     General: Bowel sounds are normal.     Palpations: Abdomen is soft. There is no hepatomegaly or splenomegaly.  Musculoskeletal: Normal range of motion.  Lymphadenopathy:     Cervical: No cervical adenopathy.     Upper Body:     Right upper body: No supraclavicular, axillary or pectoral adenopathy.     Left upper body: No supraclavicular, axillary or pectoral adenopathy.  Skin:    General: Skin is warm and dry.  Neurological:     Mental Status: She is alert and oriented to person, place, and time.     Cranial Nerves: Cranial nerves are intact.     Motor: Motor  function is intact.     Coordination: Coordination is intact.     Gait: Gait is intact.     Deep Tendon Reflexes: Reflexes are normal and symmetric.     Reflex Scores:      Brachioradialis reflexes are 2+ on the right side and 2+ on the left side.      Patellar reflexes are 2+ on the right side and 2+ on the left side. Psychiatric:        Attention and Perception: Attention normal.        Mood and Affect: Mood normal.        Speech: Speech normal.        Behavior: Behavior normal. Behavior is cooperative.        Thought Content: Thought content normal.        Cognition and Memory: Cognition normal.        Judgment: Judgment normal.     Results for orders placed or performed in visit on 08/14/18  Comp Met (CMET)  Result Value Ref Range   Glucose 115 (H) 65 - 99 mg/dL   BUN 10 8 - 27 mg/dL   Creatinine, Ser 0.85 0.57 - 1.00 mg/dL   GFR calc non Af Amer 69 >59 mL/min/1.73   GFR calc Af Amer 80 >59 mL/min/1.73   BUN/Creatinine Ratio 12 12 - 28   Sodium 140 134 -  144 mmol/L   Potassium 4.0 3.5 - 5.2 mmol/L   Chloride 99 96 - 106 mmol/L   CO2 27 20 - 29 mmol/L   Calcium 10.0 8.7 - 10.3 mg/dL   Total Protein 7.0 6.0 - 8.5 g/dL   Albumin 4.4 3.5 - 4.8 g/dL   Globulin, Total 2.6 1.5 - 4.5 g/dL   Albumin/Globulin Ratio 1.7 1.2 - 2.2   Bilirubin Total 0.3 0.0 - 1.2 mg/dL   Alkaline Phosphatase 74 39 - 117 IU/L   AST 17 0 - 40 IU/L   ALT 21 0 - 32 IU/L  Lipid Profile  Result Value Ref Range   Cholesterol, Total 270 (H) 100 - 199 mg/dL   Triglycerides 122 0 - 149 mg/dL   HDL 61 >39 mg/dL   VLDL Cholesterol Cal 24 5 - 40 mg/dL   LDL Calculated 185 (H) 0 - 99 mg/dL   Chol/HDL Ratio 4.4 0.0 - 4.4 ratio      Assessment & Plan:   Problem List Items Addressed This Visit      Cardiovascular and Mediastinum   Hypertension    Chronic, ongoing.  Continue current medication regimen.  CMP and CBC today.      Relevant Orders   CBC with Differential/Platelet   Comprehensive  metabolic panel     Other   Hyperlipidemia    Chronic, ongoing.  Continue current medication regimen.  Lipid panel today.  Consider Crestor 3 days a week if side effects with Simvastatin.      Relevant Orders   Lipid Panel w/o Chol/HDL Ratio    Other Visit Diagnoses    Encounter for annual physical exam    -  Primary   Relevant Orders   TSH   Postmenopausal estrogen deficiency       Relevant Orders   DG Bone Density       Follow up plan: Return in about 6 months (around 08/15/2019) for HTN/HLD.   LABORATORY TESTING:  - Pap smear: history of hysterectomy, no further paps  IMMUNIZATIONS:   - Tdap: Tetanus vaccination status reviewed: last tetanus booster within 10 years. - Influenza: Up to date - Pneumovax: Up to date - Prevnar: Up to date - HPV: Not applicable - Zostavax vaccine: Up to date  SCREENING: -Mammogram: Up to date  - Colonoscopy: Up to date  - Bone Density: Ordered today  -Hearing Test: Not applicable  -Spirometry: Not applicable   PATIENT COUNSELING:   Advised to take 1 mg of folate supplement per day if capable of pregnancy.   Sexuality: Discussed sexually transmitted diseases, partner selection, use of condoms, avoidance of unintended pregnancy  and contraceptive alternatives.   Advised to avoid cigarette smoking.  I discussed with the patient that most people either abstain from alcohol or drink within safe limits (<=14/week and <=4 drinks/occasion for males, <=7/weeks and <= 3 drinks/occasion for females) and that the risk for alcohol disorders and other health effects rises proportionally with the number of drinks per week and how often a drinker exceeds daily limits.  Discussed cessation/primary prevention of drug use and availability of treatment for abuse.   Diet: Encouraged to adjust caloric intake to maintain  or achieve ideal body weight, to reduce intake of dietary saturated fat and total fat, to limit sodium intake by avoiding high sodium  foods and not adding table salt, and to maintain adequate dietary potassium and calcium preferably from fresh fruits, vegetables, and low-fat dairy products.    stressed the importance of  regular exercise  Injury prevention: Discussed safety belts, safety helmets, smoke detector, smoking near bedding or upholstery.   Dental health: Discussed importance of regular tooth brushing, flossing, and dental visits.    NEXT PREVENTATIVE PHYSICAL DUE IN 1 YEAR. Return in about 6 months (around 08/15/2019) for HTN/HLD.

## 2019-02-13 ENCOUNTER — Telehealth: Payer: Self-pay | Admitting: Nurse Practitioner

## 2019-02-13 ENCOUNTER — Other Ambulatory Visit: Payer: Self-pay | Admitting: Nurse Practitioner

## 2019-02-13 LAB — COMPREHENSIVE METABOLIC PANEL
A/G RATIO: 1.8 (ref 1.2–2.2)
ALBUMIN: 4.5 g/dL (ref 3.7–4.7)
ALT: 28 IU/L (ref 0–32)
AST: 18 IU/L (ref 0–40)
Alkaline Phosphatase: 74 IU/L (ref 39–117)
BUN/Creatinine Ratio: 14 (ref 12–28)
BUN: 10 mg/dL (ref 8–27)
Bilirubin Total: 0.3 mg/dL (ref 0.0–1.2)
CO2: 24 mmol/L (ref 20–29)
CREATININE: 0.72 mg/dL (ref 0.57–1.00)
Calcium: 10 mg/dL (ref 8.7–10.3)
Chloride: 98 mmol/L (ref 96–106)
GFR, EST AFRICAN AMERICAN: 97 mL/min/{1.73_m2} (ref >59)
GFR, EST NON AFRICAN AMERICAN: 85 mL/min/{1.73_m2} (ref >59)
GLOBULIN, TOTAL: 2.5 g/dL (ref 1.5–4.5)
Glucose: 90 mg/dL (ref 65–99)
POTASSIUM: 4.1 mmol/L (ref 3.5–5.2)
SODIUM: 138 mmol/L (ref 134–144)
TOTAL PROTEIN: 7 g/dL (ref 6.0–8.5)

## 2019-02-13 LAB — CBC WITH DIFFERENTIAL/PLATELET
BASOS: 1 %
Basophils Absolute: 0 10*3/uL (ref 0.0–0.2)
EOS (ABSOLUTE): 0.3 10*3/uL (ref 0.0–0.4)
EOS: 5 %
HEMOGLOBIN: 14.8 g/dL (ref 11.1–15.9)
Hematocrit: 46.1 % (ref 34.0–46.6)
IMMATURE GRANS (ABS): 0 10*3/uL (ref 0.0–0.1)
IMMATURE GRANULOCYTES: 0 %
Lymphocytes Absolute: 2.3 10*3/uL (ref 0.7–3.1)
Lymphs: 37 %
MCH: 29.1 pg (ref 26.6–33.0)
MCHC: 32.1 g/dL (ref 31.5–35.7)
MCV: 91 fL (ref 79–97)
MONOCYTES: 6 %
MONOS ABS: 0.4 10*3/uL (ref 0.1–0.9)
NEUTROS PCT: 51 %
Neutrophils Absolute: 3.3 10*3/uL (ref 1.4–7.0)
Platelets: 350 10*3/uL (ref 150–450)
RBC: 5.08 x10E6/uL (ref 3.77–5.28)
RDW: 13.4 % (ref 11.7–15.4)
WBC: 6.3 10*3/uL (ref 3.4–10.8)

## 2019-02-13 LAB — TSH: TSH: 1.1 u[IU]/mL (ref 0.450–4.500)

## 2019-02-13 LAB — LIPID PANEL W/O CHOL/HDL RATIO
Cholesterol, Total: 224 mg/dL — ABNORMAL HIGH (ref 100–199)
HDL: 58 mg/dL (ref >39)
LDL CALC: 144 mg/dL — AB (ref 0–99)
Triglycerides: 110 mg/dL (ref 0–149)
VLDL Cholesterol Cal: 22 mg/dL (ref 5–40)

## 2019-02-13 MED ORDER — ROSUVASTATIN CALCIUM 5 MG PO TABS: ORAL_TABLET | ORAL | 3 refills | Status: DC

## 2019-02-13 NOTE — Telephone Encounter (Signed)
Sent script in

## 2019-02-13 NOTE — Telephone Encounter (Signed)
Copied from [place] [id number]. Topic: Quick Communication - See Telephone Encounter >> Feb 13, 2019  3:49 PM Vernona Rieger wrote: CRM for notification. See Telephone encounter for: 02/13/19.  Patient states she is fine with starting Rosuvastatin instead of continuing the simvastatin (ZOCOR) 5 MG tablet, please advise.

## 2019-02-13 NOTE — Progress Notes (Signed)
Patient wishing to change from Simvastatin to Rosuvastatin after discussion with provider with a three day a week schedule.  Script sent.

## 2019-03-31 ENCOUNTER — Other Ambulatory Visit: Payer: Self-pay | Admitting: Unknown Physician Specialty

## 2019-04-19 ENCOUNTER — Other Ambulatory Visit: Payer: Self-pay | Admitting: Physician Assistant

## 2019-04-19 DIAGNOSIS — B000 Eczema herpeticum: Secondary | ICD-10-CM

## 2019-04-22 MED ORDER — HYDROCORTISONE 2.5 % EX CREA: TOPICAL_CREAM | Freq: Two times a day (BID) | CUTANEOUS | 0 refills | Status: DC

## 2019-04-22 NOTE — Telephone Encounter (Signed)
Routing to provider  

## 2019-05-27 ENCOUNTER — Telehealth: Payer: Self-pay | Admitting: Nurse Practitioner

## 2019-05-27 NOTE — Chronic Care Management (AMB) (Signed)
Chronic Care Management   Note  05/27/2019 Name: LYNDEN CARRITHERS MRN: [id number] DOB: 1945/12/19  [name] is a 73 y.o. year old female who is a primary care patient of Cannady, Barbaraann Faster, NP. I reached out to [employer] by phone today in response to a referral sent by Ms. Shaunice B Donati's health plan.    Ms. Mclaine was given information about Chronic Care Management services today including:  1. CCM service includes personalized support from designated clinical staff supervised by her physician, including individualized plan of care and coordination with other care providers 2. 24/7 contact phone numbers for assistance for urgent and routine care needs. 3. Service will only be billed when office clinical staff spend 20 minutes or more in a month to coordinate care. 4. Only one practitioner may furnish and bill the service in a calendar month. 5. The patient may stop CCM services at any time (effective at the end of the month) by phone call to the office staff. 6. The patient will be responsible for cost sharing (co-pay) of up to 20% of the service fee (after annual deductible is met).  Patient agreed to services and verbal consent obtained.   Follow up plan: Telephone appointment with CCM team member scheduled for: 06/12/2019  Keene  ??[email or web address]   ??[phone]

## 2019-06-03 ENCOUNTER — Other Ambulatory Visit: Payer: Self-pay

## 2019-06-03 ENCOUNTER — Ambulatory Visit
Admission: RE | Admit: 2019-06-03 | Discharge: 2019-06-03 | Disposition: A | Discharge status: Home / Self Care | Payer: Medicare Other | Source: Ambulatory Visit | Attending: Nurse Practitioner | Admitting: Nurse Practitioner

## 2019-06-03 DIAGNOSIS — M85851 Other specified disorders of bone density and structure, right thigh: Secondary | ICD-10-CM | POA: Diagnosis not present

## 2019-06-03 DIAGNOSIS — Z780 Asymptomatic menopausal state: Secondary | ICD-10-CM | POA: Diagnosis not present

## 2019-06-12 ENCOUNTER — Telehealth: Payer: Medicare Other

## 2019-06-12 ENCOUNTER — Ambulatory Visit: Payer: Self-pay | Admitting: *Deleted

## 2019-06-12 DIAGNOSIS — I10 Essential (primary) hypertension: Secondary | ICD-10-CM

## 2019-06-12 NOTE — Chronic Care Management (AMB) (Signed)
  Chronic Care Management   Note  06/12/2019 Name: Janet Taylor MRN: [id number] DOB: 01-29-1946  Placed a call to [name] per scheduled appointment to introduce her to CCM program. Patient requested this phone appointment be rescheduled related to this was not a good time to talk. Appointment rescheduled with patient for July 22 @ 2pm.   Follow up plan: The care management team will reach out to the patient again over the next 21 days.   Merlene Morse Lauren Modisette RN, BSN Nurse Case Editor, [facility]ing [facility]  [phone]) Business Mobile

## 2019-07-03 ENCOUNTER — Telehealth: Payer: Self-pay

## 2019-07-04 ENCOUNTER — Other Ambulatory Visit: Payer: Self-pay | Admitting: Nurse Practitioner

## 2019-07-04 DIAGNOSIS — Z1231 Encounter for screening mammogram for malignant neoplasm of breast: Secondary | ICD-10-CM

## 2019-07-16 ENCOUNTER — Other Ambulatory Visit: Payer: Self-pay | Admitting: Physician Assistant

## 2019-07-17 ENCOUNTER — Telehealth: Payer: Self-pay

## 2019-08-06 DIAGNOSIS — E782 Mixed hyperlipidemia: Secondary | ICD-10-CM | POA: Diagnosis not present

## 2019-08-06 DIAGNOSIS — I10 Essential (primary) hypertension: Secondary | ICD-10-CM | POA: Diagnosis not present

## 2019-08-06 DIAGNOSIS — R002 Palpitations: Secondary | ICD-10-CM | POA: Diagnosis not present

## 2019-08-07 ENCOUNTER — Telehealth: Payer: Self-pay

## 2019-08-08 ENCOUNTER — Ambulatory Visit (INDEPENDENT_AMBULATORY_CARE_PROVIDER_SITE_OTHER): Payer: Medicare Other

## 2019-08-08 VITALS — BP 132/89 | HR 75 | Temp 96.1°F | Ht 63.5 in | Wt 169.6 lb

## 2019-08-08 DIAGNOSIS — Z0000 Encounter for general adult medical examination without abnormal findings: Secondary | ICD-10-CM | POA: Diagnosis not present

## 2019-08-08 NOTE — Progress Notes (Signed)
Subjective:   Janet Taylor is a 73 y.o. female who presents for Medicare Annual (Subsequent) preventive examination.  This visit is being conducted via phone call  - after an attmept to do on video chat - due to the COVID-19 pandemic. This patient has given me verbal consent via phone to conduct this visit, patient states they are participating from their home address. Some vital signs may be absent or patient reported.   Patient identification: identified by name, DOB, and current address.    Review of Systems:   Cardiac Risk Factors include: advanced age (>3men, >9 women);hypertension;dyslipidemia     Objective:     Vitals: BP 132/89 Comment: pt reprted  Pulse 75 Comment: pt reported  Temp (!) 96.1 F (35.6 C) (Oral) Comment: pt reported  Ht 5' 3.5" (1.613 m) Comment: pt reported  Wt 169 lb 9.6 oz (76.9 kg) Comment: pt reported  LMP  (LMP Unknown)   BMI 29.57 kg/m   Body mass index is 29.57 kg/m.  Advanced Directives 08/08/2019 08/03/2018 06/11/2018 10/02/2017 07/26/2017 05/07/2017 04/13/2016  Does Patient Have a Medical Advance Directive? No Yes No Yes Yes No Yes  Type of Advance Directive - [place];Living will - [place];Living will [place];Living will - [place];Living will  Does patient want to make changes to medical advance directive? - - - - - - No - Patient declined  Copy of [place] in Chart? - No - copy requested - No - copy requested No - copy requested - Yes  Would patient like information on creating a medical advance directive? - - No - Patient declined - - Yes (ED - Information included in AVS) -    Tobacco Social History   Tobacco Use  Smoking Status Never Smoker  Smokeless Tobacco Never Used     Counseling given: Not Answered   Clinical Intake:  Pre-visit preparation completed: Yes  Pain : No/denies pain     Nutritional Risks: None Diabetes: No   How often do you need to have someone help you when you read instructions, pamphlets, or other written materials from your doctor or pharmacy?: 1 - Never  Interpreter Needed?: No  Information entered by :: Riyana Biel,LPN  Past Medical History:  Diagnosis Date  . Eczema herpeticum   . Hyperlipidemia   . Hypertension   . Menopausal disorder   . Osteopenia    Past Surgical History:  Procedure Laterality Date  . ABDOMINAL HYSTERECTOMY     complete  . BREAST BIOPSY Right 2015   NEG  . CATARACT EXTRACTION Right 10/03/2016  . cataract surgery Left    09/12/16  . COLONOSCOPY WITH PROPOFOL N/A 10/02/2017   Procedure: COLONOSCOPY WITH PROPOFOL;  Surgeon: Jonathon Bellows, MD;  Location: [facility] ENDOSCOPY;  Service: Gastroenterology;  Laterality: N/A;  . EYE SURGERY Bilateral 09/12/2016   bilateral cataract surgery   . EYE SURGERY  10/03/2016   Family History  Problem Relation Age of Onset  . Hypertension Father   . Hypertension Sister   . Breast cancer Neg Hx    Social History   Socioeconomic History  . Marital status: Divorced    Spouse name: Not on file  . Number of children: Not on file  . Years of education: Not on file  . Highest education level: Bachelor's degree (e.g., BA, AB, BS)  Occupational History  . Occupation: retired  Scientific laboratory technician  . Financial resource strain: Not hard  at all  . Food insecurity    Worry: Never true    Inability: Never true  . Transportation needs    Medical: No    Non-medical: No  Tobacco Use  . Smoking status: Never Smoker  . Smokeless tobacco: Never Used  Substance and Sexual Activity  . Alcohol use: Yes    Alcohol/week: 3.0 standard drinks    Types: 3 Glasses of wine per week    Comment: on weekends  . Drug use: No  . Sexual activity: Never  Lifestyle  . Physical activity    Days per week: 3 days    Minutes per session: 30 min  . Stress: Not at all  Relationships  . Social connections    Talks on phone: More than three times a  week    Gets together: More than three times a week    Attends religious service: More than 4 times per year    Active member of club or organization: Yes    Attends meetings of clubs or organizations: More than 4 times per year    Relationship status: Divorced  Other Topics Concern  . Not on file  Social History Narrative   Walks outside for 30 minutes 3 days a week, works outside in yard    Outpatient Encounter Medications as of 08/08/2019  Medication Sig  . hydrochlorothiazide (HYDRODIURIL) 25 MG tablet TAKE 1 TABLET BY MOUTH EVERY DAY  . ibuprofen (ADVIL,MOTRIN) 600 MG tablet Take 1 tablet (600 mg total) by mouth every 6 (six) hours as needed.  . metoprolol tartrate (LOPRESSOR) 50 MG tablet TAKE 1 TABLET BY MOUTH EVERY DAY  . rosuvastatin (CRESTOR) 5 MG tablet Take one tablet (5 MG) at HS on Monday, Wednesday, and Friday only.  . hydrocortisone 2.5 % cream Apply topically 2 (two) times daily. (Patient not taking: Reported on 08/08/2019)   No facility-administered encounter medications on file as of 08/08/2019.     Activities of Daily Living In your present state of health, do you have any difficulty performing the following activities: 08/08/2019 02/12/2019  Hearing? N N  Comment no hearing aids -  Vision? N N  Comment eyeglasses, sees Dr.Nice annually -  Difficulty concentrating or making decisions? N N  Walking or climbing stairs? N N  Dressing or bathing? N N  Doing errands, shopping? N N  Preparing Food and eating ? N -  Using the Toilet? N -  In the past six months, have you accidently leaked urine? N -  Do you have problems with loss of bowel control? N -  Managing your Medications? N -  Managing your Finances? N -  Housekeeping or managing your Housekeeping? N -  Some recent data might be hidden    Patient Care Team: Venita Lick, NP as PCP - General (Nurse Practitioner) Idelle Leech, OD (Optometry) Corey Skains, MD as Consulting Physician (Cardiology)  Samara Deist, DPM as Referring Physician (Podiatry) Minor, Dalbert Garnet, RN as [facility] Management    Assessment:   This is a routine wellness examination for Janet Taylor.  Exercise Activities and Dietary recommendations Current Exercise Habits: Home exercise routine, Type of exercise: walking, Time (Minutes): 30, Frequency (Times/Week): 3, Weekly Exercise (Minutes/Week): 90, Intensity: Mild, Exercise limited by: None identified  Goals    . DIET - INCREASE WATER INTAKE     Recommend drinking at least 6-8 glasses of water a day        Fall Risk: Fall Risk  08/08/2019 02/12/2019 08/03/2018 07/26/2017 05/12/2017  Falls in the past year? 0 1 Yes Yes Yes  Number falls in past yr: - 0 1 1 1   Injury with Fall? - 1 Yes Yes Yes  Risk Factor Category  - - High Fall Risk - -  Risk for fall due to : - History of fall(s) - - -  Follow up - Falls evaluation completed Falls prevention discussed Falls prevention discussed -    FALL RISK PREVENTION PERTAINING TO THE HOME:  Any stairs in or around the home? Yes  If so, are there any without handrails? No   Home free of loose throw rugs in walkways, pet beds, electrical cords, etc? Yes  Adequate lighting in your home to reduce risk of falls? Yes   ASSISTIVE DEVICES UTILIZED TO PREVENT FALLS:  Life alert? No  Use of a cane, walker or w/c? No  Grab bars in the bathroom? No  Shower chair or bench in shower? No  Elevated toilet seat or a handicapped toilet? No   DME ORDERS:  DME order needed?  No   TIMED UP AND GO:  Unable to perform    Depression Screen PHQ 2/9 Scores 08/08/2019 02/12/2019 08/03/2018 07/26/2017  PHQ - 2 Score 0 0 0 0  PHQ- 9 Score - 2 - -     Cognitive Function     6CIT Screen 08/03/2018 07/26/2017  What Year? 0 points 0 points  What month? 0 points 0 points  What time? 0 points 0 points  Count back from 20 0 points 0 points  Months in reverse 0 points 0 points  Repeat phrase 0 points 0 points  Total  Score 0 0    Immunization History  Administered Date(s) Administered  . Influenza, High Dose Seasonal PF 09/17/2018  . Pneumococcal Conjugate-13 04/02/2014  . Pneumococcal Polysaccharide-23 04/13/2016  . Td 04/14/2004  . Zoster 11/17/2008    Qualifies for Shingles Vaccine? Yes  Zostavax completed n/a. Due for Shingrix. Education has been provided regarding the importance of this vaccine. Pt has been advised to call insurance company to determine out of pocket expense. Advised may also receive vaccine at local pharmacy or Health Dept. Verbalized acceptance and understanding.  Tdap: Discussed need for TD/TDAP vaccine, patient verbalized understanding that this is not covered as a preventative with there insurance and to call the office if she develops any new skin injuries, ie: cuts, scrapes, bug bites, or open wounds.   Flu Vaccine: Due now   Pneumococcal Vaccine: completed series   Screening Tests Health Maintenance  Topic Date Due  . INFLUENZA VACCINE  07/13/2019  . TETANUS/TDAP  08/15/2019 (Originally 04/14/2014)  . MAMMOGRAM  07/30/2020  . COLONOSCOPY  10/02/2022  . DEXA SCAN  Completed  . Hepatitis C Screening  Completed  . PNA vac Low Risk Adult  Completed    Cancer Screenings:  Colorectal Screening: Completed 10/02/2017, completed 5 years ago   Mammogram: scheduled 08/14/2019  Bone Density: Completed 06/03/2019.   Lung Cancer Screening: (Low Dose CT Chest recommended if Age 73-80 years, 30 pack-year currently smoking OR have quit w/in 15years.) does not qualify.     Additional Screening:  Hepatitis C Screening: does qualify; Completed 5/32017  Vision Screening: Recommended annual ophthalmology exams for early detection of glaucoma and other disorders of the eye. Is the patient up to date with their annual eye exam?  Yes  Who is the provider or what is the name of the office in which the pt  attends annual eye exams? Dr.Nice   Dental Screening: Recommended annual  dental exams for proper oral hygiene  Community Resource Referral:  CRR required this visit?  No       Plan:  I have personally reviewed and addressed the Medicare Annual Wellness questionnaire and have noted the following in the patient's chart:  A. Medical and social history B. Use of alcohol, tobacco or illicit drugs  C. Current medications and supplements D. Functional ability and status E.  Nutritional status F.  Physical activity G. Advance directives H. List of other physicians I.  Hospitalizations, surgeries, and ER visits in previous 12 months J.  [place] such as hearing and vision if needed, cognitive and depression L. Referrals and appointments   In addition, I have reviewed and discussed with patient certain preventive protocols, quality metrics, and best practice recommendations. A written personalized care plan for preventive services as well as general preventive health recommendations were provided to patient.  Signed,    Bevelyn Ngo, LPN  [id number] Nurse Health Advisor   Nurse Notes: none

## 2019-08-08 NOTE — Patient Instructions (Addendum)
Janet Taylor , Thank you for taking time to come for your Medicare Wellness Visit. I appreciate your ongoing commitment to your health goals. Please review the following plan we discussed and let me know if I can assist you in the future.   Screening recommendations/referrals: Colonoscopy: completed 2018 Mammogram: scheduled for 08/14/2019 Bone Density: up to date Recommended yearly ophthalmology/optometry visit for glaucoma screening and checkup Recommended yearly dental visit for hygiene and checkup  Vaccinations: Influenza vaccine: due now, please get at next office visit or at your local pharmacy Pneumococcal vaccine: up to date Tdap vaccine: due, check with your insurance for coverage  Shingles vaccine: shingrix eligible, check with your insurance for coverage     Advanced directives: Please bring a copy of your health care power of attorney and living will to the office at your convenience.  Conditions/risks identified: hypertension; discussed lowering sodium in diet.   Next appointment: Follow up in one year for your annual wellness visit    Preventive Care 65 Years and Older, Female Preventive care refers to lifestyle choices and visits with your health care provider that can promote health and wellness. What does preventive care include?  A yearly physical exam. This is also called an annual well check.  Dental exams once or twice a year.  Routine eye exams. Ask your health care provider how often you should have your eyes checked.  Personal lifestyle choices, including:  Daily care of your teeth and gums.  Regular physical activity.  Eating a healthy diet.  Avoiding tobacco and drug use.  Limiting alcohol use.  Practicing safe sex.  Taking low-dose aspirin every day.  Taking vitamin and mineral supplements as recommended by your health care provider. What happens during an annual well check? The services and screenings done by your health care provider during  your annual well check will depend on your age, overall health, lifestyle risk factors, and family history of disease. Counseling  Your health care provider may ask you questions about your:  Alcohol use.  Tobacco use.  Drug use.  Emotional well-being.  Home and relationship well-being.  Sexual activity.  Eating habits.  History of falls.  Memory and ability to understand (cognition).  Work and work Statistician.  Reproductive health. Screening  You may have the following tests or measurements:  Height, weight, and BMI.  Blood pressure.  Lipid and cholesterol levels. These may be checked every 5 years, or more frequently if you are over 56 years old.  Skin check.  Lung cancer screening. You may have this screening every year starting at age 81 if you have a 30-pack-year history of smoking and currently smoke or have quit within the past 15 years.  Fecal occult blood test (FOBT) of the stool. You may have this test every year starting at age 17.  Flexible sigmoidoscopy or colonoscopy. You may have a sigmoidoscopy every 5 years or a colonoscopy every 10 years starting at age 26.  Hepatitis C blood test.  Hepatitis B blood test.  Sexually transmitted disease (STD) testing.  Diabetes screening. This is done by checking your blood sugar (glucose) after you have not eaten for a while (fasting). You may have this done every 1-3 years.  Bone density scan. This is done to screen for osteoporosis. You may have this done starting at age 20.  Mammogram. This may be done every 1-2 years. Talk to your health care provider about how often you should have regular mammograms. Talk with your health care provider  about your test results, treatment options, and if necessary, the need for more tests. Vaccines  Your health care provider may recommend certain vaccines, such as:  Influenza vaccine. This is recommended every year.  Tetanus, diphtheria, and acellular pertussis (Tdap,  Td) vaccine. You may need a Td booster every 10 years.  Zoster vaccine. You may need this after age 27.  Pneumococcal 13-valent conjugate (PCV13) vaccine. One dose is recommended after age 55.  Pneumococcal polysaccharide (PPSV23) vaccine. One dose is recommended after age 23. Talk to your health care provider about which screenings and vaccines you need and how often you need them. This information is not intended to replace advice given to you by your health care provider. Make sure you discuss any questions you have with your health care provider. Document Released: 12/25/2015 Document Revised: 08/17/2016 Document Reviewed: 09/29/2015 Elsevier Interactive Patient Education  2017 [employer] Prevention in the Home Falls can cause injuries. They can happen to people of all ages. There are many things you can do to make your home safe and to help prevent falls. What can I do on the outside of my home?  Regularly fix the edges of walkways and driveways and fix any cracks.  Remove anything that might make you trip as you walk through a door, such as a raised step or threshold.  Trim any bushes or trees on the path to your home.  Use bright outdoor lighting.  Clear any walking paths of anything that might make someone trip, such as rocks or tools.  Regularly check to see if handrails are loose or broken. Make sure that both sides of any steps have handrails.  Any raised decks and porches should have guardrails on the edges.  Have any leaves, snow, or ice cleared regularly.  Use sand or salt on walking paths during winter.  Clean up any spills in your garage right away. This includes oil or grease spills. What can I do in the bathroom?  Use night lights.  Install grab bars by the toilet and in the tub and shower. Do not use towel bars as grab bars.  Use non-skid mats or decals in the tub or shower.  If you need to sit down in the shower, use a plastic, non-slip stool.   Keep the floor dry. Clean up any water that spills on the floor as soon as it happens.  Remove soap buildup in the tub or shower regularly.  Attach bath mats securely with double-sided non-slip rug tape.  Do not have throw rugs and other things on the floor that can make you trip. What can I do in the bedroom?  Use night lights.  Make sure that you have a light by your bed that is easy to reach.  Do not use any sheets or blankets that are too big for your bed. They should not hang down onto the floor.  Have a firm chair that has side arms. You can use this for support while you get dressed.  Do not have throw rugs and other things on the floor that can make you trip. What can I do in the kitchen?  Clean up any spills right away.  Avoid walking on wet floors.  Keep items that you use a lot in easy-to-reach places.  If you need to reach something above you, use a strong step stool that has a grab bar.  Keep electrical cords out of the way.  Do not use floor polish or  wax that makes floors slippery. If you must use wax, use non-skid floor wax.  Do not have throw rugs and other things on the floor that can make you trip. What can I do with my stairs?  Do not leave any items on the stairs.  Make sure that there are handrails on both sides of the stairs and use them. Fix handrails that are broken or loose. Make sure that handrails are as long as the stairways.  Check any carpeting to make sure that it is firmly attached to the stairs. Fix any carpet that is loose or worn.  Avoid having throw rugs at the top or bottom of the stairs. If you do have throw rugs, attach them to the floor with carpet tape.  Make sure that you have a light switch at the top of the stairs and the bottom of the stairs. If you do not have them, ask someone to add them for you. What else can I do to help prevent falls?  Wear shoes that:  Do not have high heels.  Have rubber bottoms.  Are  comfortable and fit you well.  Are closed at the toe. Do not wear sandals.  If you use a stepladder:  Make sure that it is fully opened. Do not climb a closed stepladder.  Make sure that both sides of the stepladder are locked into place.  Ask someone to hold it for you, if possible.  Clearly mark and make sure that you can see:  Any grab bars or handrails.  First and last steps.  Where the edge of each step is.  Use tools that help you move around (mobility aids) if they are needed. These include:  Canes.  Walkers.  Scooters.  Crutches.  Turn on the lights when you go into a dark area. Replace any light bulbs as soon as they burn out.  Set up your furniture so you have a clear path. Avoid moving your furniture around.  If any of your floors are uneven, fix them.  If there are any pets around you, be aware of where they are.  Review your medicines with your doctor. Some medicines can make you feel dizzy. This can increase your chance of falling. Ask your doctor what other things that you can do to help prevent falls. This information is not intended to replace advice given to you by your health care provider. Make sure you discuss any questions you have with your health care provider. Document Released: 09/24/2009 Document Revised: 05/05/2016 Document Reviewed: 01/02/2015 Elsevier Interactive Patient Education  2017 [employer].

## 2019-08-14 ENCOUNTER — Ambulatory Visit
Admission: RE | Admit: 2019-08-14 | Discharge: 2019-08-14 | Disposition: A | Discharge status: Home / Self Care | Payer: Medicare Other | Source: Ambulatory Visit | Attending: Nurse Practitioner | Admitting: Nurse Practitioner

## 2019-08-14 ENCOUNTER — Other Ambulatory Visit: Payer: Self-pay | Admitting: Internal Medicine

## 2019-08-14 DIAGNOSIS — R6889 Other general symptoms and signs: Secondary | ICD-10-CM | POA: Diagnosis not present

## 2019-08-14 DIAGNOSIS — Z1231 Encounter for screening mammogram for malignant neoplasm of breast: Secondary | ICD-10-CM | POA: Diagnosis not present

## 2019-08-14 DIAGNOSIS — Z20822 Contact with and (suspected) exposure to covid-19: Secondary | ICD-10-CM

## 2019-08-15 ENCOUNTER — Ambulatory Visit (INDEPENDENT_AMBULATORY_CARE_PROVIDER_SITE_OTHER): Payer: Medicare Other | Admitting: Nurse Practitioner

## 2019-08-15 ENCOUNTER — Other Ambulatory Visit: Payer: Self-pay

## 2019-08-15 ENCOUNTER — Encounter: Payer: Self-pay | Admitting: Nurse Practitioner

## 2019-08-15 VITALS — BP 128/84 | HR 88 | Temp 96.8°F | Wt 169.0 lb

## 2019-08-15 DIAGNOSIS — E7800 Pure hypercholesterolemia, unspecified: Secondary | ICD-10-CM | POA: Diagnosis not present

## 2019-08-15 DIAGNOSIS — I10 Essential (primary) hypertension: Secondary | ICD-10-CM

## 2019-08-15 LAB — NOVEL CORONAVIRUS, NAA: SARS-CoV-2, NAA: NOT DETECTED

## 2019-08-15 MED ORDER — HYDROCHLOROTHIAZIDE 25 MG PO TABS: 25.0000 mg | ORAL_TABLET | Freq: Every day | ORAL | 3 refills | Status: DC

## 2019-08-15 NOTE — Assessment & Plan Note (Signed)
Chronic, stable with BP at goal at home on readings.  Continue daily BP checks at home and current medication regimen + collaboration with cardiology.  Return in 6 months for annual physical.

## 2019-08-15 NOTE — Progress Notes (Signed)
BP 128/84 (BP Location: Left Arm, Patient Position: Sitting, Cuff Size: Normal)   Pulse 88   Temp (!) 96.8 F (36 C) (Oral)   Wt 169 lb (76.7 kg)   LMP  (LMP Unknown)   BMI 29.47 kg/m    Subjective:    Patient ID: Janet Taylor, female    DOB: [birth year], 73 y.o.   MRN: [id number]  HPI: Janet Taylor is a 73 y.o. female  Chief Complaint  Patient presents with  . Hypertension  . Hyperlipidemia    . This visit was completed via FaceTime due to the restrictions of the COVID-19 pandemic. All issues as above were discussed and addressed. Physical exam was done as above through visual confirmation on Facetime. If it was felt that the patient should be evaluated in the office, they were directed there. The patient verbally consented to this visit. . Location of the patient: home . Location of the provider: home . Those involved with this call:  . Provider: Marnee Guarneri, DNP . CMA: Yvonna Alanis, CMA . Front Desk/Registration: Jill Side  . Time spent on call: 15 minutes with patient face to face via video conference. More than 50% of this time was spent in counseling and coordination of care. 10 minutes total spent in review of patient's record and preparation of their chart.  . I verified patient identity using two factors (patient name and date of birth). Patient consents verbally to being seen via telemedicine visit today.    HYPERTENSION / HYPERLIPIDEMIA Continues on Metoprolol 50 MG daily, HCTZ 25 MG daily, and Crestor 5 MG Mon/Weds/Fri.  Followed by Dr. Nehemiah Massed and last seen 08/06/2019, no changes made. Started taking Crestor in March on a 3 day schedule due to muscle aches with Simvastatin.  Reports at first noted muscle aches to hands and knee with Crestor at 3 times a week, so went down to twice a week, then gradually worked back to three times a week and at this time reports is tolerating this medication well. Satisfied with current treatment? yes Duration of  hypertension: chronic BP monitoring frequency: daily BP range: 120/70 to 120/80's range wise BP medication side effects: no Duration of hyperlipidemia: chronic Cholesterol medication side effects: no Cholesterol supplements: none Medication compliance: good compliance Aspirin: no Recent stressors: no Recurrent headaches: no Visual changes: no Palpitations: no Dyspnea: no Chest pain: no Lower extremity edema: no Dizzy/lightheaded: no  The 10-year ASCVD risk score Mikey Bussing DC Jr., et al., 2013) is: 14.8%   Values used to calculate the score:     Age: 86 years     Sex: Female     Is Non-Hispanic African American: Yes     Diabetic: No     Tobacco smoker: No     Systolic Blood Pressure: 0000000 mmHg     Is BP treated: Yes     HDL Cholesterol: 58 mg/dL     Total Cholesterol: 224 mg/dL   Relevant past medical, surgical, family and social history reviewed and updated as indicated. Interim medical history since our last visit reviewed. Allergies and medications reviewed and updated.  Review of Systems  Constitutional: Negative for activity change, appetite change, diaphoresis, fatigue and fever.  Respiratory: Negative for cough, chest tightness and shortness of breath.   Cardiovascular: Negative for chest pain, palpitations and leg swelling.  Gastrointestinal: Negative for abdominal distention, abdominal pain, constipation, diarrhea, nausea and vomiting.  Neurological: Negative for dizziness, syncope, weakness, light-headedness, numbness and headaches.  Psychiatric/Behavioral: Negative.  Per HPI unless specifically indicated above     Objective:    BP 128/84 (BP Location: Left Arm, Patient Position: Sitting, Cuff Size: Normal)   Pulse 88   Temp (!) 96.8 F (36 C) (Oral)   Wt 169 lb (76.7 kg)   LMP  (LMP Unknown)   BMI 29.47 kg/m   Wt Readings from Last 3 Encounters:  08/15/19 169 lb (76.7 kg)  08/08/19 169 lb 9.6 oz (76.9 kg)  02/12/19 169 lb (76.7 kg)    Physical Exam  Vitals signs and nursing note reviewed.  Constitutional:      General: She is awake. She is not in acute distress.    Appearance: She is well-developed. She is not ill-appearing.  HENT:     Head: Normocephalic.     Right Ear: Hearing normal.     Left Ear: Hearing normal.  Eyes:     General: Lids are normal.        Right eye: No discharge.        Left eye: No discharge.     Conjunctiva/sclera: Conjunctivae normal.  Neck:     Musculoskeletal: Normal range of motion.  Pulmonary:     Effort: Pulmonary effort is normal. No accessory muscle usage or respiratory distress.  Neurological:     Mental Status: She is alert and oriented to person, place, and time.  Psychiatric:        Attention and Perception: Attention normal.        Mood and Affect: Mood normal.        Behavior: Behavior normal. Behavior is cooperative.        Thought Content: Thought content normal.        Judgment: Judgment normal.     Results for orders placed or performed in visit on 02/12/19  CBC with Differential/Platelet  Result Value Ref Range   WBC 6.3 3.4 - 10.8 x10E3/uL   RBC 5.08 3.77 - 5.28 x10E6/uL   Hemoglobin 14.8 11.1 - 15.9 g/dL   Hematocrit 46.1 34.0 - 46.6 %   MCV 91 79 - 97 fL   MCH 29.1 26.6 - 33.0 pg   MCHC 32.1 31.5 - 35.7 g/dL   RDW 13.4 11.7 - 15.4 %   Platelets 350 150 - 450 x10E3/uL   Neutrophils 51 Not Estab. %   Lymphs 37 Not Estab. %   Monocytes 6 Not Estab. %   Eos 5 Not Estab. %   Basos 1 Not Estab. %   Neutrophils Absolute 3.3 1.4 - 7.0 x10E3/uL   Lymphocytes Absolute 2.3 0.7 - 3.1 x10E3/uL   Monocytes Absolute 0.4 0.1 - 0.9 x10E3/uL   EOS (ABSOLUTE) 0.3 0.0 - 0.4 x10E3/uL   Basophils Absolute 0.0 0.0 - 0.2 x10E3/uL   Immature Granulocytes 0 Not Estab. %   Immature Grans (Abs) 0.0 0.0 - 0.1 x10E3/uL  Comprehensive metabolic panel  Result Value Ref Range   Glucose 90 65 - 99 mg/dL   BUN 10 8 - 27 mg/dL   Creatinine, Ser 0.72 0.57 - 1.00 mg/dL   GFR calc non Af Amer 85  >59 mL/min/1.73   GFR calc Af Amer 97 >59 mL/min/1.73   BUN/Creatinine Ratio 14 12 - 28   Sodium 138 134 - 144 mmol/L   Potassium 4.1 3.5 - 5.2 mmol/L   Chloride 98 96 - 106 mmol/L   CO2 24 20 - 29 mmol/L   Calcium 10.0 8.7 - 10.3 mg/dL   Total Protein 7.0 6.0 - 8.5 g/dL  Albumin 4.5 3.7 - 4.7 g/dL   Globulin, Total 2.5 1.5 - 4.5 g/dL   Albumin/Globulin Ratio 1.8 1.2 - 2.2   Bilirubin Total 0.3 0.0 - 1.2 mg/dL   Alkaline Phosphatase 74 39 - 117 IU/L   AST 18 0 - 40 IU/L   ALT 28 0 - 32 IU/L  Lipid Panel w/o Chol/HDL Ratio  Result Value Ref Range   Cholesterol, Total 224 (H) 100 - 199 mg/dL   Triglycerides 110 0 - 149 mg/dL   HDL 58 >39 mg/dL   VLDL Cholesterol Cal 22 5 - 40 mg/dL   LDL Calculated 144 (H) 0 - 99 mg/dL  TSH  Result Value Ref Range   TSH 1.100 0.450 - 4.500 uIU/mL      Assessment & Plan:   Problem List Items Addressed This Visit      Cardiovascular and Mediastinum   Hypertension    Chronic, stable with BP at goal at home on readings.  Continue daily BP checks at home and current medication regimen + collaboration with cardiology.  Return in 6 months for annual physical.        Other   Hyperlipidemia - Primary    Chronic, ongoing.  Tolerating low dose Crestor on 3 day week schedule, will continue this and have come in for outpatient labs to check CMP and lipid panel.  Continue focus on low cholesterol diet at home.  Return in 6 months for annual physical.      Relevant Orders   Comprehensive metabolic panel   Lipid Panel [place], [place]      I discussed the assessment and treatment plan with the patient. The patient was provided an opportunity to ask questions and all were answered. The patient agreed with the plan and demonstrated an understanding of the instructions.   The patient was advised to call back or seek an in-person evaluation if the symptoms worsen or if the condition fails to improve as anticipated.   I provided 15 minutes of time  during this encounter.  Follow up plan: Return in about 6 months (around 02/12/2020) for Annual physical.

## 2019-08-15 NOTE — Assessment & Plan Note (Signed)
Chronic, ongoing.  Tolerating low dose Crestor on 3 day week schedule, will continue this and have come in for outpatient labs to check CMP and lipid panel.  Continue focus on low cholesterol diet at home.  Return in 6 months for annual physical.

## 2019-08-15 NOTE — Patient Instructions (Signed)
Fat and Cholesterol Restricted Eating Plan Getting too much fat and cholesterol in your diet may cause health problems. Choosing the right foods helps keep your fat and cholesterol at normal levels. This can keep you from getting certain diseases. Your doctor may recommend an eating plan that includes:  Total fat: ______% or less of total calories a day.  Saturated fat: ______% or less of total calories a day.  Cholesterol: less than _________mg a day.  Fiber: ______g a day. What are tips for following this plan? Meal planning  At meals, divide your plate into four equal parts: ? Fill one-half of your plate with vegetables and green salads. ? Fill one-fourth of your plate with whole grains. ? Fill one-fourth of your plate with low-fat (lean) protein foods.  Eat fish that is high in omega-3 fats at least two times a week. This includes mackerel, tuna, sardines, and salmon.  Eat foods that are high in fiber, such as whole grains, beans, apples, broccoli, carrots, peas, and barley. General tips   Work with your doctor to lose weight if you need to.  Avoid: ? Foods with added sugar. ? Fried foods. ? Foods with partially hydrogenated oils.  Limit alcohol intake to no more than 1 drink a day for nonpregnant women and 2 drinks a day for men. One drink equals 12 oz of beer, 5 oz of wine, or 1 oz of hard liquor. Reading food labels  Check food labels for: ? Trans fats. ? Partially hydrogenated oils. ? Saturated fat (g) in each serving. ? Cholesterol (mg) in each serving. ? Fiber (g) in each serving.  Choose foods with healthy fats, such as: ? Monounsaturated fats. ? Polyunsaturated fats. ? Omega-3 fats.  Choose grain products that have whole grains. Look for the word "whole" as the first word in the ingredient list. Cooking  Cook foods using low-fat methods. These include baking, boiling, grilling, and broiling.  Eat more home-cooked foods. Eat at restaurants and buffets  less often.  Avoid cooking using saturated fats, such as butter, cream, palm oil, palm kernel oil, and coconut oil. Recommended foods  Fruits  All fresh, canned (in natural juice), or frozen fruits. Vegetables  Fresh or frozen vegetables (raw, steamed, roasted, or grilled). Green salads. Grains  Whole grains, such as whole wheat or whole grain breads, crackers, cereals, and pasta. Unsweetened oatmeal, bulgur, barley, quinoa, or brown rice. Corn or whole wheat flour tortillas. Meats and other protein foods  Ground beef (85% or leaner), grass-fed beef, or beef trimmed of fat. Skinless chicken or turkey. Ground chicken or turkey. Pork trimmed of fat. All fish and seafood. Egg whites. Dried beans, peas, or lentils. Unsalted nuts or seeds. Unsalted canned beans. Nut butters without added sugar or oil. Dairy  Low-fat or nonfat dairy products, such as skim or 1% milk, 2% or reduced-fat cheeses, low-fat and fat-free ricotta or cottage cheese, or plain low-fat and nonfat yogurt. Fats and oils  Tub margarine without trans fats. Light or reduced-fat mayonnaise and salad dressings. Avocado. Olive, canola, sesame, or safflower oils. The items listed above may not be a complete list of foods and beverages you can eat. Contact a dietitian for more information. Foods to avoid Fruits  Canned fruit in heavy syrup. Fruit in cream or butter sauce. Fried fruit. Vegetables  Vegetables cooked in cheese, cream, or butter sauce. Fried vegetables. Grains  White bread. White pasta. White rice. Cornbread. Bagels, pastries, and croissants. Crackers and snack foods that contain trans fat   and hydrogenated oils. Meats and other protein foods  Fatty cuts of meat. Ribs, chicken wings, bacon, sausage, bologna, salami, chitterlings, fatback, hot dogs, bratwurst, and packaged lunch meats. Liver and organ meats. Whole eggs and egg yolks. Chicken and turkey with skin. Fried meat. Dairy  Whole or 2% milk, cream,  half-and-half, and cream cheese. Whole milk cheeses. Whole-fat or sweetened yogurt. Full-fat cheeses. Nondairy creamers and whipped toppings. Processed cheese, cheese spreads, and cheese curds. Beverages  Alcohol. Sugar-sweetened drinks such as sodas, lemonade, and fruit drinks. Fats and oils  Butter, stick margarine, lard, shortening, ghee, or bacon fat. Coconut, palm kernel, and palm oils. Sweets and desserts  Corn syrup, sugars, honey, and molasses. Candy. Jam and jelly. Syrup. Sweetened cereals. Cookies, pies, cakes, donuts, muffins, and ice cream. The items listed above may not be a complete list of foods and beverages you should avoid. Contact a dietitian for more information. Summary  Choosing the right foods helps keep your fat and cholesterol at normal levels. This can keep you from getting certain diseases.  At meals, fill one-half of your plate with vegetables and green salads.  Eat high-fiber foods, like whole grains, beans, apples, carrots, peas, and barley.  Limit added sugar, saturated fats, alcohol, and fried foods. This information is not intended to replace advice given to you by your health care provider. Make sure you discuss any questions you have with your health care provider. Document Released: 05/29/2012 Document Revised: 08/01/2018 Document Reviewed: 08/15/2017 Elsevier Patient Education  2020 Elsevier Inc.  

## 2019-08-28 ENCOUNTER — Other Ambulatory Visit: Payer: Self-pay

## 2019-08-28 ENCOUNTER — Other Ambulatory Visit: Payer: Medicare Other

## 2019-08-28 DIAGNOSIS — E7800 Pure hypercholesterolemia, unspecified: Secondary | ICD-10-CM

## 2019-08-28 LAB — LIPID PANEL PICCOLO, WAIVED
Chol/HDL Ratio Piccolo,Waive: 3.1 mg/dL
Cholesterol Piccolo, Waived: 213 mg/dL — ABNORMAL HIGH (ref <200)
HDL Chol Piccolo, Waived: 69 mg/dL (ref >59)
LDL Chol Calc Piccolo Waived: 128 mg/dL — ABNORMAL HIGH (ref <100)
Triglycerides Piccolo,Waived: 81 mg/dL (ref <150)
VLDL Chol Calc Piccolo,Waive: 16 mg/dL (ref <30)

## 2019-08-29 LAB — COMPREHENSIVE METABOLIC PANEL
ALT: 25 IU/L (ref 0–32)
AST: 19 IU/L (ref 0–40)
Albumin/Globulin Ratio: 2 (ref 1.2–2.2)
Albumin: 4.3 g/dL (ref 3.7–4.7)
Alkaline Phosphatase: 71 IU/L (ref 39–117)
BUN/Creatinine Ratio: 8 — ABNORMAL LOW (ref 12–28)
BUN: 6 mg/dL — ABNORMAL LOW (ref 8–27)
Bilirubin Total: 0.3 mg/dL (ref 0.0–1.2)
CO2: 28 mmol/L (ref 20–29)
Calcium: 9.8 mg/dL (ref 8.7–10.3)
Chloride: 100 mmol/L (ref 96–106)
Creatinine, Ser: 0.75 mg/dL (ref 0.57–1.00)
GFR calc Af Amer: 92 mL/min/{1.73_m2} (ref >59)
GFR calc non Af Amer: 80 mL/min/{1.73_m2} (ref >59)
Globulin, Total: 2.1 g/dL (ref 1.5–4.5)
Glucose: 108 mg/dL — ABNORMAL HIGH (ref 65–99)
Potassium: 4.5 mmol/L (ref 3.5–5.2)
Sodium: 139 mmol/L (ref 134–144)
Total Protein: 6.4 g/dL (ref 6.0–8.5)

## 2019-09-13 ENCOUNTER — Telehealth: Payer: Self-pay

## 2019-09-30 ENCOUNTER — Other Ambulatory Visit: Payer: Self-pay

## 2019-09-30 ENCOUNTER — Ambulatory Visit (INDEPENDENT_AMBULATORY_CARE_PROVIDER_SITE_OTHER): Payer: Medicare Other

## 2019-09-30 DIAGNOSIS — Z23 Encounter for immunization: Secondary | ICD-10-CM

## 2019-10-08 DIAGNOSIS — H52223 Regular astigmatism, bilateral: Secondary | ICD-10-CM | POA: Diagnosis not present

## 2019-10-08 DIAGNOSIS — Z961 Presence of intraocular lens: Secondary | ICD-10-CM | POA: Diagnosis not present

## 2019-10-08 DIAGNOSIS — H5203 Hypermetropia, bilateral: Secondary | ICD-10-CM | POA: Diagnosis not present

## 2019-10-08 DIAGNOSIS — H524 Presbyopia: Secondary | ICD-10-CM | POA: Diagnosis not present

## 2019-10-08 DIAGNOSIS — H40013 Open angle with borderline findings, low risk, bilateral: Secondary | ICD-10-CM | POA: Diagnosis not present

## 2019-11-01 ENCOUNTER — Telehealth: Payer: Self-pay

## 2019-11-21 ENCOUNTER — Other Ambulatory Visit: Payer: Self-pay

## 2019-11-21 DIAGNOSIS — Z20822 Contact with and (suspected) exposure to covid-19: Secondary | ICD-10-CM

## 2019-11-21 DIAGNOSIS — Z20828 Contact with and (suspected) exposure to other viral communicable diseases: Secondary | ICD-10-CM | POA: Diagnosis not present

## 2019-11-23 LAB — NOVEL CORONAVIRUS, NAA: SARS-CoV-2, NAA: NOT DETECTED

## 2020-01-03 ENCOUNTER — Ambulatory Visit: Payer: Self-pay

## 2020-01-03 ENCOUNTER — Ambulatory Visit: Payer: Self-pay | Admitting: General Practice

## 2020-01-03 NOTE — Chronic Care Management (AMB) (Signed)
  Chronic Care Management   Outreach Note  01/03/2020 Name: Janet Taylor MRN: [id number] DOB: 1946-01-06  Referred by: Venita Lick, NP Reason for referral : Chronic Care Management (Initial Outreach: HTN/HLD)   An unsuccessful telephone outreach was attempted today. The patient was referred to the case management team by for assistance with care management and care coordination.   Follow Up Plan: A HIPPA compliant phone message was left for the patient providing contact information and requesting a return call.   Noreene Larsson RN, MSN, [facility] Mobile: [phone]

## 2020-01-03 NOTE — Chronic Care Management (AMB) (Signed)
  Chronic Care Management   Note  01/03/2020 Name: Janet Taylor MRN: [id number] DOB: September 05, 1946  Janet Taylor feels at this time her health conditions are stable and she is doing well.   Follow up plan: The patient has been provided with contact information for the care management team and has been advised to call with any health related questions or concerns.   Noreene Larsson RN, MSN, [facility] Mobile: [phone]

## 2020-01-03 NOTE — Patient Instructions (Signed)
  Chronic Care Management   Note  01/03/2020 Name: Janet Taylor MRN: [id number] DOB: 1945-12-14  [name], Thank you for taking time to talk with me today. I am happy to hear you are doing well. Please call with any changes in your health conditions or if you would like to engage with out Chronic Care Management Team in the future.   Follow up plan: The patient has been provided with contact information for the care management team and has been advised to call with any health related questions or concerns.   Noreene Larsson RN, MSN, [facility] Mobile: [phone]

## 2020-01-11 ENCOUNTER — Other Ambulatory Visit: Payer: Self-pay | Admitting: Nurse Practitioner

## 2020-02-04 ENCOUNTER — Telehealth: Payer: Self-pay

## 2020-02-06 DIAGNOSIS — H524 Presbyopia: Secondary | ICD-10-CM | POA: Diagnosis not present

## 2020-02-06 DIAGNOSIS — H5203 Hypermetropia, bilateral: Secondary | ICD-10-CM | POA: Diagnosis not present

## 2020-02-06 DIAGNOSIS — Z961 Presence of intraocular lens: Secondary | ICD-10-CM | POA: Diagnosis not present

## 2020-02-06 DIAGNOSIS — H401231 Low-tension glaucoma, bilateral, mild stage: Secondary | ICD-10-CM | POA: Diagnosis not present

## 2020-02-06 DIAGNOSIS — H52223 Regular astigmatism, bilateral: Secondary | ICD-10-CM | POA: Diagnosis not present

## 2020-02-26 ENCOUNTER — Ambulatory Visit: Payer: Medicare Other | Admitting: Pharmacist

## 2020-02-26 ENCOUNTER — Other Ambulatory Visit: Payer: Self-pay

## 2020-02-26 ENCOUNTER — Ambulatory Visit (INDEPENDENT_AMBULATORY_CARE_PROVIDER_SITE_OTHER): Payer: Medicare Other | Admitting: Nurse Practitioner

## 2020-02-26 ENCOUNTER — Encounter: Payer: Self-pay | Admitting: Nurse Practitioner

## 2020-02-26 VITALS — BP 126/86 | HR 81 | Temp 97.5°F | Ht 63.5 in | Wt 174.4 lb

## 2020-02-26 DIAGNOSIS — I10 Essential (primary) hypertension: Secondary | ICD-10-CM

## 2020-02-26 DIAGNOSIS — M85851 Other specified disorders of bone density and structure, right thigh: Secondary | ICD-10-CM

## 2020-02-26 DIAGNOSIS — E7800 Pure hypercholesterolemia, unspecified: Secondary | ICD-10-CM

## 2020-02-26 DIAGNOSIS — T466X5A Adverse effect of antihyperlipidemic and antiarteriosclerotic drugs, initial encounter: Secondary | ICD-10-CM | POA: Insufficient documentation

## 2020-02-26 DIAGNOSIS — Z789 Other specified health status: Secondary | ICD-10-CM

## 2020-02-26 DIAGNOSIS — E559 Vitamin D deficiency, unspecified: Secondary | ICD-10-CM

## 2020-02-26 NOTE — Assessment & Plan Note (Signed)
Has tried multiple statins to include Crestor, Atorvastatin, and Simvastatin.  Tried once weekly and a few day a week schedule with poor tolerance to all attempts.  Muscle pain with statin use makes ADLs difficult.  Consider Repatha, educated patient on this and provided information for her to take home and read, if continued elevations in lipid panel.

## 2020-02-26 NOTE — Patient Instructions (Signed)
Visit Information  Goals Addressed            This Visit's Progress     Patient Stated   . PharmD "I need help with my cholesterol" (pt-stated)       CARE PLAN ENTRY (see longtitudinal plan of care for additional care plan information)  Current Barriers:  . Uncontrolled hyperlipidemia, complicated by HTN, statin intolerance . Current antihyperlipidemic regimen: none . Previous antihyperlipidemic medications tried:  o Rosuvastatin 5 mg once, twice, andn three times weekly- muscle aches o Simvastatin 5 mg daily - muscle aches o Atorvastatin dose unknown- muscle aches o Ezetimibe 10 mg daily- muscle pain, flu like symptoms . Most recent lipid panel: LDL: 128, TG: 81, TC: 213, HDL: 69; updated panel pending today . ASCVD risk enhancing conditions: age >23, HTN . 10-year ASCVD risk score: 14.3%  Pharmacist Clinical Goal(s):  Marland Kitchen Over the next 90 days, patient will work with PharmD and providers towards optimized antihyperlipidemic therapy  Interventions: . Comprehensive medication review performed; medication list updated in electronic medical record.  . Given significant hx of statin and ezetimibe intolerance, appropriate to pursue PCSK9 coverage. Does not appear that we have [name], [name], [name], [name] . Counseled on PCSK9 role, side effects, frequency of administration. Patient amenable to pursuing coverage.   Patient Self Care Activities:  . Patient will focus on medication adherence   Initial goal documentation        Patient verbalizes understanding of instructions provided today.   Plan: - Will collaborate w/ patient and provider as above - Scheduled f/u call 04/08/20  Catie Darnelle Maffucci,  PharmD, [place] [phone]

## 2020-02-26 NOTE — Patient Instructions (Signed)
Evolocumab injection What is this medicine? EVOLOCUMAB (e voe LOK ue mab) is known as a PCSK9 inhibitor. It is used to lower the level of cholesterol in the blood. It may be used alone or in combination with other cholesterol-lowering drugs. This drug may also be used to reduce the risk of heart attack, stroke, and certain types of heart surgery in patients with heart disease. This medicine may be used for other purposes; ask your health care provider or pharmacist if you have questions. COMMON BRAND NAME(S): Repatha What should I tell my health care provider before I take this medicine? They need to know if you have any of these conditions:  an unusual or allergic reaction to evolocumab, other medicines, latex, foods, dyes, or preservatives  pregnant or trying to get pregnant  breast-feeding How should I use this medicine? This medicine is for injection under the skin. You will be taught how to prepare and give this medicine. Use exactly as directed. Take your medicine at regular intervals. Do not take your medicine more often than directed. It is important that you put your used needles and syringes in a special sharps container. Do not put them in a trash can. If you do not have a sharps container, call your pharmacist or health care provider to get one. Talk to your pediatrician regarding the use of this medicine in children. While this drug may be prescribed for children as young as 13 years for selected conditions, precautions do apply. Overdosage: If you think you have taken too much of this medicine contact a poison control center or emergency room at once. NOTE: This medicine is only for you. Do not share this medicine with others. What if I miss a dose? If you miss a dose, take it as soon as you can if there are more than 7 days until the next scheduled dose, or skip the missed dose and take the next dose according to your original schedule. Do not take double or extra doses. What may  interact with this medicine? Interactions are not expected. This list may not describe all possible interactions. Give your health care provider a list of all the medicines, herbs, non-prescription drugs, or dietary supplements you use. Also tell them if you smoke, drink alcohol, or use illegal drugs. Some items may interact with your medicine. What should I watch for while using this medicine? Visit your health care provider for regular checks on your progress. Tell your health care provider if your symptoms do not start to get better or if they get worse. You may need blood work done while you are taking this drug. Do not wear the on-body infuser during an MRI. What side effects may I notice from receiving this medicine? Side effects that you should report to your doctor or health care professional as soon as possible:  allergic reactions like skin rash, itching or hives, swelling of the face, lips, or tongue  signs and symptoms of high blood sugar such as dizziness; dry mouth; dry skin; fruity breath; nausea; stomach pain; increased hunger or thirst; increased urination  signs and symptoms of infection like fever or chills; cough; sore throat; pain or trouble passing urine Side effects that usually do not require medical attention (report to your doctor or health care professional if they continue or are bothersome):  diarrhea  nausea  muscle pain  pain, redness, or irritation at site where injected This list may not describe all possible side effects. Call your doctor for medical   advice about side effects. You may report side effects to FDA at 1-800-FDA-1088. Where should I keep my medicine? Keep out of the reach of children. You will be instructed on how to store this medicine. Throw away any unused medicine after the expiration date on the label. NOTE: This sheet is a summary. It may not cover all possible information. If you have questions about this medicine, talk to your doctor,  pharmacist, or health care provider.  2020 Elsevier/Gold Standard (2019-10-01 16:22:29)  

## 2020-02-26 NOTE — Assessment & Plan Note (Signed)
On DEXA 2020.  Recommend continued supplement daily, Vit D and Calcium + weight bearing exercises.  Repeat DEXA in 2025.  Check Vit D level today. 

## 2020-02-26 NOTE — Progress Notes (Signed)
BP 126/86   Pulse 81   Temp (!) 97.5 F (36.4 C) (Oral)   Ht 5' 3.5" (1.613 m)   Wt 174 lb 6.4 oz (79.1 kg)   LMP  (LMP Unknown)   SpO2 99%   BMI 30.41 kg/m    Subjective:    Patient ID: Janet Taylor, female    DOB: [birth year], 74 y.o.   MRN: [id number]  HPI: Janet Taylor is a 74 y.o. female presenting on 02/26/2020 for follow-up visit. Current medical complaints include:none  She currently lives with:self Menopausal Symptoms: no   HYPERTENSION / HYPERLIPIDEMIA Continues on Metoprolol 50 MG daily, HCTZ 25 MG daily. Followed by Dr. Nehemiah Massed and last seen 08/06/2019, no changes made. Started taking Crestor in March on a 3 day schedule due to muscle aches with Simvastatin.  Reports at first noted muscle aches to hands and knee with Crestor at 3 times a week, so went down to twice a week, then gradually worked back to three times a week and at this time reports is tolerating this medication well.  She stopped taking Crestor due to muscle aches, even with taking once or three times a week.  She could not walk without holding onto wall, the muscle aches were in hands and feet.   Satisfied with current treatment? yes Duration of hypertension: chronic BP monitoring frequency: daily BP range: 120/70 to 120/80's range wise BP medication side effects: no Duration of hyperlipidemia: chronic Cholesterol medication side effects: no Cholesterol supplements: none Medication compliance: good compliance Aspirin: no Recent stressors: no Recurrent headaches: no Visual changes: no Palpitations: no Dyspnea: no Chest pain: no Lower extremity edema: no Dizzy/lightheaded: no  The 10-year ASCVD risk score Mikey Bussing DC Jr., et al., 2013) is: 14.3%   Values used to calculate the score:     Age: 62 years     Sex: Female     Is Non-Hispanic African American: Yes     Diabetic: No     Tobacco smoker: No     Systolic Blood Pressure: 123XX123 mmHg     Is BP treated: Yes     HDL Cholesterol: 69 mg/dL  Total Cholesterol: 213 mg/dL   Depression Screen done today and results listed below:  Depression screen [facility] 2/9 02/26/2020 08/08/2019 02/12/2019 08/03/2018 07/26/2017  Decreased Interest 0 0 0 0 0  Down, Depressed, Hopeless 0 0 0 0 0  PHQ - 2 Score 0 0 0 0 0  Altered sleeping - - 1 - -  Tired, decreased energy - - 1 - -  Change in appetite - - 0 - -  Feeling bad or failure about yourself  - - 0 - -  Trouble concentrating - - 0 - -  Moving slowly or fidgety/restless - - 0 - -  Suicidal thoughts - - 0 - -  PHQ-9 Score - - 2 - -  Difficult doing work/chores - - Not difficult at all - -    The patient does not have a history of falls. I did not complete a risk assessment for falls. A plan of care for falls was not documented.   Past Medical History:  Past Medical History:  Diagnosis Date  . Eczema herpeticum   . Hyperlipidemia   . Hypertension   . Menopausal disorder   . Osteopenia     Surgical History:  Past Surgical History:  Procedure Laterality Date  . ABDOMINAL HYSTERECTOMY     complete  . BREAST BIOPSY Right 2015  NEG  . CATARACT EXTRACTION Right 10/03/2016  . cataract surgery Left    09/12/16  . COLONOSCOPY WITH PROPOFOL N/A 10/02/2017   Procedure: COLONOSCOPY WITH PROPOFOL;  Surgeon: Jonathon Bellows, MD;  Location: [facility] ENDOSCOPY;  Service: Gastroenterology;  Laterality: N/A;  . EYE SURGERY Bilateral 09/12/2016   bilateral cataract surgery   . EYE SURGERY  10/03/2016    Medications:  Current Outpatient Medications on File Prior to Visit  Medication Sig  . hydrochlorothiazide (HYDRODIURIL) 25 MG tablet Take 1 tablet (25 mg total) by mouth daily.  . hydrocortisone 2.5 % cream Apply topically 2 (two) times daily.  Marland Kitchen latanoprost (XALATAN) 0.005 % ophthalmic solution 1 drop at bedtime.  . metoprolol tartrate (LOPRESSOR) 50 MG tablet TAKE 1 TABLET BY MOUTH EVERY DAY  . rosuvastatin (CRESTOR) 5 MG tablet Take one tablet (5 MG) at HS on Monday, Wednesday, and Friday only.  (Patient not taking: Reported on 02/26/2020)   No current facility-administered medications on file prior to visit.    Allergies:  Allergies  Allergen Reactions  . Hydrocodone Nausea Only  . Lipitor [Atorvastatin] Other (See Comments)    "muscle aches"  . Morphine And Related Nausea Only  . Zetia [Ezetimibe] Other (See Comments)    "flu symptoms"    Social History:  Social History   Socioeconomic History  . Marital status: Divorced    Spouse name: Not on file  . Number of children: Not on file  . Years of education: Not on file  . Highest education level: Bachelor's degree (e.g., BA, AB, BS)  Occupational History  . Occupation: retired  Tobacco Use  . Smoking status: Never Smoker  . Smokeless tobacco: Never Used  Substance and Sexual Activity  . Alcohol use: Yes    Alcohol/week: 3.0 standard drinks    Types: 3 Glasses of wine per week    Comment: on weekends  . Drug use: No  . Sexual activity: Never  Other Topics Concern  . Not on file  Social History Narrative   Walks outside for 30 minutes 3 days a week, works outside in yard   Social Determinants of Radio broadcast assistant Strain:   . Difficulty of Paying Living Expenses:   Food Insecurity:   . Worried About Charity fundraiser in the Last Year:   . Arboriculturist in the Last Year:   Transportation Needs:   . Film/video editor (Medical):   Marland Kitchen Lack of Transportation (Non-Medical):   Physical Activity: Insufficiently Active  . Days of Exercise per Week: 3 days  . Minutes of Exercise per Session: 30 min  Stress:   . Feeling of Stress :   Social Connections:   . Frequency of Communication with Friends and Family:   . Frequency of Social Gatherings with Friends and Family:   . Attends Religious Services:   . Active Member of Clubs or Organizations:   . Attends Archivist Meetings:   Marland Kitchen Marital Status:   Intimate Partner Violence:   . Fear of Current or Ex-Partner:   . Emotionally  Abused:   Marland Kitchen Physically Abused:   . Sexually Abused:    Social History   Tobacco Use  Smoking Status Never Smoker  Smokeless Tobacco Never Used   Social History   Substance and Sexual Activity  Alcohol Use Yes  . Alcohol/week: 3.0 standard drinks  . Types: 3 Glasses of wine per week   Comment: on weekends    Family History:  Family History  Problem Relation Age of Onset  . Hypertension Father   . Hypertension Sister   . Breast cancer Neg Hx     Past medical history, surgical history, medications, allergies, family history and social history reviewed with patient today and changes made to appropriate areas of the chart.   Review of Systems - negative All other ROS negative except what is listed above and in the HPI.      Objective:    BP 126/86   Pulse 81   Temp (!) 97.5 F (36.4 C) (Oral)   Ht 5' 3.5" (1.613 m)   Wt 174 lb 6.4 oz (79.1 kg)   LMP  (LMP Unknown)   SpO2 99%   BMI 30.41 kg/m   Wt Readings from Last 3 Encounters:  02/26/20 174 lb 6.4 oz (79.1 kg)  08/15/19 169 lb (76.7 kg)  08/08/19 169 lb 9.6 oz (76.9 kg)    Physical Exam Constitutional:      General: She is awake. She is not in acute distress.    Appearance: She is well-developed. She is not ill-appearing.  HENT:     Head: Normocephalic and atraumatic.     Right Ear: Hearing, tympanic membrane, ear canal and external ear normal. No drainage.     Left Ear: Hearing, tympanic membrane, ear canal and external ear normal. No drainage.     Nose: Nose normal.     Right Sinus: No maxillary sinus tenderness or frontal sinus tenderness.     Left Sinus: No maxillary sinus tenderness or frontal sinus tenderness.     Mouth/Throat:     Mouth: Mucous membranes are moist.     Pharynx: Oropharynx is clear. Uvula midline. No pharyngeal swelling, oropharyngeal exudate or posterior oropharyngeal erythema.  Eyes:     General: Lids are normal.        Right eye: No discharge.        Left eye: No discharge.      Extraocular Movements: Extraocular movements intact.     Conjunctiva/sclera: Conjunctivae normal.     Pupils: Pupils are equal, round, and reactive to light.     Visual Fields: Right eye visual fields normal and left eye visual fields normal.  Neck:     Thyroid: No thyromegaly.     Vascular: No carotid bruit.     Trachea: Trachea normal.  Cardiovascular:     Rate and Rhythm: Normal rate and regular rhythm.     Heart sounds: Normal heart sounds. No murmur. No gallop.   Pulmonary:     Effort: Pulmonary effort is normal. No accessory muscle usage or respiratory distress.     Breath sounds: Normal breath sounds.  Chest:     Breasts:        Right: Normal.        Left: Normal.    Abdominal:     General: Bowel sounds are normal.     Palpations: Abdomen is soft. There is no hepatomegaly or splenomegaly.     Tenderness: There is no abdominal tenderness.  Musculoskeletal:        General: Normal range of motion.     Cervical back: Normal range of motion and neck supple.     Right lower leg: No edema.     Left lower leg: No edema.  Lymphadenopathy:     Head:     Right side of head: No submental, submandibular, tonsillar, preauricular or posterior auricular adenopathy.     Left side of head: No  submental, submandibular, tonsillar, preauricular or posterior auricular adenopathy.     Cervical: No cervical adenopathy.     Upper Body:     Right upper body: No supraclavicular, axillary or pectoral adenopathy.     Left upper body: No supraclavicular, axillary or pectoral adenopathy.  Skin:    General: Skin is warm and dry.     Capillary Refill: Capillary refill takes less than 2 seconds.     Findings: No rash.  Neurological:     Mental Status: She is alert and oriented to person, place, and time.     Cranial Nerves: Cranial nerves are intact.     Gait: Gait is intact.     Deep Tendon Reflexes: Reflexes are normal and symmetric.     Reflex Scores:      Brachioradialis reflexes are 2+ on  the right side and 2+ on the left side.      Patellar reflexes are 2+ on the right side and 2+ on the left side. Psychiatric:        Attention and Perception: Attention normal.        Mood and Affect: Mood normal.        Speech: Speech normal.        Behavior: Behavior normal. Behavior is cooperative.        Thought Content: Thought content normal.        Judgment: Judgment normal.     Results for orders placed or performed in visit on 11/21/19  Novel Coronavirus, NAA ([facility])   Specimen: Nasopharyngeal(NP) swabs in vial transport medium   NASOPHARYNGE  TESTING  Result Value Ref Range   SARS-CoV-2, NAA Not Detected Not Detected      Assessment & Plan:   Problem List Items Addressed This Visit      Cardiovascular and Mediastinum   Hypertension - Primary    Chronic, stable with BP at goal at home on readings and in office today.  Continue daily BP checks at home and current medication regimen + collaboration with cardiology.  Return in 6 months for follow-up.      Relevant Orders   CBC with Differential/Platelet   Comprehensive metabolic panel   TSH     Musculoskeletal and Integument   Osteopenia    On DEXA 2020.  Recommend continued supplement daily, Vit D and Calcium + weight bearing exercises.  Repeat DEXA in 2025.  Check Vit D level today.      Relevant Orders   VITAMIN D 25 Hydroxy (Vit-D Deficiency, Fractures)     Other   Hyperlipidemia    Chronic, ongoing.  Poor tolerance of multiple statins including Crestor, Atorvastatin, and Simvastatin.  Has tried once weekly and a few times a week dosing with ongoing muscle pain present.  Continue focus on low cholesterol diet at home at this time.  Discussed Repatha, she is interested in this.  Check lipid panel today and consider initiation if ongoing elevations.  Return in 6 months, sooner if Repatha initiated.      Relevant Orders   Comprehensive metabolic panel   Lipid Panel w/o Chol/HDL Ratio   Statin intolerance     Has tried multiple statins to include Crestor, Atorvastatin, and Simvastatin.  Tried once weekly and a few day a week schedule with poor tolerance to all attempts.  Muscle pain with statin use makes ADLs difficult.  Consider Repatha, educated patient on this and provided information for her to take home and read, if continued elevations in lipid panel.  Other Visit Diagnoses    Vitamin D deficiency       History of low levels reported, continue supplement daily and check Vit D today.  Has underlying osteopenia.   Relevant Orders   VITAMIN D 25 Hydroxy (Vit-D Deficiency, Fractures)       Follow up plan: Return in about 6 months (around 08/28/2020) for HTN/HLD.   LABORATORY TESTING:  - Pap smear: not applicable  IMMUNIZATIONS:   - Tdap: Tetanus vaccination status reviewed: had Covid vaccine recently -- will obtain next visit. - Influenza: Up to date - Pneumovax: Up to date - Prevnar: Up to date - HPV: Not applicable - Zostavax vaccine: Up to date  SCREENING: -Mammogram: Up to date  - Colonoscopy: Up to date  - Bone Density: Up to date  -Hearing Test: Not applicable  -Spirometry: Not applicable   PATIENT COUNSELING:   Advised to take 1 mg of folate supplement per day if capable of pregnancy.   Sexuality: Discussed sexually transmitted diseases, partner selection, use of condoms, avoidance of unintended pregnancy  and contraceptive alternatives.   Advised to avoid cigarette smoking.  I discussed with the patient that most people either abstain from alcohol or drink within safe limits (<=14/week and <=4 drinks/occasion for males, <=7/weeks and <= 3 drinks/occasion for females) and that the risk for alcohol disorders and other health effects rises proportionally with the number of drinks per week and how often a drinker exceeds daily limits.  Discussed cessation/primary prevention of drug use and availability of treatment for abuse.   Diet: Encouraged to adjust caloric  intake to maintain  or achieve ideal body weight, to reduce intake of dietary saturated fat and total fat, to limit sodium intake by avoiding high sodium foods and not adding table salt, and to maintain adequate dietary potassium and calcium preferably from fresh fruits, vegetables, and low-fat dairy products.    stressed the importance of regular exercise  Injury prevention: Discussed safety belts, safety helmets, smoke detector, smoking near bedding or upholstery.   Dental health: Discussed importance of regular tooth brushing, flossing, and dental visits.    NEXT PREVENTATIVE PHYSICAL DUE IN 1 YEAR. Return in about 6 months (around 08/28/2020) for HTN/HLD.

## 2020-02-26 NOTE — Chronic Care Management (AMB) (Signed)
Chronic Care Management   Note  02/26/2020 Name: Janet Taylor MRN: [id number] DOB: 10-Aug-1946   Subjective:  Janet Taylor is a 74 y.o. year old female who is a primary care patient of Cannady, Barbaraann Faster, NP. The CCM team was consulted for assistance with chronic disease management and care coordination needs.    Met with patient face to face for medication access support.  Janet Taylor was given information about Chronic Care Management services today including:  1. CCM service includes personalized support from designated clinical staff supervised by her physician, including individualized plan of care and coordination with other care providers 2. 24/7 contact phone numbers for assistance for urgent and routine care needs. 3. Service will only be billed when office clinical staff spend 20 minutes or more in a month to coordinate care. 4. Only one practitioner may furnish and bill the service in a calendar month. 5. The patient may stop CCM services at any time (effective at the end of the month) by phone call to the office staff. 6. The patient will be responsible for cost sharing (co-pay) of up to 20% of the service fee (after annual deductible is met).  Patient agreed to services and verbal consent obtained.   Review of patient status, including review of consultants reports, laboratory and other test data, was performed as part of comprehensive evaluation and provision of chronic care management services.   SDOH (Social Determinants of Health) assessments and interventions performed:    Objective:  Lab Results  Component Value Date   CREATININE 0.75 08/28/2019   CREATININE 0.72 02/12/2019   CREATININE 0.85 08/14/2018    No results found for: HGBA1C     Component Value Date/Time   CHOL 213 (H) 08/28/2019 0952   TRIG 81 08/28/2019 0952   HDL 58 02/12/2019 1104   CHOLHDL 4.4 08/14/2018 1030   VLDL 16 08/28/2019 0952   LDLCALC 144 (H) 02/12/2019 1104    Clinical ASCVD: No    The 10-year ASCVD risk score Mikey Bussing DC Jr., et al., 2013) is: 14.3%   Values used to calculate the score:     Age: 29 years     Sex: Female     Is Non-Hispanic African American: Yes     Diabetic: No     Tobacco smoker: No     Systolic Blood Pressure: 841 mmHg     Is BP treated: Yes     HDL Cholesterol: 69 mg/dL     Total Cholesterol: 213 mg/dL    BP Readings from Last 3 Encounters:  02/26/20 126/86  08/15/19 128/84  08/08/19 132/89    Allergies  Allergen Reactions  . Hydrocodone Nausea Only  . Lipitor [Atorvastatin] Other (See Comments)    "muscle aches"  . Morphine And Related Nausea Only  . Zetia [Ezetimibe] Other (See Comments)    "flu symptoms"    Medications Reviewed Today    Reviewed by Venita Lick, NP (Nurse Practitioner) on 02/26/20 at 1234  Med List Status: <None>  Medication Order Taking? Sig Documenting Provider Last Dose Status Informant  hydrochlorothiazide (HYDRODIURIL) 25 MG tablet [id number] Yes Take 1 tablet (25 mg total) by mouth daily. Marnee Guarneri T, NP Taking Active   hydrocortisone 2.5 % cream [id number] Yes Apply topically 2 (two) times daily. Venita Lick, NP Taking Active            Med Note Queen Slough, KERI L   Thu Aug 15, 2019  9:39 AM) PRN Only  latanoprost (  XALATAN) 0.005 % ophthalmic solution [id number] Yes 1 drop at bedtime. [provider] Taking Active   metoprolol tartrate (LOPRESSOR) 50 MG tablet [id number] Yes TAKE 1 TABLET BY MOUTH EVERY DAY Cannady, Jolene T, NP Taking Active   rosuvastatin (CRESTOR) 5 MG tablet [id number] No Take one tablet (5 MG) at HS on Monday, Wednesday, and Friday only.  Patient not taking: Reported on 02/26/2020   Venita Lick, NP Not Taking Active            Assessment:   Goals Addressed            This Visit's Progress     Patient Stated   . PharmD "I need help with my cholesterol" (pt-stated)       CARE PLAN ENTRY (see longtitudinal plan of care for additional care plan  information)  Current Barriers:  . Uncontrolled hyperlipidemia, complicated by HTN, statin intolerance . Current antihyperlipidemic regimen: none . Previous antihyperlipidemic medications tried:  o Rosuvastatin 5 mg once, twice, andn three times weekly- muscle aches o Simvastatin 5 mg daily - muscle aches o Atorvastatin dose unknown- muscle aches o Ezetimibe 10 mg daily- muscle pain, flu like symptoms . Most recent lipid panel: LDL: 128, TG: 81, TC: 213, HDL: 69; updated panel pending today . ASCVD risk enhancing conditions: age >45, HTN . 10-year ASCVD risk score: 14.3%  Pharmacist Clinical Goal(s):  Marland Kitchen Over the next 90 days, patient will work with PharmD and providers towards optimized antihyperlipidemic therapy  Interventions: . Comprehensive medication review performed; medication list updated in electronic medical record.  . Given significant hx of statin and ezetimibe intolerance, appropriate to pursue PCSK9 coverage. Does not appear that we have [name], [name], [name], [name] . Counseled on PCSK9 role, side effects, frequency of administration. Patient amenable to pursuing coverage.   Patient Self Care Activities:  . Patient will focus on medication adherence   Initial goal documentation        Plan: - Will collaborate w/ patient and provider as above - Scheduled f/u call 04/08/20  Catie Darnelle Maffucci, PharmD, [place] [phone]

## 2020-02-26 NOTE — Assessment & Plan Note (Signed)
Chronic, ongoing.  Poor tolerance of multiple statins including Crestor, Atorvastatin, and Simvastatin.  Has tried once weekly and a few times a week dosing with ongoing muscle pain present.  Continue focus on low cholesterol diet at home at this time.  Discussed Repatha, she is interested in this.  Check lipid panel today and consider initiation if ongoing elevations.  Return in 6 months, sooner if Repatha initiated.

## 2020-02-26 NOTE — Assessment & Plan Note (Signed)
Chronic, stable with BP at goal at home on readings and in office today.  Continue daily BP checks at home and current medication regimen + collaboration with cardiology.  Return in 6 months for follow-up.

## 2020-02-27 ENCOUNTER — Encounter: Payer: Self-pay | Admitting: Nurse Practitioner

## 2020-02-27 DIAGNOSIS — H401231 Low-tension glaucoma, bilateral, mild stage: Secondary | ICD-10-CM | POA: Diagnosis not present

## 2020-02-27 DIAGNOSIS — H52223 Regular astigmatism, bilateral: Secondary | ICD-10-CM | POA: Diagnosis not present

## 2020-02-27 DIAGNOSIS — H5203 Hypermetropia, bilateral: Secondary | ICD-10-CM | POA: Diagnosis not present

## 2020-02-27 DIAGNOSIS — Z961 Presence of intraocular lens: Secondary | ICD-10-CM | POA: Diagnosis not present

## 2020-02-27 DIAGNOSIS — H524 Presbyopia: Secondary | ICD-10-CM | POA: Diagnosis not present

## 2020-02-27 DIAGNOSIS — E559 Vitamin D deficiency, unspecified: Secondary | ICD-10-CM | POA: Insufficient documentation

## 2020-02-27 LAB — VITAMIN D 25 HYDROXY (VIT D DEFICIENCY, FRACTURES): Vit D, 25-Hydroxy: 12.2 ng/mL — ABNORMAL LOW (ref 30.0–100.0)

## 2020-02-27 LAB — CBC WITH DIFFERENTIAL/PLATELET
Basophils Absolute: 0.1 10*3/uL (ref 0.0–0.2)
Basos: 1 %
EOS (ABSOLUTE): 0.4 10*3/uL (ref 0.0–0.4)
Eos: 6 %
Hematocrit: 45.5 % (ref 34.0–46.6)
Hemoglobin: 15.4 g/dL (ref 11.1–15.9)
Immature Grans (Abs): 0 10*3/uL (ref 0.0–0.1)
Immature Granulocytes: 0 %
Lymphocytes Absolute: 2.2 10*3/uL (ref 0.7–3.1)
Lymphs: 38 %
MCH: 31.1 pg (ref 26.6–33.0)
MCHC: 33.8 g/dL (ref 31.5–35.7)
MCV: 92 fL (ref 79–97)
Monocytes Absolute: 0.3 10*3/uL (ref 0.1–0.9)
Monocytes: 6 %
Neutrophils Absolute: 2.8 10*3/uL (ref 1.4–7.0)
Neutrophils: 49 %
Platelets: 357 10*3/uL (ref 150–450)
RBC: 4.95 x10E6/uL (ref 3.77–5.28)
RDW: 13.4 % (ref 11.7–15.4)
WBC: 5.8 10*3/uL (ref 3.4–10.8)

## 2020-02-27 LAB — COMPREHENSIVE METABOLIC PANEL
ALT: 25 IU/L (ref 0–32)
AST: 23 IU/L (ref 0–40)
Albumin/Globulin Ratio: 1.8 (ref 1.2–2.2)
Albumin: 4.4 g/dL (ref 3.7–4.7)
Alkaline Phosphatase: 71 IU/L (ref 39–117)
BUN/Creatinine Ratio: 12 (ref 12–28)
BUN: 8 mg/dL (ref 8–27)
Bilirubin Total: 0.2 mg/dL (ref 0.0–1.2)
CO2: 25 mmol/L (ref 20–29)
Calcium: 9.7 mg/dL (ref 8.7–10.3)
Chloride: 99 mmol/L (ref 96–106)
Creatinine, Ser: 0.69 mg/dL (ref 0.57–1.00)
GFR calc Af Amer: 101 mL/min/{1.73_m2} (ref >59)
GFR calc non Af Amer: 87 mL/min/{1.73_m2} (ref >59)
Globulin, Total: 2.5 g/dL (ref 1.5–4.5)
Glucose: 96 mg/dL (ref 65–99)
Potassium: 4.1 mmol/L (ref 3.5–5.2)
Sodium: 137 mmol/L (ref 134–144)
Total Protein: 6.9 g/dL (ref 6.0–8.5)

## 2020-02-27 LAB — LIPID PANEL W/O CHOL/HDL RATIO
Cholesterol, Total: 250 mg/dL — ABNORMAL HIGH (ref 100–199)
HDL: 58 mg/dL (ref >39)
LDL Chol Calc (NIH): 174 mg/dL — ABNORMAL HIGH (ref 0–99)
Triglycerides: 103 mg/dL (ref 0–149)
VLDL Cholesterol Cal: 18 mg/dL (ref 5–40)

## 2020-02-27 LAB — TSH: TSH: 1.16 u[IU]/mL (ref 0.450–4.500)

## 2020-02-27 NOTE — Progress Notes (Signed)
Contacted via MyChart The 10-year ASCVD risk score Mikey Bussing DC Jr., et al., 2013) is: 15.8%   Values used to calculate the score:     Age: 74 years     Sex: Female     Is Non-Hispanic African American: Yes     Diabetic: No     Tobacco smoker: No     Systolic Blood Pressure: 123XX123 mmHg     Is BP treated: Yes     HDL Cholesterol: 58 mg/dL     Total Cholesterol: 250 mg/dL

## 2020-02-28 ENCOUNTER — Telehealth: Payer: Self-pay

## 2020-02-28 ENCOUNTER — Other Ambulatory Visit: Payer: Self-pay | Admitting: Nurse Practitioner

## 2020-02-28 MED ORDER — REPATHA 140 MG/ML ~~LOC~~ SOSY: 140.0000 mg | PREFILLED_SYRINGE | SUBCUTANEOUS | 4 refills | Status: DC

## 2020-02-28 MED ORDER — CHOLECALCIFEROL 1.25 MG (50000 UT) PO TABS: 1.0000 | ORAL_TABLET | ORAL | 0 refills | Status: DC

## 2020-02-28 NOTE — Telephone Encounter (Signed)
Copied from [place] [id number]. Topic: General - Inquiry >> Feb 28, 2020 10:48 AM Richardo Priest, NT wrote: Reason for CRM: Patient called in stating she would like to know if she would be receiving medication for her vitamin D being so low, as well if she will be getting an epi pen for her cholesterol. Please advise.

## 2020-02-28 NOTE — Progress Notes (Signed)
Repatha and Vit d scripts.

## 2020-02-28 NOTE — Telephone Encounter (Signed)
Sent in scripts. Repatha may need a PA which we can do as she has not tolerated any statin, even on weekly dosing.

## 2020-03-02 NOTE — Telephone Encounter (Signed)
Patient notified

## 2020-03-06 ENCOUNTER — Ambulatory Visit: Payer: Medicare Other

## 2020-03-06 ENCOUNTER — Other Ambulatory Visit: Payer: Self-pay

## 2020-03-20 ENCOUNTER — Telehealth: Payer: Self-pay | Admitting: Nurse Practitioner

## 2020-03-20 ENCOUNTER — Other Ambulatory Visit: Payer: Self-pay

## 2020-03-20 ENCOUNTER — Ambulatory Visit: Payer: Medicare Other

## 2020-03-20 NOTE — Telephone Encounter (Signed)
Presented to office requesting assist with second Repatha injection, she requires these every 2 weeks for HLD.  PCP assisted with SQ injection to left upper arm.  Site intact post injection.  She will continue to need assist with this as is concerned with giving injection to self.

## 2020-03-31 ENCOUNTER — Other Ambulatory Visit: Payer: Self-pay | Admitting: Nurse Practitioner

## 2020-03-31 NOTE — Telephone Encounter (Signed)
Pt is scheduled for lab work on 04/23, would youlike to see the pt as well?

## 2020-03-31 NOTE — Telephone Encounter (Signed)
Called pt no answer, no vm set up.

## 2020-03-31 NOTE — Telephone Encounter (Signed)
Requested medication (s) are due for refill today: yes  Requested medication (s) are on the active medication list: yes  Last refill:  02/28/20  Future visit scheduled: yes  Notes to clinic:  not delegated    Requested Prescriptions  Pending Prescriptions Disp Refills   Vitamin D, Ergocalciferol, (DRISDOL) 1.25 MG (50000 UNIT) CAPS capsule [Pharmacy Med Name: VITAMIN D2 1.25MG (50,000 UNIT)] 8 capsule     Sig: TAKE 1 TABLET BY MOUTH ONCE A WEEK. FOR 8 WEEKS AND THEN STOP. RETURN TO OFFICE FOR LAB DRAW.      Endocrinology:  Vitamins - Vitamin D Supplementation Failed - 03/31/2020 11:17 AM      Failed - 50,000 IU strengths are not delegated      Failed - Phosphate in normal range and within 360 days    Phosphorus  Date Value Ref Range Status  03/06/2015 2.1 (L) mg/dL Final    Comment:    2.5-4.6 NOTE: New Reference Range  02/17/15           Failed - Vitamin D in normal range and within 360 days    Vit D, 25-Hydroxy  Date Value Ref Range Status  02/26/2020 12.2 (L) 30.0 - 100.0 ng/mL Final    Comment:    Vitamin D deficiency has been defined by the Institute of Medicine and an Endocrine Society practice guideline as a level of serum 25-OH vitamin D less than 20 ng/mL (1,2). The Endocrine Society went on to further define vitamin D insufficiency as a level between 21 and 29 ng/mL (2). 1. IOM (Institute of Medicine). 2010. Dietary reference    intakes for calcium and D. [place]: The    [employer]. 2. Holick MF, Binkley Worley, Bischoff-Ferrari HA, et al.    Evaluation, treatment, and prevention of vitamin D    deficiency: an Endocrine Society clinical practice    guideline. JCEM. 2011 Jul; 96(7):1911-30.           Passed - Ca in normal range and within 360 days    Calcium  Date Value Ref Range Status  02/26/2020 9.7 8.7 - 10.3 mg/dL Final   Calcium, Total  Date Value Ref Range Status  03/06/2015 8.6 (L) mg/dL Final    Comment:    8.9-10.3 NOTE:  New Reference Range  02/17/15           Passed - Valid encounter within last 12 months    Recent Outpatient Visits           1 month ago Essential hypertension   [place], Barbaraann Faster, NP   7 months ago Pure hypercholesterolemia   [employer], Custer T, NP   1 year ago Encounter for annual physical exam   [place], Henrine Screws T, NP   1 year ago Essential hypertension   [place] Trinna Post, PA-C   2 years ago Pure hypercholesterolemia   [place] Kathrine Haddock, NP       Future Appointments             In 5 months Cannady, Barbaraann Faster, NP [facility]

## 2020-04-01 NOTE — Telephone Encounter (Signed)
Called pt no answer °

## 2020-04-03 ENCOUNTER — Ambulatory Visit (INDEPENDENT_AMBULATORY_CARE_PROVIDER_SITE_OTHER): Payer: Medicare Other

## 2020-04-03 ENCOUNTER — Other Ambulatory Visit: Payer: Self-pay

## 2020-04-03 DIAGNOSIS — E7800 Pure hypercholesterolemia, unspecified: Secondary | ICD-10-CM

## 2020-04-03 NOTE — Progress Notes (Signed)
Repatha injection given SQ in right lower abdomin. Patient tolerated well with no issues or complaints.

## 2020-04-07 ENCOUNTER — Other Ambulatory Visit: Payer: Self-pay

## 2020-04-07 ENCOUNTER — Encounter: Payer: Self-pay | Admitting: Nurse Practitioner

## 2020-04-07 ENCOUNTER — Ambulatory Visit (INDEPENDENT_AMBULATORY_CARE_PROVIDER_SITE_OTHER): Payer: Medicare Other | Admitting: Nurse Practitioner

## 2020-04-07 VITALS — BP 120/80 | HR 67 | Temp 98.1°F | Wt 175.8 lb

## 2020-04-07 DIAGNOSIS — Z789 Other specified health status: Secondary | ICD-10-CM | POA: Diagnosis not present

## 2020-04-07 DIAGNOSIS — E7800 Pure hypercholesterolemia, unspecified: Secondary | ICD-10-CM | POA: Diagnosis not present

## 2020-04-07 DIAGNOSIS — E559 Vitamin D deficiency, unspecified: Secondary | ICD-10-CM | POA: Diagnosis not present

## 2020-04-07 LAB — LIPID PANEL PICCOLO, WAIVED
Chol/HDL Ratio Piccolo,Waive: 2.2 mg/dL
Cholesterol Piccolo, Waived: 143 mg/dL (ref <200)
HDL Chol Piccolo, Waived: 65 mg/dL (ref >59)
LDL Chol Calc Piccolo Waived: 59 mg/dL (ref <100)
Triglycerides Piccolo,Waived: 91 mg/dL (ref <150)
VLDL Chol Calc Piccolo,Waive: 18 mg/dL (ref <30)

## 2020-04-07 NOTE — Assessment & Plan Note (Signed)
Chronic, ongoing with recent addition of Repatha which she is tolerating well.  LDL today from 174 to now 59.  Praised for success and recommend continuing Repatha, will send refills as needed.  Return in 6 months.

## 2020-04-07 NOTE — Progress Notes (Signed)
BP 120/80   Pulse 67   Temp 98.1 F (36.7 C) (Oral)   Wt 175 lb 12.8 oz (79.7 kg)   LMP  (LMP Unknown)   SpO2 100%   BMI 30.65 kg/m    Subjective:    Patient ID: Janet Taylor, female    DOB: [birth year], 74 y.o.   MRN: [id number]  HPI: Janet Taylor is a 74 y.o. female  Chief Complaint  Patient presents with  . Follow-up   HYPERLIPIDEMIA Started on Repatha last visit, due to poor tolerance to statins.  Is tolerating Repatha well with no ADR.  Her recent LDL 174 and TCHOL 250.   Hyperlipidemia status: good compliance Satisfied with current treatment?  yes Side effects:  no Medication compliance: good compliance Past cholesterol meds: multiple statins  Supplements: none Aspirin:  no The 10-year ASCVD risk score Mikey Bussing DC Jr., et al., 2013) is: 14.6%   Values used to calculate the score:     Age: 29 years     Sex: Female     Is Non-Hispanic African American: Yes     Diabetic: No     Tobacco smoker: No     Systolic Blood Pressure: 123456 mmHg     Is BP treated: Yes     HDL Cholesterol: 58 mg/dL     Total Cholesterol: 250 mg/dL Chest pain:  no Coronary artery disease:  no Family history CAD:  no Family history early CAD:  no   VITAMIN D DEFICIENCY: Continues on weekly supplement due to recent Vit D level 12.2.  Did have osteopenia noted on recent DEXA scan in June 2020 with T Score -1.1.    Relevant past medical, surgical, family and social history reviewed and updated as indicated. Interim medical history since our last visit reviewed. Allergies and medications reviewed and updated.  Review of Systems  Constitutional: Negative for activity change, appetite change, diaphoresis, fatigue and fever.  Respiratory: Negative for cough, chest tightness and shortness of breath.   Cardiovascular: Negative for chest pain, palpitations and leg swelling.  Gastrointestinal: Negative.   Neurological: Negative.   Psychiatric/Behavioral: Negative.     Per HPI unless specifically  indicated above     Objective:    BP 120/80   Pulse 67   Temp 98.1 F (36.7 C) (Oral)   Wt 175 lb 12.8 oz (79.7 kg)   LMP  (LMP Unknown)   SpO2 100%   BMI 30.65 kg/m   Wt Readings from Last 3 Encounters:  04/07/20 175 lb 12.8 oz (79.7 kg)  02/26/20 174 lb 6.4 oz (79.1 kg)  08/15/19 169 lb (76.7 kg)    Physical Exam Vitals and nursing note reviewed.  Constitutional:      General: She is awake. She is not in acute distress.    Appearance: She is well-developed and overweight. She is not ill-appearing.  HENT:     Head: Normocephalic.     Right Ear: Hearing normal.     Left Ear: Hearing normal.  Eyes:     General: Lids are normal.        Right eye: No discharge.        Left eye: No discharge.     Conjunctiva/sclera: Conjunctivae normal.     Pupils: Pupils are equal, round, and reactive to light.  Neck:     Thyroid: No thyromegaly.     Vascular: No carotid bruit.  Cardiovascular:     Rate and Rhythm: Normal rate and regular rhythm.  Heart sounds: Normal heart sounds. No murmur. No gallop.   Pulmonary:     Effort: Pulmonary effort is normal. No accessory muscle usage or respiratory distress.     Breath sounds: Normal breath sounds.  Abdominal:     General: Bowel sounds are normal.     Palpations: Abdomen is soft. There is no hepatomegaly or splenomegaly.  Musculoskeletal:     Cervical back: Normal range of motion and neck supple.     Right lower leg: No edema.     Left lower leg: No edema.  Skin:    General: Skin is warm and dry.  Neurological:     Mental Status: She is alert and oriented to person, place, and time.  Psychiatric:        Attention and Perception: Attention normal.        Mood and Affect: Mood normal.        Speech: Speech normal.        Behavior: Behavior normal. Behavior is cooperative.        Thought Content: Thought content normal.     Results for orders placed or performed in visit on 02/26/20  CBC with Differential/Platelet  Result  Value Ref Range   WBC 5.8 3.4 - 10.8 x10E3/uL   RBC 4.95 3.77 - 5.28 x10E6/uL   Hemoglobin 15.4 11.1 - 15.9 g/dL   Hematocrit 45.5 34.0 - 46.6 %   MCV 92 79 - 97 fL   MCH 31.1 26.6 - 33.0 pg   MCHC 33.8 31.5 - 35.7 g/dL   RDW 13.4 11.7 - 15.4 %   Platelets 357 150 - 450 x10E3/uL   Neutrophils 49 Not Estab. %   Lymphs 38 Not Estab. %   Monocytes 6 Not Estab. %   Eos 6 Not Estab. %   Basos 1 Not Estab. %   Neutrophils Absolute 2.8 1.4 - 7.0 x10E3/uL   Lymphocytes Absolute 2.2 0.7 - 3.1 x10E3/uL   Monocytes Absolute 0.3 0.1 - 0.9 x10E3/uL   EOS (ABSOLUTE) 0.4 0.0 - 0.4 x10E3/uL   Basophils Absolute 0.1 0.0 - 0.2 x10E3/uL   Immature Granulocytes 0 Not Estab. %   Immature Grans (Abs) 0.0 0.0 - 0.1 x10E3/uL  Comprehensive metabolic panel  Result Value Ref Range   Glucose 96 65 - 99 mg/dL   BUN 8 8 - 27 mg/dL   Creatinine, Ser 0.69 0.57 - 1.00 mg/dL   GFR calc non Af Amer 87 >59 mL/min/1.73   GFR calc Af Amer 101 >59 mL/min/1.73   BUN/Creatinine Ratio 12 12 - 28   Sodium 137 134 - 144 mmol/L   Potassium 4.1 3.5 - 5.2 mmol/L   Chloride 99 96 - 106 mmol/L   CO2 25 20 - 29 mmol/L   Calcium 9.7 8.7 - 10.3 mg/dL   Total Protein 6.9 6.0 - 8.5 g/dL   Albumin 4.4 3.7 - 4.7 g/dL   Globulin, Total 2.5 1.5 - 4.5 g/dL   Albumin/Globulin Ratio 1.8 1.2 - 2.2   Bilirubin Total 0.2 0.0 - 1.2 mg/dL   Alkaline Phosphatase 71 39 - 117 IU/L   AST 23 0 - 40 IU/L   ALT 25 0 - 32 IU/L  Lipid Panel w/o Chol/HDL Ratio  Result Value Ref Range   Cholesterol, Total 250 (H) 100 - 199 mg/dL   Triglycerides 103 0 - 149 mg/dL   HDL 58 >39 mg/dL   VLDL Cholesterol Cal 18 5 - 40 mg/dL   LDL Chol Calc (NIH) 174 (  H) 0 - 99 mg/dL  TSH  Result Value Ref Range   TSH 1.160 0.450 - 4.500 uIU/mL  VITAMIN D 25 Hydroxy (Vit-D Deficiency, Fractures)  Result Value Ref Range   Vit D, 25-Hydroxy 12.2 (L) 30.0 - 100.0 ng/mL      Assessment & Plan:   Problem List Items Addressed This Visit      Other    Hyperlipidemia - Primary    Chronic, ongoing with recent addition of Repatha which she is tolerating well.  LDL today from 174 to now 59.  Praised for success and recommend continuing Repatha, will send refills as needed.  Return in 6 months.      Relevant Orders   Lipid Panel Piccol[place], Waived   Comprehensive metabolic panel   Statin intolerance    Tolerating Repatha with improved LDL, continue this regimen and adjust as needed.      Vitamin D deficiency    New diagnosis.  Continue weekly supplement at this time and recheck Vit D level this visit, if improved discontinue weekly Vit D and start daily Vitamin D 2000 units.  Discussed with patient.      Relevant Orders   VITAMIN D 25 Hydroxy (Vit-D Deficiency, Fractures)       Follow up plan: Return in about 6 months (around 10/07/2020).

## 2020-04-07 NOTE — Assessment & Plan Note (Signed)
New diagnosis.  Continue weekly supplement at this time and recheck Vit D level this visit, if improved discontinue weekly Vit D and start daily Vitamin D 2000 units.  Discussed with patient.

## 2020-04-07 NOTE — Assessment & Plan Note (Signed)
Tolerating Repatha with improved LDL, continue this regimen and adjust as needed. 

## 2020-04-07 NOTE — Patient Instructions (Signed)

## 2020-04-08 LAB — COMPREHENSIVE METABOLIC PANEL
ALT: 24 IU/L (ref 0–32)
AST: 21 IU/L (ref 0–40)
Albumin/Globulin Ratio: 1.9 (ref 1.2–2.2)
Albumin: 4.2 g/dL (ref 3.7–4.7)
Alkaline Phosphatase: 64 IU/L (ref 39–117)
BUN/Creatinine Ratio: 14 (ref 12–28)
BUN: 10 mg/dL (ref 8–27)
Bilirubin Total: 0.3 mg/dL (ref 0.0–1.2)
CO2: 28 mmol/L (ref 20–29)
Calcium: 9.4 mg/dL (ref 8.7–10.3)
Chloride: 101 mmol/L (ref 96–106)
Creatinine, Ser: 0.73 mg/dL (ref 0.57–1.00)
GFR calc Af Amer: 95 mL/min/{1.73_m2} (ref >59)
GFR calc non Af Amer: 83 mL/min/{1.73_m2} (ref >59)
Globulin, Total: 2.2 g/dL (ref 1.5–4.5)
Glucose: 81 mg/dL (ref 65–99)
Potassium: 3.8 mmol/L (ref 3.5–5.2)
Sodium: 141 mmol/L (ref 134–144)
Total Protein: 6.4 g/dL (ref 6.0–8.5)

## 2020-04-08 LAB — VITAMIN D 25 HYDROXY (VIT D DEFICIENCY, FRACTURES): Vit D, 25-Hydroxy: 31.8 ng/mL (ref 30.0–100.0)

## 2020-04-08 NOTE — Progress Notes (Signed)
Contacted via MyChart Good morning Ms. Belin.  Your kidney and liver function + electrolytes are at goal.  Vitamin D level has improved, I recommend once completed weekly dosing of Vitamin D you then transition to Vitamin D3 2000 units daily, this you can obtain in the vitamin section at many locations -- I personally buy the store brand which works just as well.  If any questions let me know.   Keep being awesome!! Kindest regards, Sonnia Strong

## 2020-04-10 ENCOUNTER — Ambulatory Visit: Payer: Medicare Other | Admitting: Pharmacist

## 2020-04-10 DIAGNOSIS — E7800 Pure hypercholesterolemia, unspecified: Secondary | ICD-10-CM

## 2020-04-10 DIAGNOSIS — Z789 Other specified health status: Secondary | ICD-10-CM

## 2020-04-10 NOTE — Patient Instructions (Addendum)
Visit Information  Goals Addressed            This Visit's Progress     Patient Stated   . COMPLETED: PharmD "I need help with my cholesterol" (pt-stated)       CARE PLAN ENTRY (see longtitudinal plan of care for additional care plan information)  Current Barriers:  . Uncontrolled hyperlipidemia, complicated by HTN, statin intolerance . Current antihyperlipidemic regimen: Repatha 140 mg Q2 weeks - has completed ~3 doses of medication. She notes she is confident in her abilities to inject the medication herself next Friday. Denies any side effects or issues since starting the medication . Previous antihyperlipidemic medications tried:  o Rosuvastatin 5 mg once, twice, and three times weekly- muscle aches o Simvastatin 5 mg daily - muscle aches o Atorvastatin dose unknown- muscle aches o Ezetimibe 10 mg daily- muscle pain, flu like symptoms . Most recent lipid panel: LDL 59, TC 142, HDL 65, TG 91 . ASCVD risk enhancing conditions: age >69, HTN  Pharmacist Clinical Goal(s):  Marland Kitchen Over the next 90 days, patient will work with PharmD and providers towards optimized antihyperlipidemic therapy  Interventions: . Comprehensive medication review performed, medication list updated in electronic medical record . Inter-disciplinary care team collaboration (see longitudinal plan of care) . Praised patient for commitment to medication adherence and willingness to learn how to inject the medication herself. Verbally reviewed injection technique. Reviewed that there are videos on the [facility] website if she wants a refresher before her next dose.  . Patient declines further pharmacy needs at this time.   Patient Self Care Activities:  . Patient will focus on medication adherence   Please see past updates related to this goal by clicking on the "Past Updates" button in the selected goal         Patient verbalizes understanding of instructions provided today.    Plan:  - Patient declines  follow up. She has my contact information for future questions or concerns  Catie Darnelle Maffucci, PharmD, [place]  [phone]

## 2020-04-10 NOTE — Chronic Care Management (AMB) (Signed)
Chronic Care Management   Follow Up Note   04/10/2020 Name: Janet Taylor MRN: [id number] DOB: 29-Jun-1946  Referred by: Venita Lick, NP Reason for referral : Chronic Care Management (Medication Management)   Janet Taylor is a 74 y.o. year old female who is a primary care patient of Cannady, Barbaraann Faster, NP. The CCM team was consulted for assistance with chronic disease management and care coordination needs.    Contacted patient for medication management review.   Review of patient status, including review of consultants reports, relevant laboratory and other test results, and collaboration with appropriate care team members and the patient's provider was performed as part of comprehensive patient evaluation and provision of chronic care management services.    SDOH (Social Determinants of Health) assessments performed: No See Care Plan activities for detailed interventions related to [facility])     Outpatient Encounter Medications as of 04/10/2020  Medication Sig Note  . Evolocumab (REPATHA) 140 MG/ML SOSY Inject 140 mg into the skin every 14 (fourteen) days.   . hydrochlorothiazide (HYDRODIURIL) 25 MG tablet Take 1 tablet (25 mg total) by mouth daily.   . metoprolol tartrate (LOPRESSOR) 50 MG tablet TAKE 1 TABLET BY MOUTH EVERY DAY   . Vitamin D, Ergocalciferol, (DRISDOL) 1.25 MG (50000 UNIT) CAPS capsule TAKE 1 TABLET BY MOUTH ONCE A WEEK. FOR 8 WEEKS AND THEN STOP. RETURN TO OFFICE FOR LAB DRAW.   . hydrocortisone 2.5 % cream Apply topically 2 (two) times daily. 08/15/2019: PRN Only  . latanoprost (XALATAN) 0.005 % ophthalmic solution 1 drop at bedtime.   . [DISCONTINUED] Cholecalciferol 1.25 MG (50000 UT) TABS Take 1 tablet by mouth once a week. For 8 weeks and then stop.  Return to office for lab draw.    No facility-administered encounter medications on file as of 04/10/2020.     Objective:   Goals Addressed            This Visit's Progress     Patient Stated   .  COMPLETED: PharmD "I need help with my cholesterol" (pt-stated)       CARE PLAN ENTRY (see longtitudinal plan of care for additional care plan information)  Current Barriers:  . Uncontrolled hyperlipidemia, complicated by HTN, statin intolerance . Current antihyperlipidemic regimen: Repatha 140 mg Q2 weeks - has completed ~3 doses of medication. She notes she is confident in her abilities to inject the medication herself next Friday. Denies any side effects or issues since starting the medication . Previous antihyperlipidemic medications tried:  o Rosuvastatin 5 mg once, twice, and three times weekly- muscle aches o Simvastatin 5 mg daily - muscle aches o Atorvastatin dose unknown- muscle aches o Ezetimibe 10 mg daily- muscle pain, flu like symptoms . Most recent lipid panel: LDL 59, TC 142, HDL 65, TG 91 . ASCVD risk enhancing conditions: age >80, HTN  Pharmacist Clinical Goal(s):  Marland Kitchen Over the next 90 days, patient will work with PharmD and providers towards optimized antihyperlipidemic therapy  Interventions: . Comprehensive medication review performed, medication list updated in electronic medical record . Inter-disciplinary care team collaboration (see longitudinal plan of care) . Praised patient for commitment to medication adherence and willingness to learn how to inject the medication herself. Verbally reviewed injection technique. Reviewed that there are videos on the [place] website if she wants a refresher before her next dose.  . Patient declines further pharmacy needs at this time.   Patient Self Care Activities:  . Patient will focus on medication  adherence   Please see past updates related to this goal by clicking on the "Past Updates" button in the selected goal          Plan:  - Patient declines follow up. She has my contact information for future questions or concerns  Catie Darnelle Maffucci, PharmD, [place] [phone]

## 2020-05-11 ENCOUNTER — Other Ambulatory Visit: Payer: Self-pay | Admitting: Nurse Practitioner

## 2020-05-11 DIAGNOSIS — B000 Eczema herpeticum: Secondary | ICD-10-CM

## 2020-05-12 NOTE — Telephone Encounter (Signed)
Requested medication (s) are due for refill today: yes  Requested medication (s) are on the active medication list: yes  Last refill:  04/22/2019  Future visit scheduled: yes  Notes to clinic:  Medication not assigned to a protocol, review manually   Requested Prescriptions  Pending Prescriptions Disp Refills   hydrocortisone 2.5 % cream [Pharmacy Med Name: HYDROCORTISONE 2.5% CREAM] 28.35 g 0    Sig: APPLY TO AFFECTED AREA TWICE A DAY      Off-Protocol Failed - 05/11/2020 11:59 AM      Failed - Medication not assigned to a protocol, review manually.      Passed - Valid encounter within last 12 months    Recent Outpatient Visits           1 month ago Pure hypercholesterolemia   [place], Barbaraann Faster, NP   2 months ago Essential hypertension   [place], Barbaraann Faster, NP   9 months ago Pure hypercholesterolemia   [place], Barbaraann Faster, NP   1 year ago Encounter for annual physical exam   [place], Barbaraann Faster, NP   1 year ago Essential hypertension   [place], [place], PA-C       Future Appointments             In 4 months Cannady, Barbaraann Faster, NP [facility]

## 2020-05-12 NOTE — Telephone Encounter (Signed)
Routing to provider  

## 2020-05-25 DIAGNOSIS — H52223 Regular astigmatism, bilateral: Secondary | ICD-10-CM | POA: Diagnosis not present

## 2020-05-25 DIAGNOSIS — H401231 Low-tension glaucoma, bilateral, mild stage: Secondary | ICD-10-CM | POA: Diagnosis not present

## 2020-05-25 DIAGNOSIS — Z961 Presence of intraocular lens: Secondary | ICD-10-CM | POA: Diagnosis not present

## 2020-05-25 DIAGNOSIS — H5203 Hypermetropia, bilateral: Secondary | ICD-10-CM | POA: Diagnosis not present

## 2020-05-25 DIAGNOSIS — H524 Presbyopia: Secondary | ICD-10-CM | POA: Diagnosis not present

## 2020-05-30 ENCOUNTER — Other Ambulatory Visit: Payer: Self-pay | Admitting: Nurse Practitioner

## 2020-05-30 DIAGNOSIS — B000 Eczema herpeticum: Secondary | ICD-10-CM

## 2020-05-30 NOTE — Telephone Encounter (Signed)
Requested Prescriptions  Pending Prescriptions Disp Refills  . hydrocortisone 2.5 % cream [Pharmacy Med Name: HYDROCORTISONE 2.5% CREAM] 28 g 0    Sig: APPLY TO AFFECTED AREA TWICE A DAY     Off-Protocol Failed - 05/30/2020  1:36 PM      Failed - Medication not assigned to a protocol, review manually.      Passed - Valid encounter within last 12 months    Recent Outpatient Visits          1 month ago Pure hypercholesterolemia   [place], Barbaraann Faster, NP   3 months ago Essential hypertension   [place], Barbaraann Faster, NP   9 months ago Pure hypercholesterolemia   [place], Barbaraann Faster, NP   1 year ago Encounter for annual physical exam   [place], Barbaraann Faster, NP   1 year ago Essential hypertension   [place], [place], PA-C      Future Appointments            In 4 months Cannady, Barbaraann Faster, NP [facility]

## 2020-06-01 ENCOUNTER — Telehealth: Payer: Self-pay

## 2020-06-01 ENCOUNTER — Other Ambulatory Visit: Payer: Self-pay | Admitting: Nurse Practitioner

## 2020-06-01 DIAGNOSIS — B000 Eczema herpeticum: Secondary | ICD-10-CM

## 2020-06-01 MED ORDER — HYDROCORTISONE 2.5 % EX CREA: TOPICAL_CREAM | CUTANEOUS | 2 refills | Status: DC

## 2020-06-01 NOTE — Telephone Encounter (Signed)
I have printed script for patient.

## 2020-06-01 NOTE — Telephone Encounter (Signed)
Patient notified

## 2020-06-01 NOTE — Telephone Encounter (Signed)
Copied from [place] [id number]. Topic: General - Other >> Jun 01, 2020  9:06 AM Yvette Rack wrote: Reason for CRM: Pt stated she would like to come to the office to pick up the Rx for hydrocortisone 2.5 % cream. Pt stated she I shaving a hard time getting her pharmacy to one that she needs. Pt stated she needs the Rx to include the name brand [facility] as it the only one that has worked for her in the past. Pt requests call back once Rx is ready for her to pick up.

## 2020-07-15 ENCOUNTER — Other Ambulatory Visit: Payer: Self-pay | Admitting: Nurse Practitioner

## 2020-07-16 DIAGNOSIS — R002 Palpitations: Secondary | ICD-10-CM | POA: Diagnosis not present

## 2020-07-16 DIAGNOSIS — I10 Essential (primary) hypertension: Secondary | ICD-10-CM | POA: Diagnosis not present

## 2020-07-16 DIAGNOSIS — E782 Mixed hyperlipidemia: Secondary | ICD-10-CM | POA: Diagnosis not present

## 2020-07-16 DIAGNOSIS — I340 Nonrheumatic mitral (valve) insufficiency: Secondary | ICD-10-CM | POA: Diagnosis not present

## 2020-07-17 ENCOUNTER — Other Ambulatory Visit: Payer: Self-pay | Admitting: Nurse Practitioner

## 2020-08-12 ENCOUNTER — Other Ambulatory Visit: Payer: Self-pay | Admitting: Nurse Practitioner

## 2020-08-12 DIAGNOSIS — Z1231 Encounter for screening mammogram for malignant neoplasm of breast: Secondary | ICD-10-CM

## 2020-08-26 ENCOUNTER — Other Ambulatory Visit: Payer: Self-pay

## 2020-08-26 ENCOUNTER — Ambulatory Visit
Admission: RE | Admit: 2020-08-26 | Discharge: 2020-08-26 | Disposition: A | Discharge status: Home / Self Care | Payer: Medicare Other | Source: Ambulatory Visit | Attending: Nurse Practitioner | Admitting: Nurse Practitioner

## 2020-08-26 DIAGNOSIS — Z1231 Encounter for screening mammogram for malignant neoplasm of breast: Secondary | ICD-10-CM

## 2020-08-27 ENCOUNTER — Other Ambulatory Visit: Payer: Self-pay | Admitting: Nurse Practitioner

## 2020-08-27 DIAGNOSIS — R928 Other abnormal and inconclusive findings on diagnostic imaging of breast: Secondary | ICD-10-CM

## 2020-08-27 DIAGNOSIS — R921 Mammographic calcification found on diagnostic imaging of breast: Secondary | ICD-10-CM

## 2020-08-28 ENCOUNTER — Ambulatory Visit (INDEPENDENT_AMBULATORY_CARE_PROVIDER_SITE_OTHER): Payer: Medicare Other

## 2020-08-28 ENCOUNTER — Ambulatory Visit: Payer: Medicare Other | Admitting: Nurse Practitioner

## 2020-08-28 VITALS — Ht 63.5 in | Wt 168.0 lb

## 2020-08-28 DIAGNOSIS — Z0000 Encounter for general adult medical examination without abnormal findings: Secondary | ICD-10-CM | POA: Diagnosis not present

## 2020-08-28 NOTE — Patient Instructions (Signed)
Janet Taylor , Thank you for taking time to come for your Medicare Wellness Visit. I appreciate your ongoing commitment to your health goals. Please review the following plan we discussed and let me know if I can assist you in the future.   Screening recommendations/referrals: Colonoscopy: completed 10/02/2017 Mammogram: completed 08/26/2020 Bone Density: completed 06/03/2019 Recommended yearly ophthalmology/optometry visit for glaucoma screening and checkup Recommended yearly dental visit for hygiene and checkup  Vaccinations: Influenza vaccine: due Pneumococcal vaccine: completed 04/13/2016 Tdap vaccine: decline Shingles vaccine: discussed   Covid-19: 01/27/2020, 01/06/2020  Advanced directives: Please bring a copy of your POA (Power of Attorney) and/or Living Will to your next appointment.   Conditions/risks identified: none  Next appointment: Follow up in one year for your annual wellness visit    Preventive Care 65 Years and Older, Female Preventive care refers to lifestyle choices and visits with your health care provider that can promote health and wellness. What does preventive care include?  A yearly physical exam. This is also called an annual well check.  Dental exams once or twice a year.  Routine eye exams. Ask your health care provider how often you should have your eyes checked.  Personal lifestyle choices, including:  Daily care of your teeth and gums.  Regular physical activity.  Eating a healthy diet.  Avoiding tobacco and drug use.  Limiting alcohol use.  Practicing safe sex.  Taking low-dose aspirin every day.  Taking vitamin and mineral supplements as recommended by your health care provider. What happens during an annual well check? The services and screenings done by your health care provider during your annual well check will depend on your age, overall health, lifestyle risk factors, and family history of disease. Counseling  Your health care  provider may ask you questions about your:  Alcohol use.  Tobacco use.  Drug use.  Emotional well-being.  Home and relationship well-being.  Sexual activity.  Eating habits.  History of falls.  Memory and ability to understand (cognition).  Work and work Statistician.  Reproductive health. Screening  You may have the following tests or measurements:  Height, weight, and BMI.  Blood pressure.  Lipid and cholesterol levels. These may be checked every 5 years, or more frequently if you are over 12 years old.  Skin check.  Lung cancer screening. You may have this screening every year starting at age 59 if you have a 30-pack-year history of smoking and currently smoke or have quit within the past 15 years.  Fecal occult blood test (FOBT) of the stool. You may have this test every year starting at age 71.  Flexible sigmoidoscopy or colonoscopy. You may have a sigmoidoscopy every 5 years or a colonoscopy every 10 years starting at age 35.  Hepatitis C blood test.  Hepatitis B blood test.  Sexually transmitted disease (STD) testing.  Diabetes screening. This is done by checking your blood sugar (glucose) after you have not eaten for a while (fasting). You may have this done every 1-3 years.  Bone density scan. This is done to screen for osteoporosis. You may have this done starting at age 1.  Mammogram. This may be done every 1-2 years. Talk to your health care provider about how often you should have regular mammograms. Talk with your health care provider about your test results, treatment options, and if necessary, the need for more tests. Vaccines  Your health care provider may recommend certain vaccines, such as:  Influenza vaccine. This is recommended every year.  Tetanus, diphtheria, and acellular pertussis (Tdap, Td) vaccine. You may need a Td booster every 10 years.  Zoster vaccine. You may need this after age 69.  Pneumococcal 13-valent conjugate (PCV13)  vaccine. One dose is recommended after age 46.  Pneumococcal polysaccharide (PPSV23) vaccine. One dose is recommended after age 18. Talk to your health care provider about which screenings and vaccines you need and how often you need them. This information is not intended to replace advice given to you by your health care provider. Make sure you discuss any questions you have with your health care provider. Document Released: 12/25/2015 Document Revised: 08/17/2016 Document Reviewed: 09/29/2015 Elsevier Interactive Patient Education  2017 [place] Prevention in the Home Falls can cause injuries. They can happen to people of all ages. There are many things you can do to make your home safe and to help prevent falls. What can I do on the outside of my home?  Regularly fix the edges of walkways and driveways and fix any cracks.  Remove anything that might make you trip as you walk through a door, such as a raised step or threshold.  Trim any bushes or trees on the path to your home.  Use bright outdoor lighting.  Clear any walking paths of anything that might make someone trip, such as rocks or tools.  Regularly check to see if handrails are loose or broken. Make sure that both sides of any steps have handrails.  Any raised decks and porches should have guardrails on the edges.  Have any leaves, snow, or ice cleared regularly.  Use sand or salt on walking paths during winter.  Clean up any spills in your garage right away. This includes oil or grease spills. What can I do in the bathroom?  Use night lights.  Install grab bars by the toilet and in the tub and shower. Do not use towel bars as grab bars.  Use non-skid mats or decals in the tub or shower.  If you need to sit down in the shower, use a plastic, non-slip stool.  Keep the floor dry. Clean up any water that spills on the floor as soon as it happens.  Remove soap buildup in the tub or shower  regularly.  Attach bath mats securely with double-sided non-slip rug tape.  Do not have throw rugs and other things on the floor that can make you trip. What can I do in the bedroom?  Use night lights.  Make sure that you have a light by your bed that is easy to reach.  Do not use any sheets or blankets that are too big for your bed. They should not hang down onto the floor.  Have a firm chair that has side arms. You can use this for support while you get dressed.  Do not have throw rugs and other things on the floor that can make you trip. What can I do in the kitchen?  Clean up any spills right away.  Avoid walking on wet floors.  Keep items that you use a lot in easy-to-reach places.  If you need to reach something above you, use a strong step stool that has a grab bar.  Keep electrical cords out of the way.  Do not use floor polish or wax that makes floors slippery. If you must use wax, use non-skid floor wax.  Do not have throw rugs and other things on the floor that can make you trip. What can I do  with my stairs?  Do not leave any items on the stairs.  Make sure that there are handrails on both sides of the stairs and use them. Fix handrails that are broken or loose. Make sure that handrails are as long as the stairways.  Check any carpeting to make sure that it is firmly attached to the stairs. Fix any carpet that is loose or worn.  Avoid having throw rugs at the top or bottom of the stairs. If you do have throw rugs, attach them to the floor with carpet tape.  Make sure that you have a light switch at the top of the stairs and the bottom of the stairs. If you do not have them, ask someone to add them for you. What else can I do to help prevent falls?  Wear shoes that:  Do not have high heels.  Have rubber bottoms.  Are comfortable and fit you well.  Are closed at the toe. Do not wear sandals.  If you use a stepladder:  Make sure that it is fully  opened. Do not climb a closed stepladder.  Make sure that both sides of the stepladder are locked into place.  Ask someone to hold it for you, if possible.  Clearly mark and make sure that you can see:  Any grab bars or handrails.  First and last steps.  Where the edge of each step is.  Use tools that help you move around (mobility aids) if they are needed. These include:  Canes.  Walkers.  Scooters.  Crutches.  Turn on the lights when you go into a dark area. Replace any light bulbs as soon as they burn out.  Set up your furniture so you have a clear path. Avoid moving your furniture around.  If any of your floors are uneven, fix them.  If there are any pets around you, be aware of where they are.  Review your medicines with your doctor. Some medicines can make you feel dizzy. This can increase your chance of falling. Ask your doctor what other things that you can do to help prevent falls. This information is not intended to replace advice given to you by your health care provider. Make sure you discuss any questions you have with your health care provider. Document Released: 09/24/2009 Document Revised: 05/05/2016 Document Reviewed: 01/02/2015 Elsevier Interactive Patient Education  2017 [employer].

## 2020-08-28 NOTE — Progress Notes (Signed)
I connected with [name] today by telephone and verified that I am speaking with the correct person using two identifiers. Location patient: home Location provider: work Persons participating in the virtual visit: [place], [place] LPN.   I discussed the limitations, risks, security and privacy concerns of performing an evaluation and management service by telephone and the availability of in person appointments. I also discussed with the patient that there may be a patient responsible charge related to this service. The patient expressed understanding and verbally consented to this telephonic visit.    Interactive audio and video telecommunications were attempted between this provider and patient, however failed, due to patient having technical difficulties OR patient did not have access to video capability.  We continued and completed visit with audio only.     Vital signs may be patient reported or missing.  Subjective:   Janet Taylor is a 74 y.o. female who presents for Medicare Annual (Subsequent) preventive examination.  Review of Systems     Cardiac Risk Factors include: advanced age (>34men, >28 women);dyslipidemia     Objective:    Today's Vitals   08/28/20 0941  Weight: 168 lb (76.2 kg)  Height: 5' 3.5" (1.613 m)   Body mass index is 29.29 kg/m.  Advanced Directives 08/28/2020 08/08/2019 08/03/2018 06/11/2018 10/02/2017 07/26/2017 05/07/2017  Does Patient Have a Medical Advance Directive? Yes No Yes No Yes Yes No  Type of Paramedic of [place];Living will - [place];Living will - [place];Living will [place];Living will -  Does patient want to make changes to medical advance directive? - - - - - - -  Copy of [place] in Chart? No - copy requested - No - copy requested - No - copy requested No - copy requested -  Would patient like information on creating a  medical advance directive? - - - No - Patient declined - - Yes (ED - Information included in AVS)    Current Medications (verified) Outpatient Encounter Medications as of 08/28/2020  Medication Sig  . hydrochlorothiazide (HYDRODIURIL) 25 MG tablet Take 1 tablet (25 mg total) by mouth daily.  . hydrocortisone 2.5 % cream APPLY TO AFFECTED AREA TWICE A DAY  . latanoprost (XALATAN) 0.005 % ophthalmic solution 1 drop at bedtime.  . metoprolol tartrate (LOPRESSOR) 50 MG tablet TAKE 1 TABLET BY MOUTH EVERY DAY  . REPATHA 140 MG/ML SOSY INJECT 140 MG INTO THE SKIN EVERY 14 (FOURTEEN) DAYS.  Marland Kitchen Vitamin D, Ergocalciferol, (DRISDOL) 1.25 MG (50000 UNIT) CAPS capsule TAKE 1 TABLET BY MOUTH ONCE A WEEK. FOR 8 WEEKS AND THEN STOP. RETURN TO OFFICE FOR LAB DRAW.   No facility-administered encounter medications on file as of 08/28/2020.    Allergies (verified) Hydrocodone, Lipitor [atorvastatin], Morphine and related, and Zetia [ezetimibe]   History: Past Medical History:  Diagnosis Date  . Eczema herpeticum   . Hyperlipidemia   . Hypertension   . Menopausal disorder   . Osteopenia    Past Surgical History:  Procedure Laterality Date  . ABDOMINAL HYSTERECTOMY     complete  . BREAST BIOPSY Right 2015   NEG  . CATARACT EXTRACTION Right 10/03/2016  . cataract surgery Left    09/12/16  . COLONOSCOPY WITH PROPOFOL N/A 10/02/2017   Procedure: COLONOSCOPY WITH PROPOFOL;  Surgeon: Jonathon Bellows, MD;  Location[facility] ENDOSCOPY;  Service: Gastroenterology;  Laterality: N/A;  . EYE SURGERY Bilateral 09/12/2016   bilateral cataract  surgery   . EYE SURGERY  10/03/2016   Family History  Problem Relation Age of Onset  . Hypertension Father   . Hypertension Sister   . Breast cancer Neg Hx    Social History   Socioeconomic History  . Marital status: Divorced    Spouse name: Not on file  . Number of children: Not on file  . Years of education: Not on file  . Highest education level: Bachelor's degree  (e.g., BA, AB, BS)  Occupational History  . Occupation: retired  Tobacco Use  . Smoking status: Never Smoker  . Smokeless tobacco: Never Used  Vaping Use  . Vaping Use: Never used  Substance and Sexual Activity  . Alcohol use: Yes    Alcohol/week: 3.0 standard drinks    Types: 3 Glasses of wine per week    Comment: on weekends  . Drug use: No  . Sexual activity: Not Currently  Other Topics Concern  . Not on file  Social History Narrative   Walks outside for 30 minutes 3 days a week, works outside in [place] Strain: [place]   . Difficulty of Paying Living Expenses: Not hard at all  Food Insecurity: No Food Insecurity  . Worried About Charity fundraiser in the Last Year: Never true  . Ran Out of Food in the Last Year: Never true  Transportation Needs: No Transportation Needs  . Lack of Transportation (Medical): No  . Lack of Transportation (Non-Medical): No  Physical Activity: Insufficiently Active  . Days of Exercise per Week: 3 days  . Minutes of Exercise per Session: 40 min  Stress: No Stress Concern Present  . Feeling of Stress : Not at all  Social Connections:   . Frequency of Communication with Friends and Family: Not on file  . Frequency of Social Gatherings with Friends and Family: Not on file  . Attends Religious Services: Not on file  . Active Member of Clubs or Organizations: Not on file  . Attends Archivist Meetings: Not on file  . Marital Status: Not on file    Tobacco Counseling Counseling given: Not Answered   Clinical Intake:  Pre-visit preparation completed: Yes        Nutritional Status: BMI 25 -29 Overweight Nutritional Risks: None Diabetes: No  How often do you need to have someone help you when you read instructions, pamphlets, or other written materials from your doctor or pharmacy?: 1 - Never What is the last grade level you completed in school?: bachelor's  degree  Diabetic? no  Interpreter Needed?: No  Information entered by :: NAllen LPN   Activities of Daily Living In your present state of health, do you have any difficulty performing the following activities: 08/28/2020 02/26/2020  Hearing? N N  Vision? N N  Difficulty concentrating or making decisions? N N  Walking or climbing stairs? N N  Dressing or bathing? N N  Doing errands, shopping? N N  Preparing Food and eating ? N -  Using the Toilet? N -  In the past six months, have you accidently leaked urine? N -  Do you have problems with loss of bowel control? N -  Managing your Medications? N -  Managing your Finances? N -  Housekeeping or managing your Housekeeping? N -  Some recent data might be hidden    Patient Care Team: Venita Lick, NP as PCP - General (Nurse Practitioner) Jomarie Longs  B, OD (Optometry) Corey Skains, MD as Consulting Physician (Cardiology) Samara Deist, DPM as Referring Physician (Podiatry) Vanita Ingles, RN as Case Manager (General Practice)  Indicate any recent Medical Services you may have received from other than [facility] providers in the past year (date may be approximate).     Assessment:   This is a routine wellness examination for Janet Taylor.  Hearing/Vision screen  Hearing Screening   125Hz  250Hz  500Hz  1000Hz  2000Hz  3000Hz  4000Hz  6000Hz  8000Hz   Right ear:           Left ear:           Vision Screening Comments: Regular eye exams, Dr. Leanne Lovely  Dietary issues and exercise activities discussed: Current Exercise Habits: Structured exercise class, Type of exercise: calisthenics, Time (Minutes): 45, Frequency (Times/Week): 3, Weekly Exercise (Minutes/Week): 135  Goals    . DIET - INCREASE WATER INTAKE     Recommend drinking at least 6-8 glasses of water a day     . Patient Stated     08/28/2020, wants to weigh 155 pounds      Depression Screen PHQ 2/9 Scores 08/28/2020 02/26/2020 08/08/2019 02/12/2019 08/03/2018 07/26/2017 05/12/2017   PHQ - 2 Score 0 0 0 0 0 0 0  PHQ- 9 Score - - - 2 - - -    Fall Risk Fall Risk  08/28/2020 02/26/2020 08/08/2019 02/12/2019 08/03/2018  Falls in the past year? 0 0 0 1 Yes  Number falls in past yr: - 0 - 0 1  Injury with Fall? - 0 - 1 Yes  Risk Factor Category  - - - - High Fall Risk  Risk for fall due to : Medication side effect - - History of fall(s) -  Follow up Falls evaluation completed;Education provided;Falls prevention discussed Falls evaluation completed - Falls evaluation completed Falls prevention discussed    Any stairs in or around the home? Yes  If so, are there any without handrails? No  Home free of loose throw rugs in walkways, pet beds, electrical cords, etc? Yes  Adequate lighting in your home to reduce risk of falls? Yes   ASSISTIVE DEVICES UTILIZED TO PREVENT FALLS:  Life alert? No  Use of a cane, walker or w/c? No  Grab bars in the bathroom? No  Shower chair or bench in shower? Yes  Elevated toilet seat or a handicapped toilet? No   TIMED UP AND GO:  Was the test performed? No .    Cognitive Function:     6CIT Screen 08/28/2020 08/03/2018 07/26/2017  What Year? 0 points 0 points 0 points  What month? 0 points 0 points 0 points  What time? 0 points 0 points 0 points  Count back from 20 0 points 0 points 0 points  Months in reverse 0 points 0 points 0 points  Repeat phrase 0 points 0 points 0 points  Total Score 0 0 0    Immunizations Immunization History  Administered Date(s) Administered  . Fluad Quad(high Dose 65+) 09/30/2019  . Influenza, High Dose Seasonal PF 09/17/2018  . PFIZER SARS-COV-2 Vaccination 01/06/2020, 01/27/2020  . Pneumococcal Conjugate-13 04/02/2014  . Pneumococcal Polysaccharide-23 04/13/2016  . Td 04/14/2004  . Zoster 11/17/2008    TDAP status: Due, Education has been provided regarding the importance of this vaccine. Advised may receive this vaccine at local pharmacy or Health Dept. Aware to provide a copy of the vaccination  record if obtained from local pharmacy or Health Dept. Verbalized acceptance and understanding. Flu Vaccine status:  Up to date Pneumococcal vaccine status: Up to date Covid-19 vaccine status: Completed vaccines  Qualifies for Shingles Vaccine? Yes   Zostavax completed Yes   Shingrix Completed?: No.    Education has been provided regarding the importance of this vaccine. Patient has been advised to call insurance company to determine out of pocket expense if they have not yet received this vaccine. Advised may also receive vaccine at local pharmacy or Health Dept. Verbalized acceptance and understanding.  Screening Tests Health Maintenance  Topic Date Due  . INFLUENZA VACCINE  07/12/2020  . TETANUS/TDAP  02/25/2021 (Originally 04/14/2014)  . MAMMOGRAM  08/26/2022  . COLONOSCOPY  10/02/2022  . DEXA SCAN  Completed  . COVID-19 Vaccine  Completed  . Hepatitis C Screening  Completed  . PNA vac Low Risk Adult  Completed    Health Maintenance  Health Maintenance Due  Topic Date Due  . INFLUENZA VACCINE  07/12/2020    Colorectal cancer screening: Completed 10/02/2017. Repeat every 5 years Mammogram status: Completed 08/26/2020. Repeat every year Bone Density status: Completed 06/03/2019.  Lung Cancer Screening: (Low Dose CT Chest recommended if Age 70-80 years, 30 pack-year currently smoking OR have quit w/in 15years.) does not qualify.   Lung Cancer Screening Referral: no  Additional Screening:  Hepatitis C Screening: does qualify; Completed 04/13/2016  Vision Screening: Recommended annual ophthalmology exams for early detection of glaucoma and other disorders of the eye. Is the patient up to date with their annual eye exam?  Yes  Who is the provider or what is the name of the office in which the patient attends annual eye exams? Dr. Matilde Sprang If pt is not established with a provider, would they like to be referred to a provider to establish care? No .   Dental Screening: Recommended  annual dental exams for proper oral hygiene  Community Resource Referral / Chronic Care Management: CRR required this visit?  No   CCM required this visit?  No      Plan:     I have personally reviewed and noted the following in the patient's chart:   . Medical and social history . Use of alcohol, tobacco or illicit drugs  . Current medications and supplements . Functional ability and status . Nutritional status . Physical activity . Advanced directives . List of other physicians . Hospitalizations, surgeries, and ER visits in previous 12 months . Vitals . Screenings to include cognitive, depression, and falls . Referrals and appointments  In addition, I have reviewed and discussed with patient certain preventive protocols, quality metrics, and best practice recommendations. A written personalized care plan for preventive services as well as general preventive health recommendations were provided to patient.     Kellie Simmering, LPN   [id number]   Nurse Notes:

## 2020-09-01 ENCOUNTER — Other Ambulatory Visit: Payer: Self-pay | Admitting: Nurse Practitioner

## 2020-09-01 NOTE — Telephone Encounter (Signed)
Requested Prescriptions  Pending Prescriptions Disp Refills  . hydrochlorothiazide (HYDRODIURIL) 25 MG tablet [Pharmacy Med Name: HYDROCHLOROTHIAZIDE 25 MG TAB] 90 tablet 3    Sig: TAKE 1 TABLET BY MOUTH EVERY DAY     Cardiovascular: Diuretics - Thiazide Passed - 09/01/2020  1:18 AM      Passed - Ca in normal range and within 360 days    Calcium  Date Value Ref Range Status  04/07/2020 9.4 8.7 - 10.3 mg/dL Final   Calcium, Total  Date Value Ref Range Status  03/06/2015 8.6 (L) mg/dL Final    Comment:    8.9-10.3 NOTE: New Reference Range  02/17/15          Passed - Cr in normal range and within 360 days    Creatinine  Date Value Ref Range Status  03/06/2015 0.66 mg/dL Final    Comment:    0.44-1.00 NOTE: New Reference Range  02/17/15    Creatinine, Ser  Date Value Ref Range Status  04/07/2020 0.73 0.57 - 1.00 mg/dL Final         Passed - K in normal range and within 360 days    Potassium  Date Value Ref Range Status  04/07/2020 3.8 3.5 - 5.2 mmol/L Final  03/06/2015 3.4 (L) mmol/L Final    Comment:    3.5-5.1 NOTE: New Reference Range  02/17/15          Passed - Na in normal range and within 360 days    Sodium  Date Value Ref Range Status  04/07/2020 141 134 - 144 mmol/L Final  03/06/2015 139 mmol/L Final    Comment:    135-145 NOTE: New Reference Range  02/17/15          Passed - Last BP in normal range    BP Readings from Last 1 Encounters:  04/07/20 120/80         Passed - Valid encounter within last 6 months    Recent Outpatient Visits          4 months ago Pure hypercholesterolemia   [place], Barbaraann Faster, NP   6 months ago Essential hypertension   [place], [place] T, NP   1 year ago Pure hypercholesterolemia   [place], [place] T, NP   1 year ago Encounter for annual physical exam   [place], Henrine Screws T, NP   2 years ago Essential hypertension    [place], [place], PA-C      Future Appointments            In 1 month [place], Barbaraann Faster, NP [facility]   In 21 months  [facility]

## 2020-09-09 ENCOUNTER — Ambulatory Visit
Admission: RE | Admit: 2020-09-09 | Discharge: 2020-09-09 | Disposition: A | Discharge status: Home / Self Care | Payer: Medicare Other | Source: Ambulatory Visit | Attending: Nurse Practitioner | Admitting: Nurse Practitioner

## 2020-09-09 ENCOUNTER — Other Ambulatory Visit: Payer: Self-pay

## 2020-09-09 ENCOUNTER — Other Ambulatory Visit: Payer: Self-pay | Admitting: Nurse Practitioner

## 2020-09-09 DIAGNOSIS — R922 Inconclusive mammogram: Secondary | ICD-10-CM | POA: Diagnosis not present

## 2020-09-09 DIAGNOSIS — R928 Other abnormal and inconclusive findings on diagnostic imaging of breast: Secondary | ICD-10-CM | POA: Insufficient documentation

## 2020-09-09 DIAGNOSIS — R921 Mammographic calcification found on diagnostic imaging of breast: Secondary | ICD-10-CM | POA: Diagnosis not present

## 2020-09-14 DIAGNOSIS — Z23 Encounter for immunization: Secondary | ICD-10-CM | POA: Diagnosis not present

## 2020-09-16 ENCOUNTER — Ambulatory Visit
Admission: RE | Admit: 2020-09-16 | Discharge: 2020-09-16 | Disposition: A | Discharge status: Home / Self Care | Payer: Medicare Other | Source: Ambulatory Visit | Attending: Nurse Practitioner | Admitting: Nurse Practitioner

## 2020-09-16 ENCOUNTER — Other Ambulatory Visit: Payer: Self-pay

## 2020-09-16 DIAGNOSIS — R921 Mammographic calcification found on diagnostic imaging of breast: Secondary | ICD-10-CM

## 2020-09-16 DIAGNOSIS — R928 Other abnormal and inconclusive findings on diagnostic imaging of breast: Secondary | ICD-10-CM

## 2020-09-16 HISTORY — PX: BREAST BIOPSY: SHX20

## 2020-09-17 LAB — SURGICAL PATHOLOGY

## 2020-09-18 ENCOUNTER — Other Ambulatory Visit: Payer: Self-pay | Admitting: Nurse Practitioner

## 2020-09-18 DIAGNOSIS — B000 Eczema herpeticum: Secondary | ICD-10-CM

## 2020-10-02 ENCOUNTER — Encounter: Payer: Self-pay | Admitting: Nurse Practitioner

## 2020-10-07 ENCOUNTER — Other Ambulatory Visit: Payer: Self-pay

## 2020-10-07 ENCOUNTER — Encounter: Payer: Self-pay | Admitting: Nurse Practitioner

## 2020-10-07 ENCOUNTER — Ambulatory Visit (INDEPENDENT_AMBULATORY_CARE_PROVIDER_SITE_OTHER): Payer: Medicare Other | Admitting: Nurse Practitioner

## 2020-10-07 VITALS — BP 120/85 | HR 93 | Temp 98.1°F | Resp 16 | Wt 171.0 lb

## 2020-10-07 DIAGNOSIS — M791 Myalgia, unspecified site: Secondary | ICD-10-CM

## 2020-10-07 DIAGNOSIS — E7800 Pure hypercholesterolemia, unspecified: Secondary | ICD-10-CM

## 2020-10-07 DIAGNOSIS — E559 Vitamin D deficiency, unspecified: Secondary | ICD-10-CM

## 2020-10-07 DIAGNOSIS — T466X5A Adverse effect of antihyperlipidemic and antiarteriosclerotic drugs, initial encounter: Secondary | ICD-10-CM

## 2020-10-07 DIAGNOSIS — I10 Essential (primary) hypertension: Secondary | ICD-10-CM | POA: Diagnosis not present

## 2020-10-07 NOTE — Progress Notes (Signed)
BP 120/85 (BP Location: Left Arm, Patient Position: Sitting, Cuff Size: Normal)   Pulse 93   Temp 98.1 F (36.7 C) (Oral)   Resp 16   Wt 171 lb (77.6 kg)   LMP  (LMP Unknown)   SpO2 97%   BMI 29.82 kg/m    Subjective:    Patient ID: Janet Taylor, female    DOB: 07/30/1946, 74 y.o.   MRN: [id number]  HPI: Janet Taylor is a 74 y.o. female  Chief Complaint  Patient presents with  . Hyperlipidemia  . Hypertension   HYPERTENSION / HYPERLIPIDEMIA Continues on HCTZ, Metoprolol, and Repatha.  With Repatha on board last LDL much improved at 24 in April.  History of low Vitamin D levels, last was 31.8, taking daily supplement.  Saw cardiology in August 2021 -- last echo was in 2019 EF 50-55%.   Satisfied with current treatment? yes Duration of hypertension: chronic BP monitoring frequency: a few times a week BP range: 120/80 range at home BP medication side effects: no Duration of hyperlipidemia: chronic Cholesterol medication side effects: no Cholesterol supplements: fish oil Medication compliance: good compliance Aspirin: no Recent stressors: no Recurrent headaches: no Visual changes: no Palpitations: no Dyspnea: no Chest pain: no Lower extremity edema: no Dizzy/lightheaded: no  Relevant past medical, surgical, family and social history reviewed and updated as indicated. Interim medical history since our last visit reviewed. Allergies and medications reviewed and updated.  Review of Systems  Constitutional: Negative for activity change, appetite change, diaphoresis, fatigue and fever.  Respiratory: Negative for cough, chest tightness and shortness of breath.   Cardiovascular: Negative for chest pain, palpitations and leg swelling.  Gastrointestinal: Negative.   Neurological: Negative.   Psychiatric/Behavioral: Negative.     Per HPI unless specifically indicated above     Objective:    BP 120/85 (BP Location: Left Arm, Patient Position: Sitting, Cuff Size:  Normal)   Pulse 93   Temp 98.1 F (36.7 C) (Oral)   Resp 16   Wt 171 lb (77.6 kg)   LMP  (LMP Unknown)   SpO2 97%   BMI 29.82 kg/m   Wt Readings from Last 3 Encounters:  10/07/20 171 lb (77.6 kg)  08/28/20 168 lb (76.2 kg)  04/07/20 175 lb 12.8 oz (79.7 kg)    Physical Exam Vitals and nursing note reviewed.  Constitutional:      General: She is awake. She is not in acute distress.    Appearance: She is well-developed and overweight. She is not ill-appearing.  HENT:     Head: Normocephalic.     Right Ear: Hearing normal.     Left Ear: Hearing normal.  Eyes:     General: Lids are normal.        Right eye: No discharge.        Left eye: No discharge.     Conjunctiva/sclera: Conjunctivae normal.     Pupils: Pupils are equal, round, and reactive to light.  Neck:     Thyroid: No thyromegaly.     Vascular: No carotid bruit.  Cardiovascular:     Rate and Rhythm: Normal rate and regular rhythm.     Heart sounds: Normal heart sounds. No murmur heard.  No gallop.   Pulmonary:     Effort: Pulmonary effort is normal. No accessory muscle usage or respiratory distress.     Breath sounds: Normal breath sounds.  Abdominal:     General: Bowel sounds are normal.     Palpations:  Abdomen is soft. There is no hepatomegaly or splenomegaly.  Musculoskeletal:     Cervical back: Normal range of motion and neck supple.     Right lower leg: No edema.     Left lower leg: No edema.  Skin:    General: Skin is warm and dry.  Neurological:     Mental Status: She is alert and oriented to person, place, and time.  Psychiatric:        Attention and Perception: Attention normal.        Mood and Affect: Mood normal.        Speech: Speech normal.        Behavior: Behavior normal. Behavior is cooperative.        Thought Content: Thought content normal.     Results for orders placed or performed during the hospital encounter of 09/16/20  Surgical pathology  Result Value Ref Range   SURGICAL  PATHOLOGY      SURGICAL PATHOLOGY CASE: [phone] PATIENT: Scherry Ran Surgical Pathology Report     Specimen Submitted: A. Right breast, 12:00; stereo biopsy  Clinical History: 74 year old female with calcifications in the upper central right breast and spanning 1.7 cm; X shaped biopsy marking clip deployed      DIAGNOSIS: A. BREAST WITH CALCIFICATIONS, RIGHT 12:00; STEREOTACTIC BIOPSY: - FIBROADENOMATOID CHANGE WITH ASSOCIATED COARSE CALCIFICATION. - NEGATIVE FOR ATYPIA AND MALIGNANCY.  GROSS DESCRIPTION: A. Labeled: Calcifications right breast 12:00 Received: in a formalin-filled [place] collection device Specimen radiograph image(s) available for review, calcifications in the A and I portions of the device Time/Date in fixative: 8:58 AM on 09/16/2020 Cold ischemic time: 3 minutes Total fixation time: 8 hours Core pieces: Multiple Measurement: 1.5 x 1.3 x 1.8 cm Description / comments: Aggregate of pale yellow fibrofatty tissue fragments Inked: Black Entirel y submitted in cassette(s):  1- section A 2- section I 3-section B-C 4-section D-E 5-section F-G 6-section H, J   Final Diagnosis performed by Quay Burow, MD.   Electronically signed 09/17/2020 1:35:02PM The electronic signature indicates that the named Attending Pathologist has evaluated the specimen Technical component performed at [facility], [address], [place], [place] [address] Lab: [phone] Dir: Rush Farmer, MD, MMM  Professional component performed at [facility], [facility], [place], [place], [place] [address] Lab: [phone] Dir: Dellia Nims. Rubinas, MD       Assessment & Plan:   Problem List Items Addressed This Visit      Cardiovascular and Mediastinum   Hypertension - Primary    Chronic, stable with BP at goal at home on readings and in office today.  Continue daily BP checks at home and current medication regimen + collaboration with cardiology.   DASH diet focus.  BMP and TSH today.  Return in 6 months for follow-up.      Relevant Orders   Basic metabolic panel   TSH     Other   Hyperlipidemia    Chronic, ongoing with recent addition of Repatha which she is tolerating well.  LDL today from 174 to now 59.  Praised for success and recommend continuing Repatha, will send refills as needed.  Return in 6 months. Lipid panel today.      Relevant Orders   Lipid Panel w/o Chol/HDL Ratio   Myalgia due to statin    Tolerating Repatha with improved LDL, continue this regimen and adjust as needed.      Vitamin D deficiency    Ongoing, stable.  Continue weekly daily  supplement.  Vitamin D level next visit.          Follow up plan: Return in about 6 months (around 04/07/2021) for HTN/HLD, Vitamin D.

## 2020-10-07 NOTE — Assessment & Plan Note (Addendum)
Chronic, ongoing with recent addition of Repatha which she is tolerating well.  LDL today from 174 to now 59.  Praised for success and recommend continuing Repatha, will send refills as needed.  Return in 6 months. Lipid panel today.

## 2020-10-07 NOTE — Assessment & Plan Note (Signed)
Ongoing, stable.  Continue weekly daily supplement.  Vitamin D level next visit. ?

## 2020-10-07 NOTE — Patient Instructions (Signed)
DASH Eating Plan DASH stands for "Dietary Approaches to Stop Hypertension." The DASH eating plan is a healthy eating plan that has been shown to reduce high blood pressure (hypertension). It may also reduce your risk for type 2 diabetes, heart disease, and stroke. The DASH eating plan may also help with weight loss. What are tips for following this plan?  General guidelines  Avoid eating more than 2,300 mg (milligrams) of salt (sodium) a day. If you have hypertension, you may need to reduce your sodium intake to 1,500 mg a day.  Limit alcohol intake to no more than 1 drink a day for nonpregnant women and 2 drinks a day for men. One drink equals 12 oz of beer, 5 oz of wine, or 1 oz of hard liquor.  Work with your health care provider to maintain a healthy body weight or to lose weight. Ask what an ideal weight is for you.  Get at least 30 minutes of exercise that causes your heart to beat faster (aerobic exercise) most days of the week. Activities may include walking, swimming, or biking.  Work with your health care provider or diet and nutrition specialist (dietitian) to adjust your eating plan to your individual calorie needs. Reading food labels   Check food labels for the amount of sodium per serving. Choose foods with less than 5 percent of the Daily Value of sodium. Generally, foods with less than 300 mg of sodium per serving fit into this eating plan.  To find whole grains, look for the word "whole" as the first word in the ingredient list. Shopping  Buy products labeled as "low-sodium" or "no salt added."  Buy fresh foods. Avoid canned foods and premade or frozen meals. Cooking  Avoid adding salt when cooking. Use salt-free seasonings or herbs instead of table salt or sea salt. Check with your health care provider or pharmacist before using salt substitutes.  Do not fry foods. Cook foods using healthy methods such as baking, boiling, grilling, and broiling instead.  Cook with  heart-healthy oils, such as olive, canola, soybean, or sunflower oil. Meal planning  Eat a balanced diet that includes: ? 5 or more servings of fruits and vegetables each day. At each meal, try to fill half of your plate with fruits and vegetables. ? Up to 6-8 servings of whole grains each day. ? Less than 6 oz of lean meat, poultry, or fish each day. A 3-oz serving of meat is about the same size as a deck of cards. One egg equals 1 oz. ? 2 servings of low-fat dairy each day. ? A serving of nuts, seeds, or beans 5 times each week. ? Heart-healthy fats. Healthy fats called Omega-3 fatty acids are found in foods such as flaxseeds and coldwater fish, like sardines, salmon, and mackerel.  Limit how much you eat of the following: ? Canned or prepackaged foods. ? Food that is high in trans fat, such as fried foods. ? Food that is high in saturated fat, such as fatty meat. ? Sweets, desserts, sugary drinks, and other foods with added sugar. ? Full-fat dairy products.  Do not salt foods before eating.  Try to eat at least 2 vegetarian meals each week.  Eat more home-cooked food and less restaurant, buffet, and fast food.  When eating at a restaurant, ask that your food be prepared with less salt or no salt, if possible. What foods are recommended? The items listed may not be a complete list. Talk with your dietitian about   what dietary choices are best for you. Grains Whole-grain or whole-wheat bread. Whole-grain or whole-wheat pasta. Brown rice. Oatmeal. Quinoa. Bulgur. Whole-grain and low-sodium cereals. Pita bread. Low-fat, low-sodium crackers. Whole-wheat flour tortillas. Vegetables Fresh or frozen vegetables (raw, steamed, roasted, or grilled). Low-sodium or reduced-sodium tomato and vegetable juice. Low-sodium or reduced-sodium tomato sauce and tomato paste. Low-sodium or reduced-sodium canned vegetables. Fruits All fresh, dried, or frozen fruit. Canned fruit in natural juice (without  added sugar). Meat and other protein foods Skinless chicken or turkey. Ground chicken or turkey. Pork with fat trimmed off. Fish and seafood. Egg whites. Dried beans, peas, or lentils. Unsalted nuts, nut butters, and seeds. Unsalted canned beans. Lean cuts of beef with fat trimmed off. Low-sodium, lean deli meat. Dairy Low-fat (1%) or fat-free (skim) milk. Fat-free, low-fat, or reduced-fat cheeses. Nonfat, low-sodium ricotta or cottage cheese. Low-fat or nonfat yogurt. Low-fat, low-sodium cheese. Fats and oils Soft margarine without trans fats. Vegetable oil. Low-fat, reduced-fat, or light mayonnaise and salad dressings (reduced-sodium). Canola, safflower, olive, soybean, and sunflower oils. Avocado. Seasoning and other foods Herbs. Spices. Seasoning mixes without salt. Unsalted popcorn and pretzels. Fat-free sweets. What foods are not recommended? The items listed may not be a complete list. Talk with your dietitian about what dietary choices are best for you. Grains Baked goods made with fat, such as croissants, muffins, or some breads. Dry pasta or rice meal packs. Vegetables Creamed or fried vegetables. Vegetables in a cheese sauce. Regular canned vegetables (not low-sodium or reduced-sodium). Regular canned tomato sauce and paste (not low-sodium or reduced-sodium). Regular tomato and vegetable juice (not low-sodium or reduced-sodium). Pickles. Olives. Fruits Canned fruit in a light or heavy syrup. Fried fruit. Fruit in cream or butter sauce. Meat and other protein foods Fatty cuts of meat. Ribs. Fried meat. Bacon. Sausage. Bologna and other processed lunch meats. Salami. Fatback. Hotdogs. Bratwurst. Salted nuts and seeds. Canned beans with added salt. Canned or smoked fish. Whole eggs or egg yolks. Chicken or turkey with skin. Dairy Whole or 2% milk, cream, and half-and-half. Whole or full-fat cream cheese. Whole-fat or sweetened yogurt. Full-fat cheese. Nondairy creamers. Whipped toppings.  Processed cheese and cheese spreads. Fats and oils Butter. Stick margarine. Lard. Shortening. Ghee. Bacon fat. Tropical oils, such as coconut, palm kernel, or palm oil. Seasoning and other foods Salted popcorn and pretzels. Onion salt, garlic salt, seasoned salt, table salt, and sea salt. Worcestershire sauce. Tartar sauce. Barbecue sauce. Teriyaki sauce. Soy sauce, including reduced-sodium. Steak sauce. Canned and packaged gravies. Fish sauce. Oyster sauce. Cocktail sauce. Horseradish that you find on the shelf. Ketchup. Mustard. Meat flavorings and tenderizers. Bouillon cubes. Hot sauce and Tabasco sauce. Premade or packaged marinades. Premade or packaged taco seasonings. Relishes. Regular salad dressings. Where to find more information:  National Heart, Lung, and Blood Institute: www.nhlbi.nih.gov  American Heart Association: www.heart.org Summary  The DASH eating plan is a healthy eating plan that has been shown to reduce high blood pressure (hypertension). It may also reduce your risk for type 2 diabetes, heart disease, and stroke.  With the DASH eating plan, you should limit salt (sodium) intake to 2,300 mg a day. If you have hypertension, you may need to reduce your sodium intake to 1,500 mg a day.  When on the DASH eating plan, aim to eat more fresh fruits and vegetables, whole grains, lean proteins, low-fat dairy, and heart-healthy fats.  Work with your health care provider or diet and nutrition specialist (dietitian) to adjust your eating plan to your   individual calorie needs. This information is not intended to replace advice given to you by your health care provider. Make sure you discuss any questions you have with your health care provider. Document Revised: 11/10/2017 Document Reviewed: 11/21/2016 Elsevier Patient Education  2020 Elsevier Inc.  

## 2020-10-07 NOTE — Assessment & Plan Note (Signed)
Tolerating Repatha with improved LDL, continue this regimen and adjust as needed. 

## 2020-10-07 NOTE — Assessment & Plan Note (Signed)
Chronic, stable with BP at goal at home on readings and in office today.  Continue daily BP checks at home and current medication regimen + collaboration with cardiology.  DASH diet focus.  BMP and TSH today.  Return in 6 months for follow-up.

## 2020-10-08 LAB — BASIC METABOLIC PANEL
BUN/Creatinine Ratio: 15 (ref 12–28)
BUN: 10 mg/dL (ref 8–27)
CO2: 24 mmol/L (ref 20–29)
Calcium: 9.5 mg/dL (ref 8.7–10.3)
Chloride: 104 mmol/L (ref 96–106)
Creatinine, Ser: 0.68 mg/dL (ref 0.57–1.00)
GFR calc Af Amer: 100 mL/min/{1.73_m2} (ref >59)
GFR calc non Af Amer: 87 mL/min/{1.73_m2} (ref >59)
Glucose: 92 mg/dL (ref 65–99)
Potassium: 3.8 mmol/L (ref 3.5–5.2)
Sodium: 142 mmol/L (ref 134–144)

## 2020-10-08 LAB — LIPID PANEL W/O CHOL/HDL RATIO
Cholesterol, Total: 139 mg/dL (ref 100–199)
HDL: 61 mg/dL (ref >39)
LDL Chol Calc (NIH): 61 mg/dL (ref 0–99)
Triglycerides: 87 mg/dL (ref 0–149)
VLDL Cholesterol Cal: 17 mg/dL (ref 5–40)

## 2020-10-08 LAB — TSH: TSH: 0.77 u[IU]/mL (ref 0.450–4.500)

## 2020-10-08 NOTE — Progress Notes (Signed)
Contacted via [place], your labs have returned and look amazing.  I have no concerns on these.  Look at that LDL, it is 39 with the [place] on board.  Your cholesterol levels are much improved.  Well below stroke prevention goals.  Any questions? Keep being awesome!!  Thank you for allowing me to participate in your care. Kindest regards, Yuchen Fedor

## 2020-11-25 DIAGNOSIS — H5203 Hypermetropia, bilateral: Secondary | ICD-10-CM | POA: Diagnosis not present

## 2020-11-25 DIAGNOSIS — H401231 Low-tension glaucoma, bilateral, mild stage: Secondary | ICD-10-CM | POA: Diagnosis not present

## 2020-11-25 DIAGNOSIS — Z961 Presence of intraocular lens: Secondary | ICD-10-CM | POA: Diagnosis not present

## 2020-11-25 DIAGNOSIS — H52223 Regular astigmatism, bilateral: Secondary | ICD-10-CM | POA: Diagnosis not present

## 2020-11-25 DIAGNOSIS — H524 Presbyopia: Secondary | ICD-10-CM | POA: Diagnosis not present

## 2020-11-26 ENCOUNTER — Other Ambulatory Visit: Payer: Self-pay | Admitting: Nurse Practitioner

## 2020-12-06 ENCOUNTER — Other Ambulatory Visit: Payer: Self-pay | Admitting: Nurse Practitioner

## 2020-12-06 NOTE — Telephone Encounter (Signed)
Requested Prescriptions  Pending Prescriptions Disp Refills   REPATHA 140 MG/ML SOSY [Pharmacy Med Name: REPATHA 140 MG/ML SYRINGE] 2 mL 4    Sig: INJECT 140 MG INTO THE SKIN EVERY 14 (FOURTEEN) DAYS.     Cardiovascular: PCSK9 Inhibitors Passed - 12/06/2020  8:58 AM      Passed - Total Cholesterol in normal range and within 360 days    Cholesterol, Total  Date Value Ref Range Status  10/07/2020 139 100 - 199 mg/dL Final   Cholesterol Piccolo, Waived  Date Value Ref Range Status  04/07/2020 143 <200 mg/dL Final    Comment:                            Desirable                <200                         Borderline High      200- 239                         High                     >239          Passed - LDL in normal range and within 360 days    LDL Chol Calc (NIH)  Date Value Ref Range Status  10/07/2020 61 0 - 99 mg/dL Final         Passed - HDL in normal range and within 360 days    HDL  Date Value Ref Range Status  10/07/2020 61 >39 mg/dL Final         Passed - Triglycerides in normal range and within 360 days    Triglycerides  Date Value Ref Range Status  10/07/2020 87 0 - 149 mg/dL Final   Triglycerides Piccolo,Waived  Date Value Ref Range Status  04/07/2020 91 <150 mg/dL Final    Comment:                            Normal                   <150                         Borderline High     150 - 199                         High                200 - 499                         Very High                >499          Passed - Valid encounter within last 12 months    Recent Outpatient Visits          2 months ago Primary hypertension   [facility] [place], Corrie Dandy T, NP   8 months ago Pure hypercholesterolemia   [facility] [place], [place] T, NP   9 months ago Essential hypertension   [facility] [place], [place]  T, NP   1 year ago Pure hypercholesterolemia   [place], Barbaraann Faster, NP   1 year ago  Encounter for annual physical exam   [place], Barbaraann Faster, NP      Future Appointments            In 4 months [place], Barbaraann Faster, NP [facility]   In 8 months  [facility]

## 2021-01-09 ENCOUNTER — Other Ambulatory Visit: Payer: Self-pay | Admitting: Nurse Practitioner

## 2021-01-09 NOTE — Telephone Encounter (Signed)
Requested Prescriptions  Pending Prescriptions Disp Refills  . metoprolol tartrate (LOPRESSOR) 50 MG tablet [Pharmacy Med Name: METOPROLOL TARTRATE 50 MG TAB] 90 tablet 0    Sig: TAKE 1 TABLET BY MOUTH EVERY DAY     Cardiovascular:  Beta Blockers Passed - 01/09/2021 11:58 AM      Passed - Last BP in normal range    BP Readings from Last 1 Encounters:  10/07/20 120/85         Passed - Last Heart Rate in normal range    Pulse Readings from Last 1 Encounters:  10/07/20 93         Passed - Valid encounter within last 6 months    Recent Outpatient Visits          3 months ago Primary hypertension   [place], Barbaraann Faster, NP   9 months ago Pure hypercholesterolemia   [place], Barbaraann Faster, NP   10 months ago Essential hypertension   [place], [place] T, NP   1 year ago Pure hypercholesterolemia   [place], [place] T, NP   1 year ago Encounter for annual physical exam   [place], Barbaraann Faster, NP      Future Appointments            In 2 months Cannady, Barbaraann Faster, NP [facility]   In 7 months  [facility]

## 2021-02-15 DIAGNOSIS — H401232 Low-tension glaucoma, bilateral, moderate stage: Secondary | ICD-10-CM | POA: Diagnosis not present

## 2021-02-15 DIAGNOSIS — H524 Presbyopia: Secondary | ICD-10-CM | POA: Diagnosis not present

## 2021-02-15 DIAGNOSIS — H5203 Hypermetropia, bilateral: Secondary | ICD-10-CM | POA: Diagnosis not present

## 2021-02-15 DIAGNOSIS — Z961 Presence of intraocular lens: Secondary | ICD-10-CM | POA: Diagnosis not present

## 2021-02-15 DIAGNOSIS — H52223 Regular astigmatism, bilateral: Secondary | ICD-10-CM | POA: Diagnosis not present

## 2021-03-20 DIAGNOSIS — Z23 Encounter for immunization: Secondary | ICD-10-CM | POA: Diagnosis not present

## 2021-04-02 ENCOUNTER — Encounter: Payer: Self-pay | Admitting: Nurse Practitioner

## 2021-04-02 DIAGNOSIS — H401231 Low-tension glaucoma, bilateral, mild stage: Secondary | ICD-10-CM | POA: Insufficient documentation

## 2021-04-07 ENCOUNTER — Other Ambulatory Visit: Payer: Self-pay | Admitting: Nurse Practitioner

## 2021-04-07 ENCOUNTER — Ambulatory Visit (INDEPENDENT_AMBULATORY_CARE_PROVIDER_SITE_OTHER): Payer: Medicare Other | Admitting: Nurse Practitioner

## 2021-04-07 ENCOUNTER — Other Ambulatory Visit: Payer: Self-pay

## 2021-04-07 ENCOUNTER — Encounter: Payer: Self-pay | Admitting: Nurse Practitioner

## 2021-04-07 VITALS — BP 112/77 | HR 98 | Temp 98.3°F | Wt 169.2 lb

## 2021-04-07 DIAGNOSIS — M85851 Other specified disorders of bone density and structure, right thigh: Secondary | ICD-10-CM

## 2021-04-07 DIAGNOSIS — E7800 Pure hypercholesterolemia, unspecified: Secondary | ICD-10-CM

## 2021-04-07 DIAGNOSIS — I10 Essential (primary) hypertension: Secondary | ICD-10-CM

## 2021-04-07 DIAGNOSIS — E559 Vitamin D deficiency, unspecified: Secondary | ICD-10-CM

## 2021-04-07 DIAGNOSIS — M791 Myalgia, unspecified site: Secondary | ICD-10-CM

## 2021-04-07 DIAGNOSIS — T466X5A Adverse effect of antihyperlipidemic and antiarteriosclerotic drugs, initial encounter: Secondary | ICD-10-CM

## 2021-04-07 MED ORDER — METOPROLOL TARTRATE 50 MG PO TABS
50.0000 mg | ORAL_TABLET | Freq: Every day | ORAL | 4 refills | Status: DC
Start: 2021-04-07 — End: 2022-04-05

## 2021-04-07 MED ORDER — HYDROCHLOROTHIAZIDE 25 MG PO TABS
1.0000 | ORAL_TABLET | Freq: Every day | ORAL | 4 refills | Status: DC
Start: 2021-04-07 — End: 2022-04-05

## 2021-04-07 MED ORDER — REPATHA 140 MG/ML ~~LOC~~ SOSY
140.0000 mg | PREFILLED_SYRINGE | SUBCUTANEOUS | 4 refills | Status: DC
Start: 2021-04-07 — End: 2021-06-28

## 2021-04-07 NOTE — Assessment & Plan Note (Signed)
On DEXA 2020.  Recommend continued supplement daily, Vit D and Calcium + weight bearing exercises.  Repeat DEXA in 2025.  Check Vit D level today. 

## 2021-04-07 NOTE — Assessment & Plan Note (Signed)
Tolerating Repatha with improved LDL, continue this regimen and adjust as needed. 

## 2021-04-07 NOTE — Telephone Encounter (Signed)
Requested Prescriptions  Pending Prescriptions Disp Refills  . metoprolol tartrate (LOPRESSOR) 50 MG tablet [Pharmacy Med Name: METOPROLOL TARTRATE 50 MG TAB] 90 tablet 0    Sig: TAKE 1 TABLET BY MOUTH EVERY DAY     Cardiovascular:  Beta Blockers Failed - 04/07/2021  1:20 AM      Failed - Valid encounter within last 6 months    Recent Outpatient Visits          6 months ago Primary hypertension   [place], Barbaraann Faster, NP   1 year ago Pure hypercholesterolemia   [place], [place] T, NP   1 year ago Essential hypertension   [place], [place] T, NP   1 year ago Pure hypercholesterolemia   [place], [place] T, NP   2 years ago Encounter for annual physical exam   [place], Barbaraann Faster, NP      Future Appointments            Today Venita Lick, NP [facility]   In 4 months  [facility]           Passed - Last BP in normal range    BP Readings from Last 1 Encounters:  10/07/20 120/85         Passed - Last Heart Rate in normal range    Pulse Readings from Last 1 Encounters:  10/07/20 93

## 2021-04-07 NOTE — Assessment & Plan Note (Signed)
Chronic, stable with BP at goal at home on readings and in office today.  Continue daily BP checks at home and current medication regimen + collaboration with cardiology.  DASH diet focus.  CMP and TSH today.  Return in 6 months for follow-up.

## 2021-04-07 NOTE — Assessment & Plan Note (Signed)
Chronic, ongoing with recent addition of Repatha which she is tolerating well.  LDL last visit trended down from 174 to now 61.  Praised for success and recommend continuing Repatha, will send refills as needed.  Return in 6 months. Lipid panel today.

## 2021-04-07 NOTE — Progress Notes (Signed)
BP 112/77   Pulse 98   Temp 98.3 F (36.8 C) (Oral)   Wt 169 lb 3.2 oz (76.7 kg)   LMP  (LMP Unknown)   SpO2 97%   BMI 29.50 kg/m    Subjective:    Patient ID: Janet Taylor, female    DOB: 1946-07-12, 75 y.o.   MRN: [id number]  HPI: Janet Taylor is a 75 y.o. female  Chief Complaint  Patient presents with  . Hyperlipidemia  . Hypertension  . Vitamin D    HYPERTENSION / HYPERLIPIDEMIA Continues on HCTZ, Metoprolol, and Repatha.  With Repatha on board last LDL much improved at 61, had been 174.  History of low Vitamin D levels, last was 31.8, taking daily supplement.  Saw cardiology in August 2021 -- last echo was in 2019 EF 50-55%.   Satisfied with current treatment? yes Duration of hypertension: chronic BP monitoring frequency: a few times a week BP range: 120 -130/70 range at home BP medication side effects: no Duration of hyperlipidemia: chronic Cholesterol medication side effects: no Cholesterol supplements: fish oil Medication compliance: good compliance Aspirin: no Recent stressors: no Recurrent headaches: no Visual changes: no Palpitations: no Dyspnea: no Chest pain: no Lower extremity edema: no Dizzy/lightheaded: no   OSTEOPENIA Noted on DEXA in June 2020 with T Score -1.1. Satisfied with current treatment?: yes Adequate calcium & vitamin D: yes Intolerance to bisphosphonates:no Weight bearing exercises: yes  Relevant past medical, surgical, family and social history reviewed and updated as indicated. Interim medical history since our last visit reviewed. Allergies and medications reviewed and updated.  Review of Systems  Constitutional: Negative for activity change, appetite change, diaphoresis, fatigue and fever.  Respiratory: Negative for cough, chest tightness and shortness of breath.   Cardiovascular: Negative for chest pain, palpitations and leg swelling.  Gastrointestinal: Negative.   Neurological: Negative.   Psychiatric/Behavioral:  Negative.     Per HPI unless specifically indicated above     Objective:    BP 112/77   Pulse 98   Temp 98.3 F (36.8 C) (Oral)   Wt 169 lb 3.2 oz (76.7 kg)   LMP  (LMP Unknown)   SpO2 97%   BMI 29.50 kg/m   Wt Readings from Last 3 Encounters:  04/07/21 169 lb 3.2 oz (76.7 kg)  10/07/20 171 lb (77.6 kg)  08/28/20 168 lb (76.2 kg)    Physical Exam Vitals and nursing note reviewed.  Constitutional:      General: She is awake. She is not in acute distress.    Appearance: She is well-developed and overweight. She is not ill-appearing.  HENT:     Head: Normocephalic.     Right Ear: Hearing normal.     Left Ear: Hearing normal.  Eyes:     General: Lids are normal.        Right eye: No discharge.        Left eye: No discharge.     Conjunctiva/sclera: Conjunctivae normal.     Pupils: Pupils are equal, round, and reactive to light.  Neck:     Thyroid: No thyromegaly.     Vascular: No carotid bruit.  Cardiovascular:     Rate and Rhythm: Normal rate and regular rhythm.     Heart sounds: Normal heart sounds. No murmur heard. No gallop.   Pulmonary:     Effort: Pulmonary effort is normal. No accessory muscle usage or respiratory distress.     Breath sounds: Normal breath sounds.  Abdominal:  General: Bowel sounds are normal.     Palpations: Abdomen is soft. There is no hepatomegaly or splenomegaly.  Musculoskeletal:     Cervical back: Normal range of motion and neck supple.     Right lower leg: No edema.     Left lower leg: No edema.  Skin:    General: Skin is warm and dry.  Neurological:     Mental Status: She is alert and oriented to person, place, and time.  Psychiatric:        Attention and Perception: Attention normal.        Mood and Affect: Mood normal.        Speech: Speech normal.        Behavior: Behavior normal. Behavior is cooperative.        Thought Content: Thought content normal.    Results for orders placed or performed in visit on 67/34/19   Basic metabolic panel  Result Value Ref Range   Glucose 92 65 - 99 mg/dL   BUN 10 8 - 27 mg/dL   Creatinine, Ser 0.68 0.57 - 1.00 mg/dL   GFR calc non Af Amer 87 >59 mL/min/1.73   GFR calc Af Amer 100 >59 mL/min/1.73   BUN/Creatinine Ratio 15 12 - 28   Sodium 142 134 - 144 mmol/L   Potassium 3.8 3.5 - 5.2 mmol/L   Chloride 104 96 - 106 mmol/L   CO2 24 20 - 29 mmol/L   Calcium 9.5 8.7 - 10.3 mg/dL  Lipid Panel w/o Chol/HDL Ratio  Result Value Ref Range   Cholesterol, Total 139 100 - 199 mg/dL   Triglycerides 87 0 - 149 mg/dL   HDL 61 >39 mg/dL   VLDL Cholesterol Cal 17 5 - 40 mg/dL   LDL Chol Calc (NIH) 61 0 - 99 mg/dL  TSH  Result Value Ref Range   TSH 0.770 0.450 - 4.500 uIU/mL      Assessment & Plan:   Problem List Items Addressed This Visit      Cardiovascular and Mediastinum   Hypertension - Primary    Chronic, stable with BP at goal at home on readings and in office today.  Continue daily BP checks at home and current medication regimen + collaboration with cardiology.  DASH diet focus.  CMP and TSH today.  Return in 6 months for follow-up.      Relevant Medications   hydrochlorothiazide (HYDRODIURIL) 25 MG tablet   metoprolol tartrate (LOPRESSOR) 50 MG tablet   Evolocumab (REPATHA) 140 MG/ML SOSY   Other Relevant Orders   Comprehensive metabolic panel   TSH     Musculoskeletal and Integument   Osteopenia of neck of right femur    On DEXA 2020.  Recommend continued supplement daily, Vit D and Calcium + weight bearing exercises.  Repeat DEXA in 2025.  Check Vit D level today.        Other   Hyperlipidemia    Chronic, ongoing with recent addition of Repatha which she is tolerating well.  LDL last visit trended down from 174 to now 61.  Praised for success and recommend continuing Repatha, will send refills as needed.  Return in 6 months. Lipid panel today.      Relevant Medications   hydrochlorothiazide (HYDRODIURIL) 25 MG tablet   metoprolol tartrate  (LOPRESSOR) 50 MG tablet   Evolocumab (REPATHA) 140 MG/ML SOSY   Other Relevant Orders   Lipid Panel w/o Chol/HDL Ratio   Myalgia due to statin  Tolerating Repatha with improved LDL, continue this regimen and adjust as needed.      Vitamin D deficiency    Ongoing, stable.  Continue weekly daily supplement.  Vitamin D level today.      Relevant Orders   VITAMIN D 25 Hydroxy (Vit-D Deficiency, Fractures)       Follow up plan: Return in about 6 months (around 10/07/2021) for HTN/HLD, VIT D.

## 2021-04-07 NOTE — Patient Instructions (Signed)
Preventing High Cholesterol Cholesterol is a white, waxy substance similar to fat that the human body needs to help build cells. The liver makes all the cholesterol that a person's body needs. Having high cholesterol (hypercholesterolemia) increases your risk for heart disease and stroke. Extra or excess cholesterol comes from the food that you eat. High cholesterol can often be prevented with diet and lifestyle changes. If you already have high cholesterol, you can control it with diet, lifestyle changes, and medicines. How can high cholesterol affect me? If you have high cholesterol, fatty deposits (plaques) may build up on the walls of your blood vessels. The blood vessels that carry blood away from your heart are called arteries. Plaques make the arteries narrower and stiffer. This in turn can:  Restrict or block blood flow and cause blood clots to form.  Increase your risk for heart attack and stroke. What can increase my risk for high cholesterol? This condition is more likely to develop in people who:  Eat foods that are high in saturated fat or cholesterol. Saturated fat is mostly found in foods that come from animal sources.  Are overweight.  Are not getting enough exercise.  Have a family history of high cholesterol (familial hypercholesterolemia). What actions can I take to prevent this? Nutrition  Eat less saturated fat.  Avoid trans fats (partially hydrogenated oils). These are often found in margarine and in some baked goods, fried foods, and snacks bought in packages.  Avoid precooked or cured meat, such as bacon, sausages, or meat loaves.  Avoid foods and drinks that have added sugars.  Eat more fruits, vegetables, and whole grains.  Choose healthy sources of protein, such as fish, poultry, lean cuts of red meat, beans, peas, lentils, and nuts.  Choose healthy sources of fat, such as: ? Nuts. ? Vegetable oils, especially olive oil. ? Fish that have healthy fats,  such as omega-3 fatty acids. These fish include mackerel or salmon.   Lifestyle  Lose weight if you are overweight. Maintaining a healthy body mass index (BMI) can help prevent or control high cholesterol. It can also lower your risk for diabetes and high blood pressure. Ask your health care provider to help you with a diet and exercise plan to lose weight safely.  Do not use any products that contain nicotine or tobacco, such as cigarettes, e-cigarettes, and chewing tobacco. If you need help quitting, ask your health care provider. Alcohol use  Do not drink alcohol if: ? Your health care provider tells you not to drink. ? You are pregnant, may be pregnant, or are planning to become pregnant.  If you drink alcohol: ? Limit how much you use to:  0-1 drink a day for women.  0-2 drinks a day for men. ? Be aware of how much alcohol is in your drink. In the [place] one drink equals one 12 oz bottle of beer (355 mL), one 5 oz glass of wine (148 mL), or one 1 oz glass of hard liquor (44 mL). Activity  Get enough exercise. Do exercises as told by your health care provider.  Each week, do at least 150 minutes of exercise that takes a medium level of effort (moderate-intensity exercise). This kind of exercise: ? Makes your heart beat faster while allowing you to still be able to talk. ? Can be done in short sessions several times a day or longer sessions a few times a week. For example, on 5 days each week, you could walk fast or ride   your bike 3 times a day for 10 minutes each time.   Medicines  Your health care provider may recommend medicines to help lower cholesterol. This may be a medicine to lower the amount of cholesterol that your liver makes. You may need medicine if: ? Diet and lifestyle changes have not lowered your cholesterol enough. ? You have high cholesterol and other risk factors for heart disease or stroke.  Take over-the-counter and prescription medicines only as told by your  health care provider. General information  Manage your risk factors for high cholesterol. Talk with your health care provider about all your risk factors and how to lower your risk.  Manage other conditions that you have, such as diabetes or high blood pressure (hypertension).  Have blood tests to check your cholesterol levels at regular points in time as told by your health care provider.  Keep all follow-up visits as told by your health care provider. This is important. Where to find more information  American Heart Association: www.heart.org  National Heart, Lung, and Blood Institute: www.nhlbi.nih.gov Summary  High cholesterol increases your risk for heart disease and stroke. By keeping your cholesterol level low, you can reduce your risk for these conditions.  High cholesterol can often be prevented with diet and lifestyle changes.  Work with your health care provider to manage your risk factors, and have your blood tested regularly. This information is not intended to replace advice given to you by your health care provider. Make sure you discuss any questions you have with your health care provider. Document Revised: 09/10/2019 Document Reviewed: 09/10/2019 Elsevier Patient Education  2021 Elsevier Inc.  

## 2021-04-07 NOTE — Assessment & Plan Note (Signed)
Ongoing, stable.  Continue weekly daily supplement.  Vitamin D level today.

## 2021-04-08 LAB — COMPREHENSIVE METABOLIC PANEL
ALT: 21 IU/L (ref 0–32)
AST: 19 IU/L (ref 0–40)
Albumin/Globulin Ratio: 1.7 (ref 1.2–2.2)
Albumin: 4.4 g/dL (ref 3.7–4.7)
Alkaline Phosphatase: 69 IU/L (ref 44–121)
BUN/Creatinine Ratio: 13 (ref 12–28)
BUN: 11 mg/dL (ref 8–27)
Bilirubin Total: 0.3 mg/dL (ref 0.0–1.2)
CO2: 25 mmol/L (ref 20–29)
Calcium: 9.7 mg/dL (ref 8.7–10.3)
Chloride: 97 mmol/L (ref 96–106)
Creatinine, Ser: 0.87 mg/dL (ref 0.57–1.00)
Globulin, Total: 2.6 g/dL (ref 1.5–4.5)
Glucose: 82 mg/dL (ref 65–99)
Potassium: 4 mmol/L (ref 3.5–5.2)
Sodium: 141 mmol/L (ref 134–144)
Total Protein: 7 g/dL (ref 6.0–8.5)
eGFR: 70 mL/min/{1.73_m2} (ref >59)

## 2021-04-08 LAB — LIPID PANEL W/O CHOL/HDL RATIO
Cholesterol, Total: 174 mg/dL (ref 100–199)
HDL: 68 mg/dL (ref >39)
LDL Chol Calc (NIH): 89 mg/dL (ref 0–99)
Triglycerides: 96 mg/dL (ref 0–149)
VLDL Cholesterol Cal: 17 mg/dL (ref 5–40)

## 2021-04-08 LAB — TSH: TSH: 0.709 u[IU]/mL (ref 0.450–4.500)

## 2021-04-08 LAB — VITAMIN D 25 HYDROXY (VIT D DEFICIENCY, FRACTURES): Vit D, 25-Hydroxy: 23.3 ng/mL — ABNORMAL LOW (ref 30.0–100.0)

## 2021-04-08 NOTE — Progress Notes (Signed)
Contacted via MyChart   Good morning Janet Taylor your labs have returned.  Cholesterol levels remain stable with Repatha, continue this as it has been very beneficial for you.  Kidney, liver, and electrolytes are stable.  Vitamin D level remains slightly on low side.  How much are you taking daily at this time?  Recommend increasing by 1000 units -- so if taking 1000 units daily take 2000 units daily.  We will recheck next visit.  Thyroid lab is normal.  Any questions? Keep being awesome!!  Thank you for allowing me to participate in your care. Kindest regards, Taylen Wendland

## 2021-05-18 DIAGNOSIS — H401232 Low-tension glaucoma, bilateral, moderate stage: Secondary | ICD-10-CM | POA: Diagnosis not present

## 2021-05-18 DIAGNOSIS — Z961 Presence of intraocular lens: Secondary | ICD-10-CM | POA: Diagnosis not present

## 2021-05-18 DIAGNOSIS — H5203 Hypermetropia, bilateral: Secondary | ICD-10-CM | POA: Diagnosis not present

## 2021-05-18 DIAGNOSIS — H52223 Regular astigmatism, bilateral: Secondary | ICD-10-CM | POA: Diagnosis not present

## 2021-05-18 DIAGNOSIS — H524 Presbyopia: Secondary | ICD-10-CM | POA: Diagnosis not present

## 2021-06-28 ENCOUNTER — Other Ambulatory Visit: Payer: Self-pay | Admitting: Nurse Practitioner

## 2021-06-28 NOTE — Telephone Encounter (Signed)
Future visit in 2 months  

## 2021-07-06 ENCOUNTER — Telehealth: Payer: Self-pay

## 2021-07-06 ENCOUNTER — Other Ambulatory Visit: Payer: Self-pay | Admitting: Nurse Practitioner

## 2021-07-06 DIAGNOSIS — B000 Eczema herpeticum: Secondary | ICD-10-CM

## 2021-07-06 MED ORDER — HYDROCORTISONE 2.5 % EX CREA: TOPICAL_CREAM | CUTANEOUS | 4 refills | Status: DC

## 2021-07-06 NOTE — Telephone Encounter (Signed)
Copied from [place] [id number]. Topic: General - Inquiry >> Jul 05, 2021  4:12 PM Loma [place] wrote: Reason for CRM: hydrocortisone 2.5 % cream 28 g 0 09/18/2020   Sig: APPLY TO AFFECTED AREA TWICE A DAY Pt is requesting to have a printed script as often the pharmacy does not have the specific brand she wants and she had rather go to the pharmacies to ensure she gets what she wants. FU at [phone]. Pt states she wants to come back tomorrow aftn to PU

## 2021-07-22 DIAGNOSIS — I10 Essential (primary) hypertension: Secondary | ICD-10-CM | POA: Diagnosis not present

## 2021-07-22 DIAGNOSIS — R9431 Abnormal electrocardiogram [ECG] [EKG]: Secondary | ICD-10-CM | POA: Diagnosis not present

## 2021-07-22 DIAGNOSIS — E782 Mixed hyperlipidemia: Secondary | ICD-10-CM | POA: Diagnosis not present

## 2021-08-10 ENCOUNTER — Other Ambulatory Visit: Payer: Self-pay | Admitting: Nurse Practitioner

## 2021-08-10 DIAGNOSIS — Z1231 Encounter for screening mammogram for malignant neoplasm of breast: Secondary | ICD-10-CM

## 2021-08-19 DIAGNOSIS — R9431 Abnormal electrocardiogram [ECG] [EKG]: Secondary | ICD-10-CM | POA: Diagnosis not present

## 2021-08-20 DIAGNOSIS — I340 Nonrheumatic mitral (valve) insufficiency: Secondary | ICD-10-CM | POA: Diagnosis not present

## 2021-08-20 DIAGNOSIS — E782 Mixed hyperlipidemia: Secondary | ICD-10-CM | POA: Diagnosis not present

## 2021-08-20 DIAGNOSIS — I10 Essential (primary) hypertension: Secondary | ICD-10-CM | POA: Diagnosis not present

## 2021-08-25 DIAGNOSIS — H401232 Low-tension glaucoma, bilateral, moderate stage: Secondary | ICD-10-CM | POA: Diagnosis not present

## 2021-08-25 DIAGNOSIS — H5203 Hypermetropia, bilateral: Secondary | ICD-10-CM | POA: Diagnosis not present

## 2021-08-25 DIAGNOSIS — Z961 Presence of intraocular lens: Secondary | ICD-10-CM | POA: Diagnosis not present

## 2021-08-25 DIAGNOSIS — H524 Presbyopia: Secondary | ICD-10-CM | POA: Diagnosis not present

## 2021-08-25 DIAGNOSIS — H52223 Regular astigmatism, bilateral: Secondary | ICD-10-CM | POA: Diagnosis not present

## 2021-08-27 ENCOUNTER — Other Ambulatory Visit: Payer: Self-pay

## 2021-08-27 ENCOUNTER — Ambulatory Visit
Admission: RE | Admit: 2021-08-27 | Discharge: 2021-08-27 | Disposition: A | Discharge status: Home / Self Care | Payer: Medicare Other | Source: Ambulatory Visit | Attending: Nurse Practitioner | Admitting: Nurse Practitioner

## 2021-08-27 DIAGNOSIS — Z1231 Encounter for screening mammogram for malignant neoplasm of breast: Secondary | ICD-10-CM | POA: Insufficient documentation

## 2021-08-28 DIAGNOSIS — Z23 Encounter for immunization: Secondary | ICD-10-CM | POA: Diagnosis not present

## 2021-08-30 ENCOUNTER — Ambulatory Visit (INDEPENDENT_AMBULATORY_CARE_PROVIDER_SITE_OTHER): Payer: Medicare Other

## 2021-08-30 VITALS — Ht 63.5 in | Wt 168.0 lb

## 2021-08-30 DIAGNOSIS — Z0000 Encounter for general adult medical examination without abnormal findings: Secondary | ICD-10-CM

## 2021-08-30 NOTE — Patient Instructions (Signed)
Janet Taylor , Thank you for taking time to come for your Medicare Wellness Visit. I appreciate your ongoing commitment to your health goals. Please review the following plan we discussed and let me know if I can assist you in the future.   Screening recommendations/referrals: Colonoscopy: not required Mammogram: completed 08/27/2021 Bone Density: completed 06/03/2019 Recommended yearly ophthalmology/optometry visit for glaucoma screening and checkup Recommended yearly dental visit for hygiene and checkup  Vaccinations: Influenza vaccine: decline Pneumococcal vaccine: completed 04/13/2016 Tdap vaccine: due Shingles vaccine: discussed   Covid-19: 08/28/2021, 03/20/2021, 09/14/2020, 01/27/2020, 01/06/2020  Advanced directives: Please bring a copy of your POA (Power of Attorney) and/or Living Will to your next appointment.   Conditions/risks identified: none  Next appointment: Follow up in one year for your annual wellness visit    Preventive Care 65 Years and Older, Female Preventive care refers to lifestyle choices and visits with your health care provider that can promote health and wellness. What does preventive care include? A yearly physical exam. This is also called an annual well check. Dental exams once or twice a year. Routine eye exams. Ask your health care provider how often you should have your eyes checked. Personal lifestyle choices, including: Daily care of your teeth and gums. Regular physical activity. Eating a healthy diet. Avoiding tobacco and drug use. Limiting alcohol use. Practicing safe sex. Taking low-dose aspirin every day. Taking vitamin and mineral supplements as recommended by your health care provider. What happens during an annual well check? The services and screenings done by your health care provider during your annual well check will depend on your age, overall health, lifestyle risk factors, and family history of disease. Counseling  Your health care  provider may ask you questions about your: Alcohol use. Tobacco use. Drug use. Emotional well-being. Home and relationship well-being. Sexual activity. Eating habits. History of falls. Memory and ability to understand (cognition). Work and work Statistician. Reproductive health. Screening  You may have the following tests or measurements: Height, weight, and BMI. Blood pressure. Lipid and cholesterol levels. These may be checked every 5 years, or more frequently if you are over 80 years old. Skin check. Lung cancer screening. You may have this screening every year starting at age 21 if you have a 30-pack-year history of smoking and currently smoke or have quit within the past 15 years. Fecal occult blood test (FOBT) of the stool. You may have this test every year starting at age 4. Flexible sigmoidoscopy or colonoscopy. You may have a sigmoidoscopy every 5 years or a colonoscopy every 10 years starting at age 32. Hepatitis C blood test. Hepatitis B blood test. Sexually transmitted disease (STD) testing. Diabetes screening. This is done by checking your blood sugar (glucose) after you have not eaten for a while (fasting). You may have this done every 1-3 years. Bone density scan. This is done to screen for osteoporosis. You may have this done starting at age 48. Mammogram. This may be done every 1-2 years. Talk to your health care provider about how often you should have regular mammograms. Talk with your health care provider about your test results, treatment options, and if necessary, the need for more tests. Vaccines  Your health care provider may recommend certain vaccines, such as: Influenza vaccine. This is recommended every year. Tetanus, diphtheria, and acellular pertussis (Tdap, Td) vaccine. You may need a Td booster every 10 years. Zoster vaccine. You may need this after age 62. Pneumococcal 13-valent conjugate (PCV13) vaccine. One dose is recommended  after age  61. Pneumococcal polysaccharide (PPSV23) vaccine. One dose is recommended after age 62. Talk to your health care provider about which screenings and vaccines you need and how often you need them. This information is not intended to replace advice given to you by your health care provider. Make sure you discuss any questions you have with your health care provider. Document Released: 12/25/2015 Document Revised: 08/17/2016 Document Reviewed: 09/29/2015 Elsevier Interactive Patient Education  2017 [place] Prevention in the Home Falls can cause injuries. They can happen to people of all ages. There are many things you can do to make your home safe and to help prevent falls. What can I do on the outside of my home? Regularly fix the edges of walkways and driveways and fix any cracks. Remove anything that might make you trip as you walk through a door, such as a raised step or threshold. Trim any bushes or trees on the path to your home. Use bright outdoor lighting. Clear any walking paths of anything that might make someone trip, such as rocks or tools. Regularly check to see if handrails are loose or broken. Make sure that both sides of any steps have handrails. Any raised decks and porches should have guardrails on the edges. Have any leaves, snow, or ice cleared regularly. Use sand or salt on walking paths during winter. Clean up any spills in your garage right away. This includes oil or grease spills. What can I do in the bathroom? Use night lights. Install grab bars by the toilet and in the tub and shower. Do not use towel bars as grab bars. Use non-skid mats or decals in the tub or shower. If you need to sit down in the shower, use a plastic, non-slip stool. Keep the floor dry. Clean up any water that spills on the floor as soon as it happens. Remove soap buildup in the tub or shower regularly. Attach bath mats securely with double-sided non-slip rug tape. Do not have throw  rugs and other things on the floor that can make you trip. What can I do in the bedroom? Use night lights. Make sure that you have a light by your bed that is easy to reach. Do not use any sheets or blankets that are too big for your bed. They should not hang down onto the floor. Have a firm chair that has side arms. You can use this for support while you get dressed. Do not have throw rugs and other things on the floor that can make you trip. What can I do in the kitchen? Clean up any spills right away. Avoid walking on wet floors. Keep items that you use a lot in easy-to-reach places. If you need to reach something above you, use a strong step stool that has a grab bar. Keep electrical cords out of the way. Do not use floor polish or wax that makes floors slippery. If you must use wax, use non-skid floor wax. Do not have throw rugs and other things on the floor that can make you trip. What can I do with my stairs? Do not leave any items on the stairs. Make sure that there are handrails on both sides of the stairs and use them. Fix handrails that are broken or loose. Make sure that handrails are as long as the stairways. Check any carpeting to make sure that it is firmly attached to the stairs. Fix any carpet that is loose or worn. Avoid having throw  rugs at the top or bottom of the stairs. If you do have throw rugs, attach them to the floor with carpet tape. Make sure that you have a light switch at the top of the stairs and the bottom of the stairs. If you do not have them, ask someone to add them for you. What else can I do to help prevent falls? Wear shoes that: Do not have high heels. Have rubber bottoms. Are comfortable and fit you well. Are closed at the toe. Do not wear sandals. If you use a stepladder: Make sure that it is fully opened. Do not climb a closed stepladder. Make sure that both sides of the stepladder are locked into place. Ask someone to hold it for you, if  possible. Clearly mark and make sure that you can see: Any grab bars or handrails. First and last steps. Where the edge of each step is. Use tools that help you move around (mobility aids) if they are needed. These include: Canes. Walkers. Scooters. Crutches. Turn on the lights when you go into a dark area. Replace any light bulbs as soon as they burn out. Set up your furniture so you have a clear path. Avoid moving your furniture around. If any of your floors are uneven, fix them. If there are any pets around you, be aware of where they are. Review your medicines with your doctor. Some medicines can make you feel dizzy. This can increase your chance of falling. Ask your doctor what other things that you can do to help prevent falls. This information is not intended to replace advice given to you by your health care provider. Make sure you discuss any questions you have with your health care provider. Document Released: 09/24/2009 Document Revised: 05/05/2016 Document Reviewed: 01/02/2015 Elsevier Interactive Patient Education  2017 [employer].

## 2021-08-30 NOTE — Progress Notes (Signed)
I connected with Janet Taylor today by telephone and verified that I am speaking with the correct person using two identifiers. Location patient: home Location provider: work Persons participating in the virtual visit: Diane Shaunee, [place] LPN.   I discussed the limitations, risks, security and privacy concerns of performing an evaluation and management service by telephone and the availability of in person appointments. I also discussed with the patient that there may be a patient responsible charge related to this service. The patient expressed understanding and verbally consented to this telephonic visit.    Interactive audio and video telecommunications were attempted between this provider and patient, however failed, due to patient having technical difficulties OR patient did not have access to video capability.  We continued and completed visit with audio only.     Vital signs may be patient reported or missing.  Subjective:   Janet Taylor is a 75 y.o. female who presents for Medicare Annual (Subsequent) preventive examination.  Review of Systems     Cardiac Risk Factors include: advanced age (>47men, >60 women);dyslipidemia;hypertension     Objective:    Today's Vitals   08/30/21 0941  Weight: 168 lb (76.2 kg)  Height: 5' 3.5" (1.613 m)   Body mass index is 29.29 kg/m.  Advanced Directives 08/30/2021 08/28/2020 08/08/2019 08/03/2018 06/11/2018 10/02/2017 07/26/2017  Does Patient Have a Medical Advance Directive? Yes Yes No Yes No Yes Yes  Type of Paramedic of [place];Living will [place];Living will - [place];Living will - [place];Living will [place];Living will  Does patient want to make changes to medical advance directive? - - - - - - -  Copy of [place] in Chart? No - copy requested No - copy requested - No - copy requested - No - copy  requested No - copy requested  Would patient like information on creating a medical advance directive? - - - - No - Patient declined - -    Current Medications (verified) Outpatient Encounter Medications as of 08/30/2021  Medication Sig   cholecalciferol (VITAMIN D3) 25 MCG (1000 UNIT) tablet Take 1,000 Units by mouth daily.   Cod Liver Oil 1000 MG CAPS Take 1,000 capsules by mouth daily.   hydrochlorothiazide (HYDRODIURIL) 25 MG tablet Take 1 tablet (25 mg total) by mouth daily.   hydrocortisone 2.5 % cream APPLY TO AFFECTED AREA TWICE A DAY   latanoprost (XALATAN) 0.005 % ophthalmic solution 1 drop at bedtime.   metoprolol tartrate (LOPRESSOR) 50 MG tablet Take 1 tablet (50 mg total) by mouth daily.   REPATHA 140 MG/ML SOSY INJECT 140 MG INTO THE SKIN EVERY 14 (FOURTEEN) DAYS.   No facility-administered encounter medications on file as of 08/30/2021.    Allergies (verified) Hydrocodone, Lipitor [atorvastatin], Morphine and related, Rosuvastatin, and Zetia [ezetimibe]   History: Past Medical History:  Diagnosis Date   Eczema herpeticum    Hyperlipidemia    Hypertension    Menopausal disorder    Osteopenia    Past Surgical History:  Procedure Laterality Date   ABDOMINAL HYSTERECTOMY     complete   BREAST BIOPSY Right 2012   NEG   BREAST BIOPSY Right 09/16/2020   stereo bx of calcs, x marker, path pending   CATARACT EXTRACTION Right 10/03/2016   cataract surgery Left    09/12/16   COLONOSCOPY WITH PROPOFOL N/A 10/02/2017   Procedure: COLONOSCOPY WITH PROPOFOL;  Surgeon: Jonathon Bellows, MD;  Location:  [facility] ENDOSCOPY;  Service: Gastroenterology;  Laterality: N/A;   EYE SURGERY Bilateral 09/12/2016   bilateral cataract surgery    EYE SURGERY  10/03/2016   Family History  Problem Relation Age of Onset   Hypertension Father    Hypertension Sister    Breast cancer Neg Hx    Social History   Socioeconomic History   Marital status: Divorced    Spouse name: Not on file    Number of children: Not on file   Years of education: Not on file   Highest education level: Bachelor's degree (e.g., BA, AB, BS)  Occupational History   Occupation: retired  Tobacco Use   Smoking status: Never   Smokeless tobacco: Never  Vaping Use   Vaping Use: Never used  Substance and Sexual Activity   Alcohol use: Yes    Alcohol/week: 3.0 standard drinks    Types: 3 Glasses of wine per week    Comment: on weekends   Drug use: No   Sexual activity: Not Currently  Other Topics Concern   Not on file  Social History Narrative   Walks outside for 30 minutes 3 days a week, works outside in yard   Social Determinants of Radio broadcast assistant Strain: Low Risk    Difficulty of Paying Living Expenses: Not hard at all  Food Insecurity: No Food Insecurity   Worried About Charity fundraiser in the Last Year: Never true   Arboriculturist in the Last Year: Never true  Transportation Needs: No Transportation Needs   Lack of Transportation (Medical): No   Lack of Transportation (Non-Medical): No  Physical Activity: Insufficiently Active   Days of Exercise per Week: 3 days   Minutes of Exercise per Session: 40 min  Stress: No Stress Concern Present   Feeling of Stress : Not at all  Social Connections: Not on file    Tobacco Counseling Counseling given: Not Answered   Clinical Intake:  Pre-visit preparation completed: Yes  Pain : No/denies pain     Nutritional Status: BMI 25 -29 Overweight Nutritional Risks: None Diabetes: No  How often do you need to have someone help you when you read instructions, pamphlets, or other written materials from your doctor or pharmacy?: 1 - Never What is the last grade level you completed in school?: bachelor's degree  Diabetic? no  Interpreter Needed?: No  Information entered by :: NAllen LPN   Activities of Daily Living In your present state of health, do you have any difficulty performing the following activities:  08/30/2021  Hearing? N  Vision? N  Difficulty concentrating or making decisions? N  Walking or climbing stairs? N  Dressing or bathing? N  Doing errands, shopping? N  Preparing Food and eating ? N  Using the Toilet? N  In the past six months, have you accidently leaked urine? N  Do you have problems with loss of bowel control? N  Managing your Medications? N  Managing your Finances? N  Housekeeping or managing your Housekeeping? N  Some recent data might be hidden    Patient Care Team: Venita Lick, NP as PCP - General (Nurse Practitioner) Idelle Leech, OD (Optometry) Corey Skains, MD as Consulting Physician (Cardiology) Samara Deist, DPM as Referring Physician (Podiatry) Vanita Ingles, RN as Case Manager (General Practice)  Indicate any recent Medical Services you may have received from other than [facility] providers in the past year (date may be approximate).     Assessment:  This is a routine wellness examination for Janet Taylor.  Hearing/Vision screen Vision Screening - Comments:: Regular eye exams, Dr. Matilde Sprang  Dietary issues and exercise activities discussed: Current Exercise Habits: Structured exercise class, Type of exercise: calisthenics, Time (Minutes): 45, Frequency (Times/Week): 3, Weekly Exercise (Minutes/Week): 135   Goals Addressed             This Visit's Progress    Patient Stated       08/30/2021, wants to weigh 160 pounds       Depression Screen PHQ 2/9 Scores 08/30/2021 08/28/2020 02/26/2020 08/08/2019 02/12/2019 08/03/2018 07/26/2017  PHQ - 2 Score 0 0 0 0 0 0 0  PHQ- 9 Score - - - - 2 - -    Fall Risk Fall Risk  08/30/2021 08/28/2020 02/26/2020 08/08/2019 02/12/2019  Falls in the past year? 0 0 0 0 1  Number falls in past yr: - - 0 - 0  Injury with Fall? - - 0 - 1  Risk Factor Category  - - - - -  Risk for fall due to : Medication side effect Medication side effect - - History of fall(s)  Follow up Falls evaluation completed;Education  provided;Falls prevention discussed Falls evaluation completed;Education provided;Falls prevention discussed Falls evaluation completed - Falls evaluation completed    FALL RISK PREVENTION PERTAINING TO THE HOME:  Any stairs in or around the home? Yes  If so, are there any without handrails? No  Home free of loose throw rugs in walkways, pet beds, electrical cords, etc? Yes  Adequate lighting in your home to reduce risk of falls? Yes   ASSISTIVE DEVICES UTILIZED TO PREVENT FALLS:  Life alert? No  Use of a cane, walker or w/c? No  Grab bars in the bathroom? Yes  Shower chair or bench in shower? Yes  Elevated toilet seat or a handicapped toilet? No   TIMED UP AND GO:  Was the test performed? No .      Cognitive Function:     6CIT Screen 08/30/2021 08/28/2020 08/03/2018 07/26/2017  What Year? 0 points 0 points 0 points 0 points  What month? 0 points 0 points 0 points 0 points  What time? 0 points 0 points 0 points 0 points  Count back from 20 0 points 0 points 0 points 0 points  Months in reverse 0 points 0 points 0 points 0 points  Repeat phrase 0 points 0 points 0 points 0 points  Total Score 0 0 0 0    Immunizations Immunization History  Administered Date(s) Administered   Fluad Quad(high Dose 65+) 09/30/2019   Influenza, High Dose Seasonal PF 09/17/2018   PFIZER Comirnaty(Gray Top)Covid-19 Tri-Sucrose Vaccine 03/20/2021   PFIZER(Purple Top)SARS-COV-2 Vaccination 01/06/2020, 01/27/2020, 09/14/2020   Pneumococcal Conjugate-13 04/02/2014   Pneumococcal Polysaccharide-23 04/13/2016   Td 04/14/2004   Zoster, Live 11/17/2008    TDAP status: Due, Education has been provided regarding the importance of this vaccine. Advised may receive this vaccine at local pharmacy or Health Dept. Aware to provide a copy of the vaccination record if obtained from local pharmacy or Health Dept. Verbalized acceptance and understanding.  Flu Vaccine status: Declined, Education has been  provided regarding the importance of this vaccine but patient still declined. Advised may receive this vaccine at local pharmacy or Health Dept. Aware to provide a copy of the vaccination record if obtained from local pharmacy or Health Dept. Verbalized acceptance and understanding.  Pneumococcal vaccine status: Up to date  Covid-19 vaccine status: Completed vaccines  Qualifies  for Shingles Vaccine? Yes   Zostavax completed Yes   Shingrix Completed?: No.    Education has been provided regarding the importance of this vaccine. Patient has been advised to call insurance company to determine out of pocket expense if they have not yet received this vaccine. Advised may also receive vaccine at local pharmacy or Health Dept. Verbalized acceptance and understanding.  Screening Tests Health Maintenance  Topic Date Due   Zoster Vaccines- Shingrix (1 of 2) Never done   INFLUENZA VACCINE  03/11/2022 (Originally 07/12/2021)   TETANUS/TDAP  04/07/2022 (Originally 04/14/2014)   MAMMOGRAM  09/09/2022   COLONOSCOPY (Pts 45-38yrs Insurance coverage will need to be confirmed)  10/02/2022   DEXA SCAN  Completed   COVID-19 Vaccine  Completed   Hepatitis C Screening  Completed   HPV VACCINES  Aged Out    Health Maintenance  Health Maintenance Due  Topic Date Due   Zoster Vaccines- Shingrix (1 of 2) Never done    Colorectal cancer screening: No longer required.   Mammogram status: Completed 08/27/2021. Repeat every year  Bone Density status: Completed 06/03/2019.   Lung Cancer Screening: (Low Dose CT Chest recommended if Age 74-80 years, 30 pack-year currently smoking OR have quit w/in 15years.) does not qualify.   Lung Cancer Screening Referral: no  Additional Screening:  Hepatitis C Screening: does qualify; Completed 04/13/2016  Vision Screening: Recommended annual ophthalmology exams for early detection of glaucoma and other disorders of the eye. Is the patient up to date with their annual eye  exam?  Yes  Who is the provider or what is the name of the office in which the patient attends annual eye exams? Dr. Matilde Sprang If pt is not established with a provider, would they like to be referred to a provider to establish care? No .   Dental Screening: Recommended annual dental exams for proper oral hygiene  Community Resource Referral / Chronic Care Management: CRR required this visit?  No   CCM required this visit?  No      Plan:     I have personally reviewed and noted the following in the patient's chart:   Medical and social history Use of alcohol, tobacco or illicit drugs  Current medications and supplements including opioid prescriptions.  Functional ability and status Nutritional status Physical activity Advanced directives List of other physicians Hospitalizations, surgeries, and ER visits in previous 12 months Vitals Screenings to include cognitive, depression, and falls Referrals and appointments  In addition, I have reviewed and discussed with patient certain preventive protocols, quality metrics, and best practice recommendations. A written personalized care plan for preventive services as well as general preventive health recommendations were provided to patient.     Kellie Simmering, LPN   [id number]   Nurse Notes:

## 2021-09-01 NOTE — Progress Notes (Signed)
Contacted via MyChart   Normal mammogram, repeat in one year:)

## 2021-10-05 ENCOUNTER — Other Ambulatory Visit: Payer: Self-pay

## 2021-10-05 ENCOUNTER — Ambulatory Visit (INDEPENDENT_AMBULATORY_CARE_PROVIDER_SITE_OTHER): Payer: Medicare Other | Admitting: Nurse Practitioner

## 2021-10-05 ENCOUNTER — Encounter: Payer: Self-pay | Admitting: Nurse Practitioner

## 2021-10-05 VITALS — BP 102/70 | HR 76 | Temp 97.9°F | Wt 169.2 lb

## 2021-10-05 DIAGNOSIS — M85851 Other specified disorders of bone density and structure, right thigh: Secondary | ICD-10-CM

## 2021-10-05 DIAGNOSIS — I10 Essential (primary) hypertension: Secondary | ICD-10-CM | POA: Diagnosis not present

## 2021-10-05 DIAGNOSIS — E7800 Pure hypercholesterolemia, unspecified: Secondary | ICD-10-CM

## 2021-10-05 DIAGNOSIS — E559 Vitamin D deficiency, unspecified: Secondary | ICD-10-CM

## 2021-10-05 NOTE — Patient Instructions (Signed)

## 2021-10-05 NOTE — Assessment & Plan Note (Signed)
Level on 04/07/2021, was 23.3 patient taking vitamin D supplement, continue medication regimen, repeat vitamin D level today

## 2021-10-05 NOTE — Assessment & Plan Note (Signed)
Patient taking vitamin D supplement, repeat level today. Patient engaging in daily exercises

## 2021-10-05 NOTE — Assessment & Plan Note (Signed)
Chronic ongoing, continue medication regimen, lipid panel today

## 2021-10-05 NOTE — Assessment & Plan Note (Signed)
Chronic ongoing, BP in office today 102/70 mmHg, patient checks BP daily at home runs around 132/74 mmHg. Patient does daily exercises and tries to eat a healthy diet. Continue medication regimen, follow up in 6 months

## 2021-10-05 NOTE — Progress Notes (Signed)
BP 102/70   Pulse 76   Temp 97.9 F (36.6 C) (Oral)   Wt 169 lb 3.2 oz (76.7 kg)   LMP  (LMP Unknown)   SpO2 97%   BMI 29.50 kg/m    Subjective:    Patient ID: Janet Taylor, female    DOB: Jan 05, 1946, 75 y.o.   MRN: [id number]  Chief Complaint  Patient presents with   Hyperlipidemia   Hypertension   Vitamin D    NOTE WRITTEN BY [employer] DNP STUDENT.  ASSESSMENT AND PLAN OF CARE REVIEWED WITH STUDENT, AGREE WITH ABOVE FINDINGS AND PLAN.   HPI: ASHELEY Taylor is a 75 y.o. female here for a 6 month follow up, no issues or concerns today. Patient states she is doing good.  HYPERTENSION / HYPERLIPIDEMIA Patient checks blood pressure at home daily, usually runs around 132/74 mmHg Satisfied with current treatment? yes Duration of hypertension: chronic BP monitoring frequency: daily BP range:  BP medication side effects: no Past BP meds: HCTZ, metoprolol Duration of hyperlipidemia: chronic Cholesterol medication side effects: no Cholesterol supplements: cod liver oil Past cholesterol medications: repatha Medication compliance: good compliance Aspirin: no Recent stressors: no Recurrent headaches: no Visual changes: no Palpitations: no Dyspnea: no Chest pain: no Lower extremity edema: no Dizzy/lightheaded: no   Relevant past medical, surgical, family and social history reviewed and updated as indicated. Interim medical history since our last visit reviewed. Allergies and medications reviewed and updated.  Review of Systems  Constitutional:  Negative for chills, fatigue and fever.  Respiratory:  Negative for shortness of breath.   Cardiovascular:  Negative for chest pain and palpitations.  Gastrointestinal:  Negative for abdominal distention, constipation, diarrhea, nausea and vomiting.  Endocrine: Negative for cold intolerance, heat intolerance, polydipsia, polyphagia and polyuria.  Neurological:  Negative for dizziness, tremors, weakness and numbness.   Per HPI unless  specifically indicated above     Objective:    BP 102/70   Pulse 76   Temp 97.9 F (36.6 C) (Oral)   Wt 169 lb 3.2 oz (76.7 kg)   LMP  (LMP Unknown)   SpO2 97%   BMI 29.50 kg/m   Wt Readings from Last 3 Encounters:  10/05/21 169 lb 3.2 oz (76.7 kg)  08/30/21 168 lb (76.2 kg)  04/07/21 169 lb 3.2 oz (76.7 kg)    Physical Exam Vitals and nursing note reviewed.  Constitutional:      Appearance: Normal appearance. She is not ill-appearing, toxic-appearing or diaphoretic.  HENT:     Head: Normocephalic.  Eyes:     General: Lids are normal.  Neck:     Thyroid: No thyroid mass, thyromegaly or thyroid tenderness.     Vascular: No carotid bruit.     Trachea: Trachea normal.  Cardiovascular:     Rate and Rhythm: Normal rate and regular rhythm.     Heart sounds: Normal heart sounds, S1 normal and S2 normal.  Pulmonary:     Effort: Pulmonary effort is normal.     Breath sounds: Normal breath sounds and air entry.  Abdominal:     General: Bowel sounds are normal. There is no distension.     Palpations: Abdomen is soft.     Tenderness: There is no abdominal tenderness.  Lymphadenopathy:     Cervical: No cervical adenopathy.     Right cervical: No superficial, deep or posterior cervical adenopathy.    Left cervical: No superficial, deep or posterior cervical adenopathy.  Skin:    General: Skin  is warm.  Neurological:     Mental Status: She is alert.  Psychiatric:        Behavior: Behavior is cooperative.    Results for orders placed or performed in visit on 04/07/21  Comprehensive metabolic panel  Result Value Ref Range   Glucose 82 65 - 99 mg/dL   BUN 11 8 - 27 mg/dL   Creatinine, Ser 0.87 0.57 - 1.00 mg/dL   eGFR 70 >59 mL/min/1.73   BUN/Creatinine Ratio 13 12 - 28   Sodium 141 134 - 144 mmol/L   Potassium 4.0 3.5 - 5.2 mmol/L   Chloride 97 96 - 106 mmol/L   CO2 25 20 - 29 mmol/L   Calcium 9.7 8.7 - 10.3 mg/dL   Total Protein 7.0 6.0 - 8.5 g/dL   Albumin 4.4 3.7  - 4.7 g/dL   Globulin, Total 2.6 1.5 - 4.5 g/dL   Albumin/Globulin Ratio 1.7 1.2 - 2.2   Bilirubin Total 0.3 0.0 - 1.2 mg/dL   Alkaline Phosphatase 69 44 - 121 IU/L   AST 19 0 - 40 IU/L   ALT 21 0 - 32 IU/L  Lipid Panel w/o Chol/HDL Ratio  Result Value Ref Range   Cholesterol, Total 174 100 - 199 mg/dL   Triglycerides 96 0 - 149 mg/dL   HDL 68 >39 mg/dL   VLDL Cholesterol Cal 17 5 - 40 mg/dL   LDL Chol Calc (NIH) 89 0 - 99 mg/dL  VITAMIN D 25 Hydroxy (Vit-D Deficiency, Fractures)  Result Value Ref Range   Vit D, 25-Hydroxy 23.3 (L) 30.0 - 100.0 ng/mL  TSH  Result Value Ref Range   TSH 0.709 0.450 - 4.500 uIU/mL      Assessment & Plan:   Problem List Items Addressed This Visit       Cardiovascular and Mediastinum   Hypertension - Primary    Chronic ongoing, BP in office today 102/70 mmHg, patient checks BP daily at home runs around 132/74 mmHg. Patient does daily exercises and tries to eat a healthy diet. Continue medication regimen, follow up in 6 months      Relevant Orders   CBC with Differential/Platelet     Musculoskeletal and Integument   Osteopenia of neck of right femur    Patient taking vitamin D supplement, repeat level today. Patient engaging in daily exercises      Relevant Orders   VITAMIN D 25 Hydroxy (Vit-D Deficiency, Fractures)     Other   Hyperlipidemia    Chronic ongoing, continue medication regimen, lipid panel today      Relevant Orders   Lipid Panel w/o Chol/HDL Ratio   Comprehensive metabolic panel   Vitamin D deficiency    Level on 04/07/2021, was 23.3 patient taking vitamin D supplement, continue medication regimen, repeat vitamin D level today      Relevant Orders   VITAMIN D 25 Hydroxy (Vit-D Deficiency, Fractures)     Follow up plan: Return in about 6 months (around 04/05/2022) for HLD/HTN.

## 2021-10-06 LAB — VITAMIN D 25 HYDROXY (VIT D DEFICIENCY, FRACTURES): Vit D, 25-Hydroxy: 45.6 ng/mL (ref 30.0–100.0)

## 2021-10-06 LAB — CBC WITH DIFFERENTIAL/PLATELET
Basophils Absolute: 0 10*3/uL (ref 0.0–0.2)
Basos: 1 %
EOS (ABSOLUTE): 0.4 10*3/uL (ref 0.0–0.4)
Eos: 7 %
Hematocrit: 45.4 % (ref 34.0–46.6)
Hemoglobin: 14.5 g/dL (ref 11.1–15.9)
Immature Grans (Abs): 0 10*3/uL (ref 0.0–0.1)
Immature Granulocytes: 0 %
Lymphocytes Absolute: 2.1 10*3/uL (ref 0.7–3.1)
Lymphs: 38 %
MCH: 29.7 pg (ref 26.6–33.0)
MCHC: 31.9 g/dL (ref 31.5–35.7)
MCV: 93 fL (ref 79–97)
Monocytes Absolute: 0.4 10*3/uL (ref 0.1–0.9)
Monocytes: 7 %
Neutrophils Absolute: 2.6 10*3/uL (ref 1.4–7.0)
Neutrophils: 47 %
Platelets: 324 10*3/uL (ref 150–450)
RBC: 4.88 x10E6/uL (ref 3.77–5.28)
RDW: 13.6 % (ref 11.7–15.4)
WBC: 5.5 10*3/uL (ref 3.4–10.8)

## 2021-10-06 LAB — LIPID PANEL W/O CHOL/HDL RATIO
Cholesterol, Total: 161 mg/dL (ref 100–199)
HDL: 66 mg/dL (ref >39)
LDL Chol Calc (NIH): 78 mg/dL (ref 0–99)
Triglycerides: 95 mg/dL (ref 0–149)
VLDL Cholesterol Cal: 17 mg/dL (ref 5–40)

## 2021-10-06 LAB — COMPREHENSIVE METABOLIC PANEL
ALT: 21 IU/L (ref 0–32)
AST: 18 IU/L (ref 0–40)
Albumin/Globulin Ratio: 1.9 (ref 1.2–2.2)
Albumin: 4.3 g/dL (ref 3.7–4.7)
Alkaline Phosphatase: 63 IU/L (ref 44–121)
BUN/Creatinine Ratio: 11 — ABNORMAL LOW (ref 12–28)
BUN: 9 mg/dL (ref 8–27)
Bilirubin Total: 0.3 mg/dL (ref 0.0–1.2)
CO2: 27 mmol/L (ref 20–29)
Calcium: 9.7 mg/dL (ref 8.7–10.3)
Chloride: 100 mmol/L (ref 96–106)
Creatinine, Ser: 0.83 mg/dL (ref 0.57–1.00)
Globulin, Total: 2.3 g/dL (ref 1.5–4.5)
Glucose: 96 mg/dL (ref 70–99)
Potassium: 3.5 mmol/L (ref 3.5–5.2)
Sodium: 140 mmol/L (ref 134–144)
Total Protein: 6.6 g/dL (ref 6.0–8.5)
eGFR: 74 mL/min/{1.73_m2} (ref >59)

## 2021-10-06 NOTE — Progress Notes (Signed)
Contacted via [place] evening Janet Taylor, your labs have returned and overall everything remains stable.  No changes to medication needed.  You are doing great!! Keep being awesome!!  Thank you for allowing me to participate in your care.  I appreciate you. Kindest regards, Jamice Carreno

## 2022-01-02 ENCOUNTER — Other Ambulatory Visit: Payer: Self-pay | Admitting: Nurse Practitioner

## 2022-01-02 NOTE — Telephone Encounter (Signed)
Requested Prescriptions  Pending Prescriptions Disp Refills   REPATHA 140 MG/ML SOSY [Pharmacy Med Name: REPATHA 140 MG/ML SYRINGE] 6 mL 1    Sig: INJECT 140 MG INTO THE SKIN EVERY 14 (FOURTEEN) DAYS.     Cardiovascular: PCSK9 Inhibitors Passed - 01/02/2022  9:59 AM      Passed - Total Cholesterol in normal range and within 360 days    Cholesterol, Total  Date Value Ref Range Status  10/05/2021 161 100 - 199 mg/dL Final   Cholesterol Piccolo, Waived  Date Value Ref Range Status  04/07/2020 143 <200 mg/dL Final    Comment:                            Desirable                <200                         Borderline High      200- 239                         High                     >239          Passed - LDL in normal range and within 360 days    LDL Chol Calc (NIH)  Date Value Ref Range Status  10/05/2021 78 0 - 99 mg/dL Final         Passed - HDL in normal range and within 360 days    HDL  Date Value Ref Range Status  10/05/2021 66 >39 mg/dL Final         Passed - Triglycerides in normal range and within 360 days    Triglycerides  Date Value Ref Range Status  10/05/2021 95 0 - 149 mg/dL Final   Triglycerides Piccolo,Waived  Date Value Ref Range Status  04/07/2020 91 <150 mg/dL Final    Comment:                            Normal                   <150                         Borderline High     150 - 199                         High                200 - 499                         Very High                >499          Passed - Valid encounter within last 12 months    Recent Outpatient Visits          2 months ago Primary hypertension   [place], Barbaraann Faster, NP   9 months ago Primary hypertension   [place], [place] T, NP   1 year ago Primary hypertension   [place], [place]  T, NP   1 year ago Pure hypercholesterolemia   [place], Barbaraann Faster, NP   1 year ago Essential  hypertension   [place], Barbaraann Faster, NP      Future Appointments            In 3 months [place], Barbaraann Faster, NP [facility]   In 8 months  [facility]

## 2022-01-03 DIAGNOSIS — H401232 Low-tension glaucoma, bilateral, moderate stage: Secondary | ICD-10-CM | POA: Diagnosis not present

## 2022-01-03 DIAGNOSIS — Z961 Presence of intraocular lens: Secondary | ICD-10-CM | POA: Diagnosis not present

## 2022-01-03 DIAGNOSIS — H5203 Hypermetropia, bilateral: Secondary | ICD-10-CM | POA: Diagnosis not present

## 2022-01-03 DIAGNOSIS — H524 Presbyopia: Secondary | ICD-10-CM | POA: Diagnosis not present

## 2022-01-03 DIAGNOSIS — H52223 Regular astigmatism, bilateral: Secondary | ICD-10-CM | POA: Diagnosis not present

## 2022-04-02 NOTE — Patient Instructions (Signed)

## 2022-04-05 ENCOUNTER — Encounter: Payer: Self-pay | Admitting: Nurse Practitioner

## 2022-04-05 ENCOUNTER — Ambulatory Visit (INDEPENDENT_AMBULATORY_CARE_PROVIDER_SITE_OTHER): Payer: Medicare Other | Admitting: Nurse Practitioner

## 2022-04-05 VITALS — BP 124/86 | HR 88 | Temp 97.8°F | Ht 63.5 in | Wt 174.8 lb

## 2022-04-05 DIAGNOSIS — Z6829 Body mass index (BMI) 29.0-29.9, adult: Secondary | ICD-10-CM | POA: Insufficient documentation

## 2022-04-05 DIAGNOSIS — M791 Myalgia, unspecified site: Secondary | ICD-10-CM | POA: Diagnosis not present

## 2022-04-05 DIAGNOSIS — T466X5A Adverse effect of antihyperlipidemic and antiarteriosclerotic drugs, initial encounter: Secondary | ICD-10-CM

## 2022-04-05 DIAGNOSIS — Z6830 Body mass index (BMI) 30.0-30.9, adult: Secondary | ICD-10-CM

## 2022-04-05 DIAGNOSIS — H401231 Low-tension glaucoma, bilateral, mild stage: Secondary | ICD-10-CM | POA: Diagnosis not present

## 2022-04-05 DIAGNOSIS — E559 Vitamin D deficiency, unspecified: Secondary | ICD-10-CM

## 2022-04-05 DIAGNOSIS — E669 Obesity, unspecified: Secondary | ICD-10-CM | POA: Insufficient documentation

## 2022-04-05 DIAGNOSIS — I340 Nonrheumatic mitral (valve) insufficiency: Secondary | ICD-10-CM | POA: Diagnosis not present

## 2022-04-05 DIAGNOSIS — E7800 Pure hypercholesterolemia, unspecified: Secondary | ICD-10-CM

## 2022-04-05 DIAGNOSIS — M85851 Other specified disorders of bone density and structure, right thigh: Secondary | ICD-10-CM | POA: Diagnosis not present

## 2022-04-05 DIAGNOSIS — I10 Essential (primary) hypertension: Secondary | ICD-10-CM

## 2022-04-05 DIAGNOSIS — E6609 Other obesity due to excess calories: Secondary | ICD-10-CM

## 2022-04-05 MED ORDER — HYDROCHLOROTHIAZIDE 25 MG PO TABS: 25.0000 mg | ORAL_TABLET | Freq: Every day | ORAL | 4 refills | Status: DC

## 2022-04-05 MED ORDER — REPATHA 140 MG/ML ~~LOC~~ SOSY: 140.0000 mg | PREFILLED_SYRINGE | SUBCUTANEOUS | 4 refills | Status: DC

## 2022-04-05 MED ORDER — METOPROLOL TARTRATE 50 MG PO TABS: 50.0000 mg | ORAL_TABLET | Freq: Every day | ORAL | 4 refills | Status: DC

## 2022-04-05 NOTE — Assessment & Plan Note (Signed)
Noted on echo, continue to monitor.  No symptoms at this time. 

## 2022-04-05 NOTE — Assessment & Plan Note (Signed)
Chronic, ongoing with Repatha which she is tolerating well.  LDL last visit remains stable.  Praised for success and recommend continuing Repatha, will send refills as needed.  Return in 6 months. Lipid panel today. ?

## 2022-04-05 NOTE — Assessment & Plan Note (Signed)
BMI 30.48.  Recommended eating smaller high protein, low fat meals more frequently and exercising 30 mins a day 5 times a week with a goal of 10-15lb weight loss in the next 3 months. Patient voiced their understanding and motivation to adhere to these recommendations.  

## 2022-04-05 NOTE — Progress Notes (Signed)
? ?BP 124/86   Pulse 88   Temp 97.8 ?F (36.6 ?C) (Oral)   Ht 5' 3.5" (1.613 m)   Wt 174 lb 12.8 oz (79.3 kg)   LMP  (LMP Unknown)   SpO2 99%   BMI 30.48 kg/m?   ? ?Subjective:  ? ? Patient ID: Janet Taylor, female    DOB: 1946/12/07, 76 y.o.   MRN: [id number] ? ?HPI: ?Janet Taylor is a 76 y.o. female ? ?Chief Complaint  ?Patient presents with  ? Hypertension  ? Hyperlipidemia  ? ?Continues to be followed by ophthalmology every 3 months for glaucoma, this is remaining stable with daily eye drops. ? ?HYPERTENSION / HYPERLIPIDEMIA ?Continues on HCTZ, Metoprolol, and Repatha.  History of low Vitamin D levels, last was 45.6, taking daily supplement.  Saw cardiology in 08/20/21 -- last echo 08/19/21 showed EF >55% ?Satisfied with current treatment? yes ?Duration of hypertension: chronic ?BP monitoring frequency: twice a week ?BP range: 120 -130/70 range at home ?BP medication side effects: no ?Duration of hyperlipidemia: chronic ?Cholesterol medication side effects: no ?Cholesterol supplements: fish oil ?Medication compliance: good compliance ?Aspirin: no ?Recent stressors: no ?Recurrent headaches: no ?Visual changes: no ?Palpitations: no ?Dyspnea: no ?Chest pain: no ?Lower extremity edema: no ?Dizzy/lightheaded: no  ? ?OSTEOPENIA ?Noted on DEXA in June 2020 with T Score -1.1.  Continues on Vitamin D supplement daily + protein powder. ?Satisfied with current treatment?: yes ?Adequate calcium & vitamin D: yes ?Intolerance to bisphosphonates:no ?Weight bearing exercises: yes ? ?Relevant past medical, surgical, family and social history reviewed and updated as indicated. Interim medical history since our last visit reviewed. ?Allergies and medications reviewed and updated. ? ?Review of Systems  ?Constitutional:  Negative for activity change, appetite change, diaphoresis, fatigue and fever.  ?Respiratory:  Negative for cough, chest tightness and shortness of breath.   ?Cardiovascular:  Negative for chest pain,  palpitations and leg swelling.  ?Gastrointestinal: Negative.   ?Neurological: Negative.   ?Psychiatric/Behavioral: Negative.    ? ?Per HPI unless specifically indicated above ? ?   ?Objective:  ?  ?BP 124/86   Pulse 88   Temp 97.8 ?F (36.6 ?C) (Oral)   Ht 5' 3.5" (1.613 m)   Wt 174 lb 12.8 oz (79.3 kg)   LMP  (LMP Unknown)   SpO2 99%   BMI 30.48 kg/m?   ?Wt Readings from Last 3 Encounters:  ?04/05/22 174 lb 12.8 oz (79.3 kg)  ?10/05/21 169 lb 3.2 oz (76.7 kg)  ?08/30/21 168 lb (76.2 kg)  ?  ?Physical Exam ?Vitals and nursing note reviewed.  ?Constitutional:   ?   General: She is awake. She is not in acute distress. ?   Appearance: She is well-developed and overweight. She is not ill-appearing.  ?HENT:  ?   Head: Normocephalic.  ?   Right Ear: Hearing normal.  ?   Left Ear: Hearing normal.  ?Eyes:  ?   General: Lids are normal.     ?   Right eye: No discharge.     ?   Left eye: No discharge.  ?   Conjunctiva/sclera: Conjunctivae normal.  ?   Pupils: Pupils are equal, round, and reactive to light.  ?Neck:  ?   Thyroid: No thyromegaly.  ?   Vascular: No carotid bruit.  ?Cardiovascular:  ?   Rate and Rhythm: Normal rate and regular rhythm.  ?   Heart sounds: Normal heart sounds. No murmur heard. ?  No gallop.  ?Pulmonary:  ?  Effort: Pulmonary effort is normal. No accessory muscle usage or respiratory distress.  ?   Breath sounds: Normal breath sounds.  ?Abdominal:  ?   General: Bowel sounds are normal.  ?   Palpations: Abdomen is soft. There is no hepatomegaly or splenomegaly.  ?Musculoskeletal:  ?   Cervical back: Normal range of motion and neck supple.  ?   Right lower leg: No edema.  ?   Left lower leg: No edema.  ?Skin: ?   General: Skin is warm and dry.  ?Neurological:  ?   Mental Status: She is alert and oriented to person, place, and time.  ?Psychiatric:     ?   Attention and Perception: Attention normal.     ?   Mood and Affect: Mood normal.     ?   Speech: Speech normal.     ?   Behavior: Behavior  normal. Behavior is cooperative.     ?   Thought Content: Thought content normal.  ? ?Results for orders placed or performed in visit on 10/05/21  ?Lipid Panel w/o Chol/HDL Ratio  ?Result Value Ref Range  ? Cholesterol, Total 161 100 - 199 mg/dL  ? Triglycerides 95 0 - 149 mg/dL  ? HDL 66 >39 mg/dL  ? VLDL Cholesterol Cal 17 5 - 40 mg/dL  ? LDL Chol Calc (NIH) 78 0 - 99 mg/dL  ?CBC with Differential/Platelet  ?Result Value Ref Range  ? WBC 5.5 3.4 - 10.8 x10E3/uL  ? RBC 4.88 3.77 - 5.28 x10E6/uL  ? Hemoglobin 14.5 11.1 - 15.9 g/dL  ? Hematocrit 45.4 34.0 - 46.6 %  ? MCV 93 79 - 97 fL  ? MCH 29.7 26.6 - 33.0 pg  ? MCHC 31.9 31.5 - 35.7 g/dL  ? RDW 13.6 11.7 - 15.4 %  ? Platelets 324 150 - 450 x10E3/uL  ? Neutrophils 47 Not Estab. %  ? Lymphs 38 Not Estab. %  ? Monocytes 7 Not Estab. %  ? Eos 7 Not Estab. %  ? Basos 1 Not Estab. %  ? Neutrophils Absolute 2.6 1.4 - 7.0 x10E3/uL  ? Lymphocytes Absolute 2.1 0.7 - 3.1 x10E3/uL  ? Monocytes Absolute 0.4 0.1 - 0.9 x10E3/uL  ? EOS (ABSOLUTE) 0.4 0.0 - 0.4 x10E3/uL  ? Basophils Absolute 0.0 0.0 - 0.2 x10E3/uL  ? Immature Granulocytes 0 Not Estab. %  ? Immature Grans (Abs) 0.0 0.0 - 0.1 x10E3/uL  ?Comprehensive metabolic panel  ?Result Value Ref Range  ? Glucose 96 70 - 99 mg/dL  ? BUN 9 8 - 27 mg/dL  ? Creatinine, Ser 0.83 0.57 - 1.00 mg/dL  ? eGFR 74 >59 mL/min/1.73  ? BUN/Creatinine Ratio 11 (L) 12 - 28  ? Sodium 140 134 - 144 mmol/L  ? Potassium 3.5 3.5 - 5.2 mmol/L  ? Chloride 100 96 - 106 mmol/L  ? CO2 27 20 - 29 mmol/L  ? Calcium 9.7 8.7 - 10.3 mg/dL  ? Total Protein 6.6 6.0 - 8.5 g/dL  ? Albumin 4.3 3.7 - 4.7 g/dL  ? Globulin, Total 2.3 1.5 - 4.5 g/dL  ? Albumin/Globulin Ratio 1.9 1.2 - 2.2  ? Bilirubin Total 0.3 0.0 - 1.2 mg/dL  ? Alkaline Phosphatase 63 44 - 121 IU/L  ? AST 18 0 - 40 IU/L  ? ALT 21 0 - 32 IU/L  ?VITAMIN D 25 Hydroxy (Vit-D Deficiency, Fractures)  ?Result Value Ref Range  ? Vit D, 25-Hydroxy 45.6 30.0 - 100.0 ng/mL  ? ?   ?  Assessment & Plan:   ? ?Problem List Items Addressed This Visit   ? ?  ? Cardiovascular and Mediastinum  ? Hypertension - Primary  ?  Chronic, stable with BP at goal at home on readings and in office today.  Continue daily BP checks at home and current medication regimen + collaboration with cardiology.  DASH diet focus.  BMP today.  Refills sent.  Return in 6 months for follow-up. ? ?  ?  ? Relevant Medications  ? metoprolol tartrate (LOPRESSOR) 50 MG tablet  ? Evolocumab (REPATHA) 140 MG/ML SOSY  ? hydrochlorothiazide (HYDRODIURIL) 25 MG tablet  ? Other Relevant Orders  ? Basic metabolic panel  ? Mild mitral regurgitation by prior echocardiogram  ?  Noted on echo, continue to monitor.  No symptoms at this time. ? ?  ?  ? Relevant Medications  ? metoprolol tartrate (LOPRESSOR) 50 MG tablet  ? Evolocumab (REPATHA) 140 MG/ML SOSY  ? hydrochlorothiazide (HYDRODIURIL) 25 MG tablet  ?  ? Musculoskeletal and Integument  ? Osteopenia of neck of right femur  ?  On DEXA 2020.  Recommend continued supplement daily, Vit D and Calcium + weight bearing exercises.  Repeat DEXA in 2025.  Check Vit D level next visit. ? ?  ?  ?  ? Other  ? Hyperlipidemia  ?  Chronic, ongoing with Repatha which she is tolerating well.  LDL last visit remains stable.  Praised for success and recommend continuing Repatha, will send refills as needed.  Return in 6 months. Lipid panel today. ? ?  ?  ? Relevant Medications  ? metoprolol tartrate (LOPRESSOR) 50 MG tablet  ? Evolocumab (REPATHA) 140 MG/ML SOSY  ? hydrochlorothiazide (HYDRODIURIL) 25 MG tablet  ? Other Relevant Orders  ? Lipid Panel w/o Chol/HDL Ratio  ? Low-tension glaucoma, bilateral, mild stage  ?  Followed by Dr. Matilde Sprang, continue this collaboration. ? ?  ?  ? Myalgia due to statin  ?  Tolerating Repatha with improved LDL, continue this regimen and adjust as needed. ? ?  ?  ? Obesity  ?  BMI 30.48.  Recommended eating smaller high protein, low fat meals more frequently and exercising 30 mins a day 5 times a  week with a goal of 10-15lb weight loss in the next 3 months. Patient voiced their understanding and motivation to adhere to these recommendations. ? ? ?  ?  ? Vitamin D deficiency  ?  Ongoing, stable.  Continue weekly daily supplement.

## 2022-04-05 NOTE — Assessment & Plan Note (Addendum)
Chronic, stable with BP at goal at home on readings and in office today.  Continue daily BP checks at home and current medication regimen + collaboration with cardiology.  DASH diet focus.  BMP today.  Refills sent.  Return in 6 months for follow-up. ?

## 2022-04-05 NOTE — Assessment & Plan Note (Signed)
Tolerating Repatha with improved LDL, continue this regimen and adjust as needed. 

## 2022-04-05 NOTE — Assessment & Plan Note (Signed)
On DEXA 2020.  Recommend continued supplement daily, Vit D and Calcium + weight bearing exercises.  Repeat DEXA in 2025.  Check Vit D level next visit. ?

## 2022-04-05 NOTE — Assessment & Plan Note (Signed)
Followed by Dr. Matilde Sprang, continue this collaboration. ?

## 2022-04-05 NOTE — Assessment & Plan Note (Signed)
Ongoing, stable.  Continue weekly daily supplement.  Vitamin D level next visit. ?

## 2022-04-06 ENCOUNTER — Telehealth: Payer: Self-pay | Admitting: Nurse Practitioner

## 2022-04-06 DIAGNOSIS — Z20822 Contact with and (suspected) exposure to covid-19: Secondary | ICD-10-CM | POA: Diagnosis not present

## 2022-04-06 LAB — LIPID PANEL W/O CHOL/HDL RATIO
Cholesterol, Total: 132 mg/dL (ref 100–199)
HDL: 68 mg/dL (ref >39)
LDL Chol Calc (NIH): 48 mg/dL (ref 0–99)
Triglycerides: 84 mg/dL (ref 0–149)
VLDL Cholesterol Cal: 16 mg/dL (ref 5–40)

## 2022-04-06 LAB — BASIC METABOLIC PANEL
BUN/Creatinine Ratio: 12 (ref 12–28)
BUN: 8 mg/dL (ref 8–27)
CO2: 25 mmol/L (ref 20–29)
Calcium: 9.5 mg/dL (ref 8.7–10.3)
Chloride: 104 mmol/L (ref 96–106)
Creatinine, Ser: 0.68 mg/dL (ref 0.57–1.00)
Glucose: 94 mg/dL (ref 70–99)
Potassium: 4 mmol/L (ref 3.5–5.2)
Sodium: 142 mmol/L (ref 134–144)
eGFR: 91 mL/min/{1.73_m2} (ref >59)

## 2022-04-06 NOTE — Progress Notes (Signed)
Contacted via [facility] ? ? ?Good morning Murdis, first off look at that LDL (bad cholesterol) level which is now at 48!! Amazing.  Continue Repatha which is offering benefit.  Kidney function, creatinine and eGFR, remains normal.  Any questions? ?Keep being amazing!!  Thank you for allowing me to participate in your care.  I appreciate you. ?Kindest regards, ?Oslo Huntsman ?

## 2022-04-06 NOTE — Telephone Encounter (Signed)
Copied from [place]. Topic: General - Other ?>> Apr 06, 2022 10:26 AM Pawlus, Brayton Layman A wrote: ?Reason for CRM: Pt wanted a call back to go over latest lab results, pt had further questions, specifically regarding Vit D, please advise. ?

## 2022-04-06 NOTE — Telephone Encounter (Signed)
Attempted to return patient call, no answer unable to LVM.  ?

## 2022-04-06 NOTE — Telephone Encounter (Signed)
Spoke with patient and answered all questions she had regarding her lab results. Patient was informed that her Vitamin D3 level have not been checked since October of 2022. Patient was informed of her recent lab results. Patient verbalized understanding and has no further questions.  ?

## 2022-04-07 DIAGNOSIS — H52223 Regular astigmatism, bilateral: Secondary | ICD-10-CM | POA: Diagnosis not present

## 2022-04-07 DIAGNOSIS — H5203 Hypermetropia, bilateral: Secondary | ICD-10-CM | POA: Diagnosis not present

## 2022-04-07 DIAGNOSIS — Z961 Presence of intraocular lens: Secondary | ICD-10-CM | POA: Diagnosis not present

## 2022-04-07 DIAGNOSIS — H401232 Low-tension glaucoma, bilateral, moderate stage: Secondary | ICD-10-CM | POA: Diagnosis not present

## 2022-04-07 DIAGNOSIS — H524 Presbyopia: Secondary | ICD-10-CM | POA: Diagnosis not present

## 2022-04-15 DIAGNOSIS — Z23 Encounter for immunization: Secondary | ICD-10-CM | POA: Diagnosis not present

## 2022-07-14 IMAGING — MG DIGITAL SCREENING BILAT W/ TOMO W/ CAD
8 series · 8 of 24 positions shown · non-contrast
Comparison: Previous exam(s).

CLINICAL DATA: Screening.

EXAM:
DIGITAL SCREENING BILATERAL MAMMOGRAM WITH TOMO AND CAD

[L MLO synth-2D]
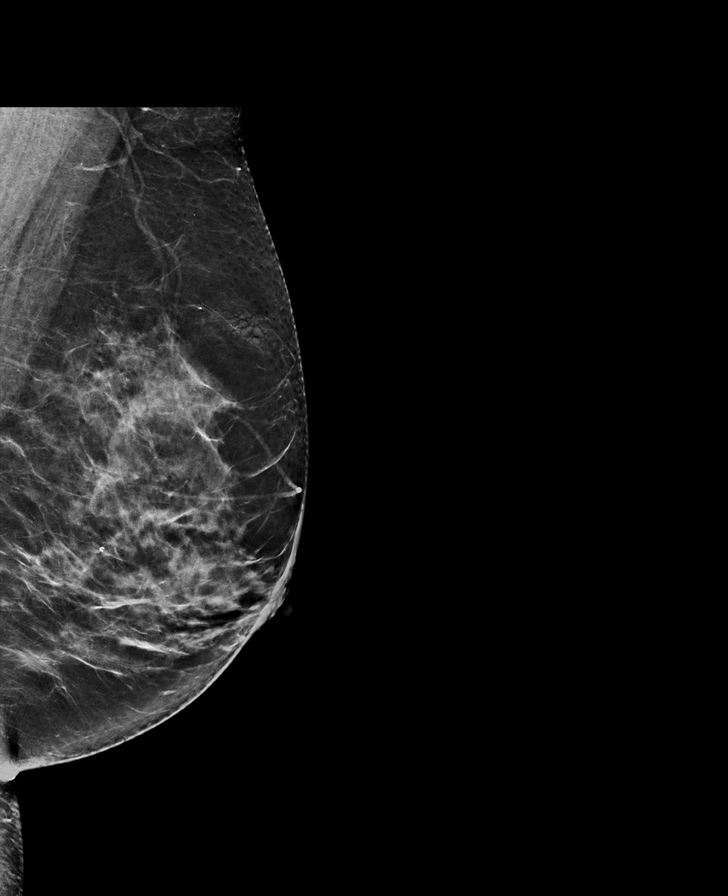

[R CC synth-2D]
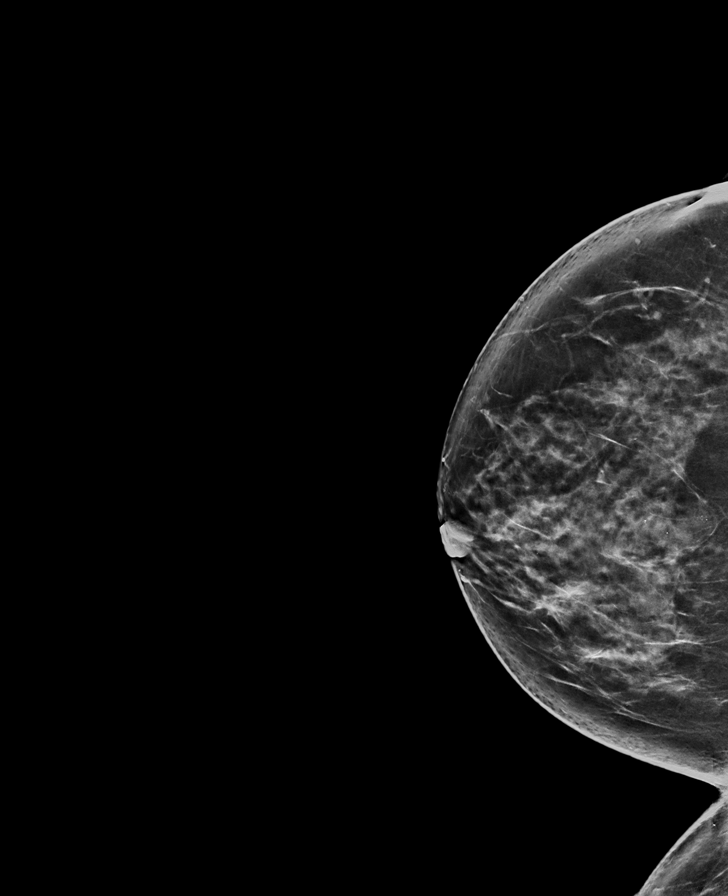

[L CC synth-2D]
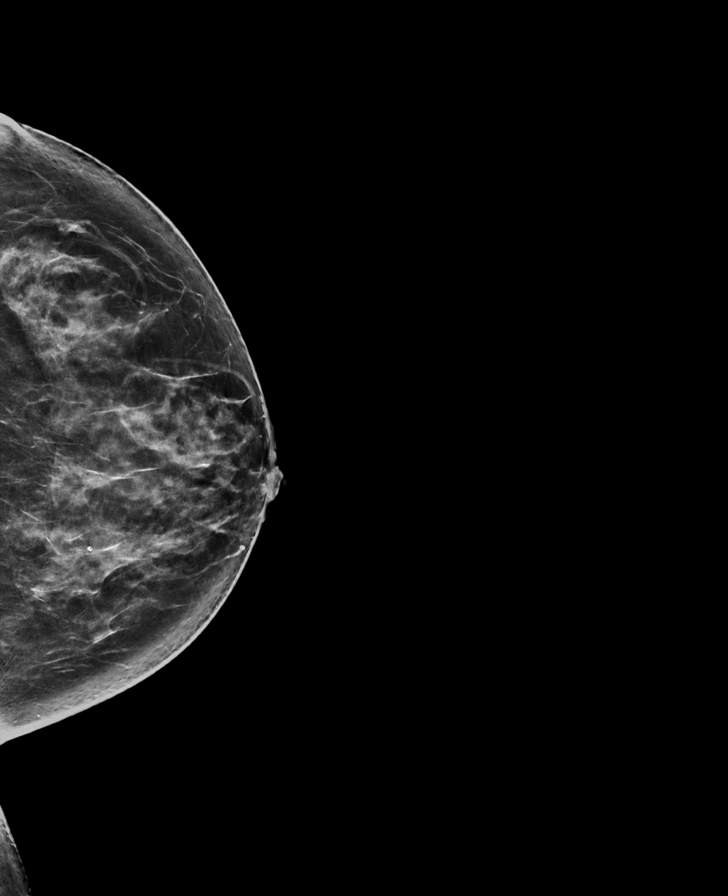

[R MLO synth-2D]
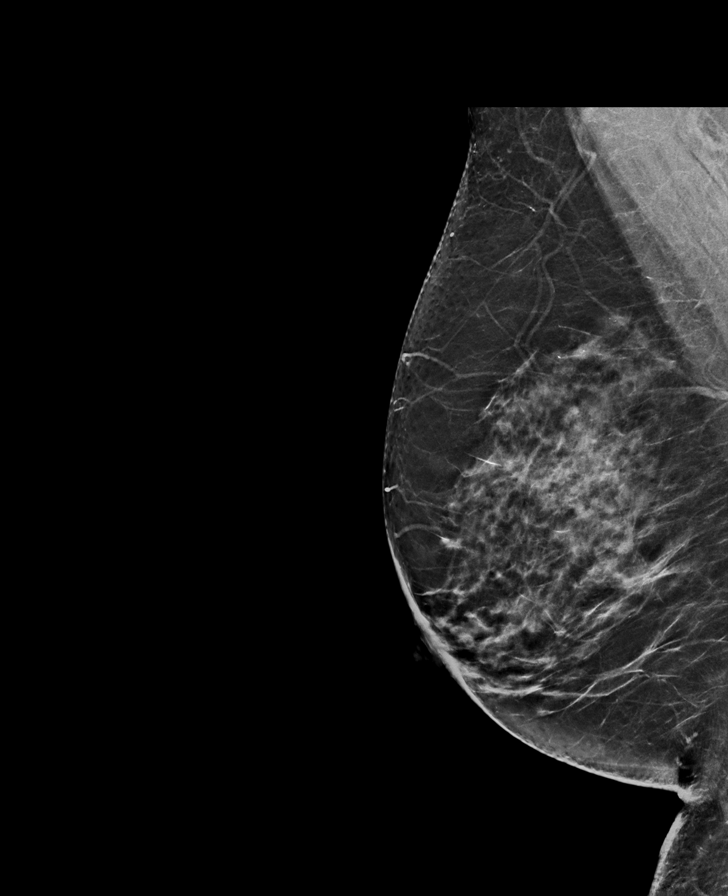

[L CC tomo · tomo slice 39/78.0]
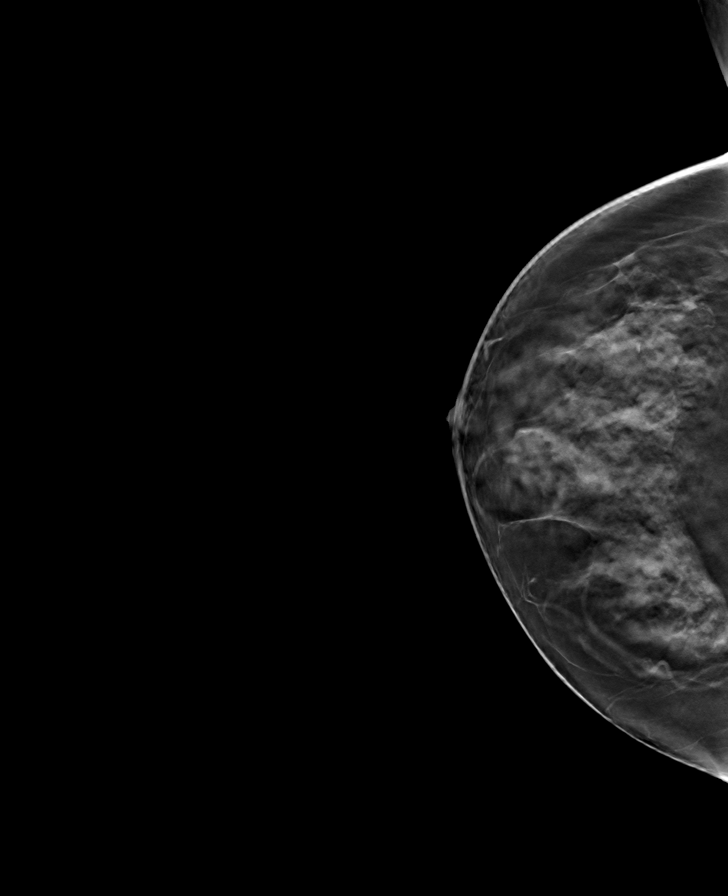

[R CC tomo · tomo slice 39/77.0]
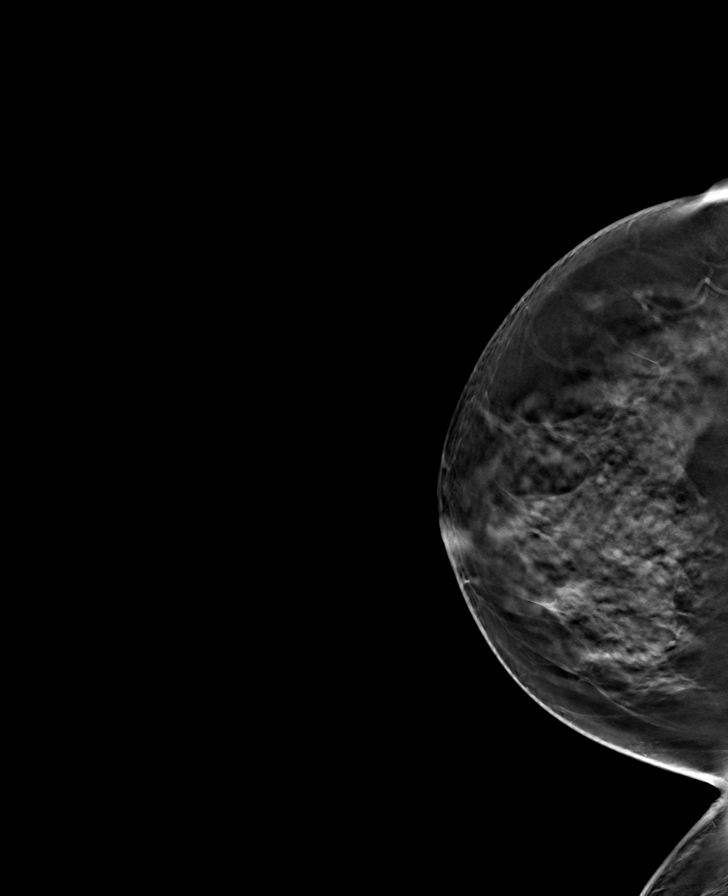

[L MLO tomo · tomo slice 35/70.0]
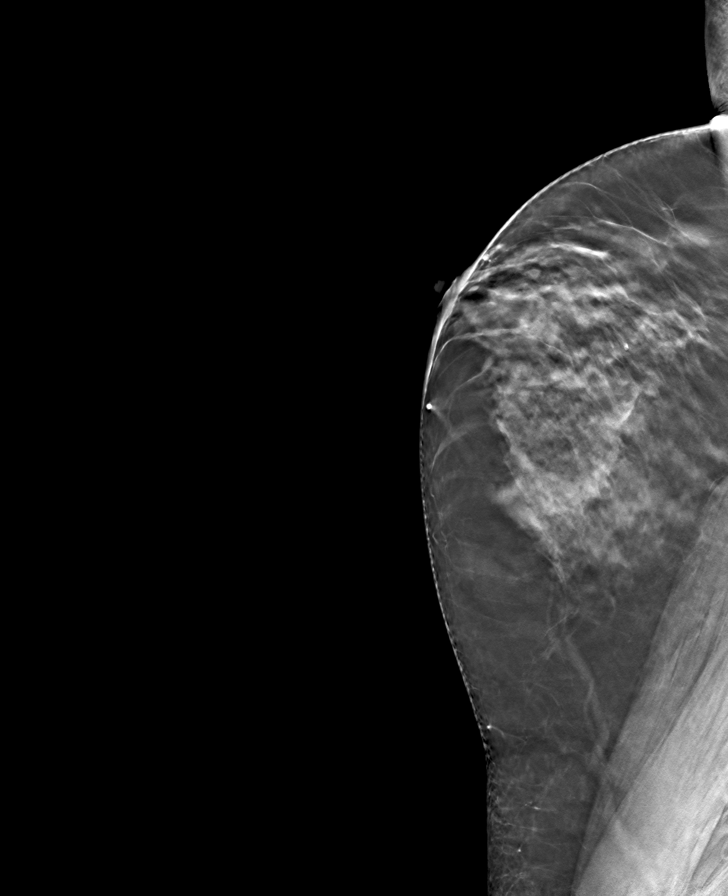

[R MLO tomo · tomo slice 37/72.0]
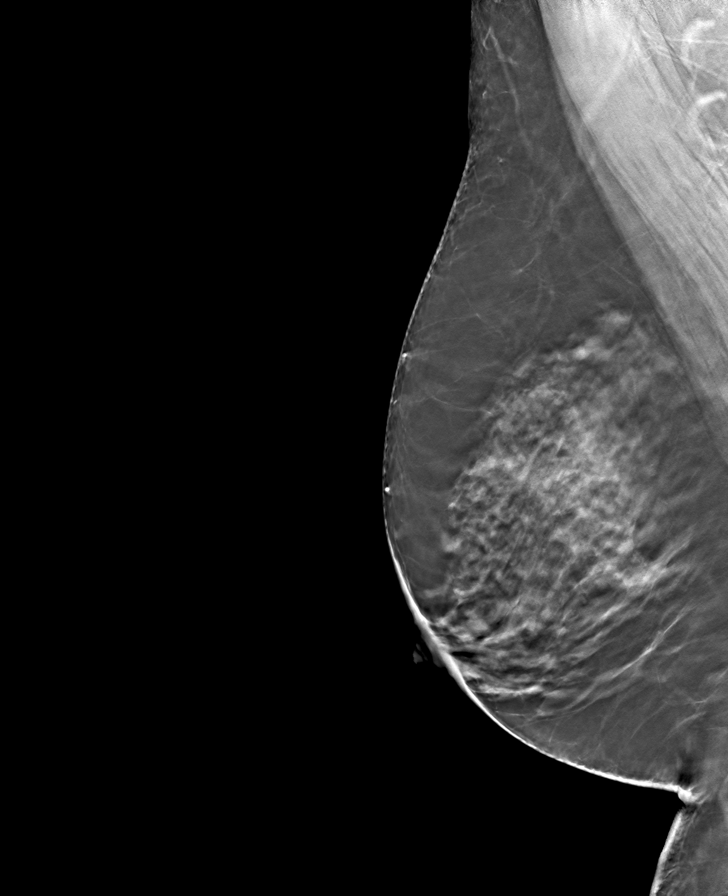

[8 of 24 positions shown; findings below may reference images not displayed]

ACR Breast Density Category c: The breast tissue is heterogeneously
dense, which may obscure small masses.
FINDINGS: In the right breast, calcifications warrant further evaluation with
magnified views. They are seen in the RIGHT breast at 12 o'clock
posterior depth. In the left breast, no findings suspicious for
malignancy. Images were processed with CAD.
IMPRESSION: Further evaluation is suggested for calcifications in the right
breast.

RECOMMENDATION:
Diagnostic mammogram of the right breast. (Code:[id number])

The patient will be contacted regarding the findings, and additional
imaging will be scheduled.

BI-RADS CATEGORY  0: Incomplete. Need additional imaging evaluation
and/or prior mammograms for comparison.

## 2022-08-01 DIAGNOSIS — H5203 Hypermetropia, bilateral: Secondary | ICD-10-CM | POA: Diagnosis not present

## 2022-08-01 DIAGNOSIS — H52223 Regular astigmatism, bilateral: Secondary | ICD-10-CM | POA: Diagnosis not present

## 2022-08-01 DIAGNOSIS — Z961 Presence of intraocular lens: Secondary | ICD-10-CM | POA: Diagnosis not present

## 2022-08-01 DIAGNOSIS — H401232 Low-tension glaucoma, bilateral, moderate stage: Secondary | ICD-10-CM | POA: Diagnosis not present

## 2022-08-01 DIAGNOSIS — H524 Presbyopia: Secondary | ICD-10-CM | POA: Diagnosis not present

## 2022-08-16 DIAGNOSIS — I10 Essential (primary) hypertension: Secondary | ICD-10-CM | POA: Diagnosis not present

## 2022-08-16 DIAGNOSIS — E782 Mixed hyperlipidemia: Secondary | ICD-10-CM | POA: Diagnosis not present

## 2022-08-16 DIAGNOSIS — I340 Nonrheumatic mitral (valve) insufficiency: Secondary | ICD-10-CM | POA: Diagnosis not present

## 2022-08-16 DIAGNOSIS — R9431 Abnormal electrocardiogram [ECG] [EKG]: Secondary | ICD-10-CM | POA: Diagnosis not present

## 2022-08-22 ENCOUNTER — Other Ambulatory Visit: Payer: Self-pay | Admitting: Nurse Practitioner

## 2022-08-22 DIAGNOSIS — Z1231 Encounter for screening mammogram for malignant neoplasm of breast: Secondary | ICD-10-CM

## 2022-09-01 ENCOUNTER — Ambulatory Visit (INDEPENDENT_AMBULATORY_CARE_PROVIDER_SITE_OTHER): Payer: Medicare Other | Admitting: *Deleted

## 2022-09-01 DIAGNOSIS — Z0000 Encounter for general adult medical examination without abnormal findings: Secondary | ICD-10-CM | POA: Diagnosis not present

## 2022-09-01 DIAGNOSIS — Z23 Encounter for immunization: Secondary | ICD-10-CM | POA: Diagnosis not present

## 2022-09-01 NOTE — Patient Instructions (Signed)
[name] , Thank you for taking time to come for your Medicare Wellness Visit. I appreciate your ongoing commitment to your health goals. Please review the following plan we discussed and let me know if I can assist you in the future.   Screening recommendations/referrals: Colonoscopy: speak with MD at upcoming appointment Mammogram: scheduled Bone Density: up to date Recommended yearly ophthalmology/optometry visit for glaucoma screening and checkup Recommended yearly dental visit for hygiene and checkup  Vaccinations: Influenza vaccine: up to date Pneumococcal vaccine: up to date Tdap vaccine: up to date Shingles vaccine: Education provided    Advanced directives: not on file    Next appointment: 10-05-2022 @ 10:00 cannady   Preventive Care 75 Years and Older, Female Preventive care refers to lifestyle choices and visits with your health care provider that can promote health and wellness. What does preventive care include? A yearly physical exam. This is also called an annual well check. Dental exams once or twice a year. Routine eye exams. Ask your health care provider how often you should have your eyes checked. Personal lifestyle choices, including: Daily care of your teeth and gums. Regular physical activity. Eating a healthy diet. Avoiding tobacco and drug use. Limiting alcohol use. Practicing safe sex. Taking low-dose aspirin every day. Taking vitamin and mineral supplements as recommended by your health care provider. What happens during an annual well check? The services and screenings done by your health care provider during your annual well check will depend on your age, overall health, lifestyle risk factors, and family history of disease. Counseling  Your health care provider may ask you questions about your: Alcohol use. Tobacco use. Drug use. Emotional well-being. Home and relationship well-being. Sexual activity. Eating habits. History of  falls. Memory and ability to understand (cognition). Work and work Statistician. Reproductive health. Screening  You may have the following tests or measurements: Height, weight, and BMI. Blood pressure. Lipid and cholesterol levels. These may be checked every 5 years, or more frequently if you are over 26 years old. Skin check. Lung cancer screening. You may have this screening every year starting at age 29 if you have a 30-pack-year history of smoking and currently smoke or have quit within the past 15 years. Fecal occult blood test (FOBT) of the stool. You may have this test every year starting at age 40. Flexible sigmoidoscopy or colonoscopy. You may have a sigmoidoscopy every 5 years or a colonoscopy every 10 years starting at age 75. Hepatitis C blood test. Hepatitis B blood test. Sexually transmitted disease (STD) testing. Diabetes screening. This is done by checking your blood sugar (glucose) after you have not eaten for a while (fasting). You may have this done every 1-3 years. Bone density scan. This is done to screen for osteoporosis. You may have this done starting at age 77. Mammogram. This may be done every 1-2 years. Talk to your health care provider about how often you should have regular mammograms. Talk with your health care provider about your test results, treatment options, and if necessary, the need for more tests. Vaccines  Your health care provider may recommend certain vaccines, such as: Influenza vaccine. This is recommended every year. Tetanus, diphtheria, and acellular pertussis (Tdap, Td) vaccine. You may need a Td booster every 10 years. Zoster vaccine. You may need this after age 73. Pneumococcal 13-valent conjugate (PCV13) vaccine. One dose is recommended after age 61. Pneumococcal polysaccharide (PPSV23) vaccine. One dose is recommended after age 13. Talk to your health care provider  about which screenings and vaccines you need and how often you need  them. This information is not intended to replace advice given to you by your health care provider. Make sure you discuss any questions you have with your health care provider. Document Released: 12/25/2015 Document Revised: 08/17/2016 Document Reviewed: 09/29/2015 Elsevier Interactive Patient Education  2017 [place] Prevention in the Home Falls can cause injuries. They can happen to people of all ages. There are many things you can do to make your home safe and to help prevent falls. What can I do on the outside of my home? Regularly fix the edges of walkways and driveways and fix any cracks. Remove anything that might make you trip as you walk through a door, such as a raised step or threshold. Trim any bushes or trees on the path to your home. Use bright outdoor lighting. Clear any walking paths of anything that might make someone trip, such as rocks or tools. Regularly check to see if handrails are loose or broken. Make sure that both sides of any steps have handrails. Any raised decks and porches should have guardrails on the edges. Have any leaves, snow, or ice cleared regularly. Use sand or salt on walking paths during winter. Clean up any spills in your garage right away. This includes oil or grease spills. What can I do in the bathroom? Use night lights. Install grab bars by the toilet and in the tub and shower. Do not use towel bars as grab bars. Use non-skid mats or decals in the tub or shower. If you need to sit down in the shower, use a plastic, non-slip stool. Keep the floor dry. Clean up any water that spills on the floor as soon as it happens. Remove soap buildup in the tub or shower regularly. Attach bath mats securely with double-sided non-slip rug tape. Do not have throw rugs and other things on the floor that can make you trip. What can I do in the bedroom? Use night lights. Make sure that you have a light by your bed that is easy to reach. Do not use  any sheets or blankets that are too big for your bed. They should not hang down onto the floor. Have a firm chair that has side arms. You can use this for support while you get dressed. Do not have throw rugs and other things on the floor that can make you trip. What can I do in the kitchen? Clean up any spills right away. Avoid walking on wet floors. Keep items that you use a lot in easy-to-reach places. If you need to reach something above you, use a strong step stool that has a grab bar. Keep electrical cords out of the way. Do not use floor polish or wax that makes floors slippery. If you must use wax, use non-skid floor wax. Do not have throw rugs and other things on the floor that can make you trip. What can I do with my stairs? Do not leave any items on the stairs. Make sure that there are handrails on both sides of the stairs and use them. Fix handrails that are broken or loose. Make sure that handrails are as long as the stairways. Check any carpeting to make sure that it is firmly attached to the stairs. Fix any carpet that is loose or worn. Avoid having throw rugs at the top or bottom of the stairs. If you do have throw rugs, attach them to the floor  with carpet tape. Make sure that you have a light switch at the top of the stairs and the bottom of the stairs. If you do not have them, ask someone to add them for you. What else can I do to help prevent falls? Wear shoes that: Do not have high heels. Have rubber bottoms. Are comfortable and fit you well. Are closed at the toe. Do not wear sandals. If you use a stepladder: Make sure that it is fully opened. Do not climb a closed stepladder. Make sure that both sides of the stepladder are locked into place. Ask someone to hold it for you, if possible. Clearly mark and make sure that you can see: Any grab bars or handrails. First and last steps. Where the edge of each step is. Use tools that help you move around (mobility aids)  if they are needed. These include: Canes. Walkers. Scooters. Crutches. Turn on the lights when you go into a dark area. Replace any light bulbs as soon as they burn out. Set up your furniture so you have a clear path. Avoid moving your furniture around. If any of your floors are uneven, fix them. If there are any pets around you, be aware of where they are. Review your medicines with your doctor. Some medicines can make you feel dizzy. This can increase your chance of falling. Ask your doctor what other things that you can do to help prevent falls. This information is not intended to replace advice given to you by your health care provider. Make sure you discuss any questions you have with your health care provider. Document Released: 09/24/2009 Document Revised: 05/05/2016 Document Reviewed: 01/02/2015 Elsevier Interactive Patient Education  2017 [employer].

## 2022-09-01 NOTE — Progress Notes (Signed)
Subjective:   LATESSA TILLIS is a 76 y.o. female who presents for Medicare Annual (Subsequent) preventive examination.   I connected with  Kendyl B Orban on 09/01/22 by a telephone enabled telemedicine application and verified that I am speaking with the correct person using two identifiers.   I discussed the limitations of evaluation and management by telemedicine. The patient expressed understanding and agreed to proceed.  Patient location: home  Provider location: Tele-health-home    Review of Systems       Cardiac Risk Factors include: advanced age (>40mn, >>69women);hypertension     Objective:    Today's Vitals   There is no height or weight on file to calculate BMI.     09/01/2022    9:33 AM 08/30/2021    9:45 AM 08/28/2020    9:45 AM 08/08/2019   11:08 AM 08/03/2018   11:03 AM 06/11/2018   12:11 PM 10/02/2017    8:30 AM  Advanced Directives  Does Patient Have a Medical Advance Directive? Yes Yes Yes No Yes No Yes  Type of [place]aramed[place]of [place] will [place] will  [place] will  [place] will  Copy of H[place]hem[place]ain Chart? No - copy requested No - copy requested No - copy requested  No - copy requested  No - copy requested  Would patient like information on creating a medical advance directive?      No - Patient declined     Current Medications (verified) Outpatient Encounter Medications as of 09/01/2022  Medication Sig   cholecalciferol (VITAMIN D3) 25 MCG (1000 UNIT) tablet Take 1,000 Units by mouth daily.   Cod Liver Oil 1000 MG CAPS Take 1,000 capsules by mouth daily.   Evolocumab (REPATHA) 140 MG/ML SOSY Inject 140 mg into the skin every 14 (fourteen) days.   hydrochlorothiazide (HYDRODIURIL) 25 MG tablet Take 1 tablet (25 mg total) by mouth daily.   hydrocortisone 2.5 % cream APPLY TO AFFECTED AREA TWICE A  DAY   latanoprost (XALATAN) 0.005 % ophthalmic solution 1 drop at bedtime.   metoprolol tartrate (LOPRESSOR) 50 MG tablet Take 1 tablet (50 mg total) by mouth daily.   No facility-administered encounter medications on file as of 09/01/2022.    Allergies (verified) Hydrocodone, Lipitor [atorvastatin], Morphine and related, Rosuvastatin, and Zetia [ezetimibe]   History: Past Medical History:  Diagnosis Date   Eczema herpeticum    Hyperlipidemia    Hypertension    Menopausal disorder    Osteopenia    Past Surgical History:  Procedure Laterality Date   ABDOMINAL HYSTERECTOMY     complete   BREAST BIOPSY Right 2012   NEG   BREAST BIOPSY Right 09/16/2020   stereo bx of calcs, x marker, path pending   CATARACT EXTRACTION Right 10/03/2016   cataract surgery Left    09/12/16   COLONOSCOPY WITH PROPOFOL N/A 10/02/2017   Procedure: COLONOSCOPY WITH PROPOFOL;  Surgeon: AJonathon Bellows MD;  [facility]  Service: Gastroenterology;  Laterality: N/A;   EYE SURGERY Bilateral 09/12/2016   bilateral cataract surgery    EYE SURGERY  10/03/2016   Family History  Problem Relation Age of Onset   Hypertension Father    Hypertension Sister    Breast cancer Neg Hx    Social History   Socioeconomic History   Marital status: Divorced    Spouse name: Not on file   Number of  children: Not on file   Years of education: Not on file   Highest education level: Bachelor's degree (e.g., BA, AB, BS)  Occupational History   Occupation: retired  Tobacco Use   Smoking status: Never   Smokeless tobacco: Never  Vaping Use   Vaping Use: Never used  Substance and Sexual Activity   Alcohol use: Yes    Alcohol/week: 3.0 standard drinks of alcohol    Types: 3 Glasses of wine per week    Comment: on weekends   Drug use: No   Sexual activity: Not Currently  Other Topics Concern   Not on file  Social History Narrative   Walks outside for 30 minutes 3 days a week, works outside in [place] Strain: [place]  (09/01/2022)   Overall Financial Resource Strain (CARDIA)    Difficulty of Paying Living Expenses: Not hard at all  Food Insecurity: No Food Insecurity (09/01/2022)   Hunger Vital Sign    Worried About Running Out of Food in the Last Year: Never true    [place] in the Last Year: Never true  Transportation Needs: No Transportation Needs (09/01/2022)   PRAPARE - Hydrologist (Medical): No    Lack of Transportation (Non-Medical): No  Physical Activity: Insufficiently Active (09/01/2022)   Exercise Vital Sign    Days of Exercise per Week: 2 days    Minutes of Exercise per Session: 30 min  Stress: No Stress Concern Present (09/01/2022)   [place]    Feeling of Stress : Not at all  Social Connections: Moderately Isolated (09/01/2022)   Social Connection and Isolation Panel [NHANES]    Frequency of Communication with Friends and Family: More than three times a week    Frequency of Social Gatherings with Friends and Family: Twice a week    Attends Religious Services: More than 4 times per year    Active Member of [employer] or Organizations: No    Attends Music therapist: Never    Marital Status: Divorced    Tobacco Counseling Counseling given: Not Answered   Clinical Intake:  Pre-visit preparation completed: Yes  Pain : No/denies pain     Diabetes: No  How often do you need to have someone help you when you read instructions, pamphlets, or other written materials from your doctor or pharmacy?: 1 - Never  Diabetic?  no  Interpreter Needed?: No  Information entered by :: Leroy Kennedy LPN   Activities of Daily Living    09/01/2022    9:49 AM  In your present state of health, do you have any difficulty performing the following activities:  Hearing? 0  Vision? 0  Difficulty concentrating or  making decisions? 0  Walking or climbing stairs? 0  Dressing or bathing? 0  Doing errands, shopping? 0  Preparing Food and eating ? N  Using the Toilet? N  In the past six months, have you accidently leaked urine? N  Do you have problems with loss of bowel control? N  Managing your Medications? N  Managing your Finances? N  Housekeeping or managing your Housekeeping? N    Patient Care Team: Venita Lick, NP as PCP - General (Nurse Practitioner) Idelle Leech, OD (Optometry) Corey Skains, MD as Consulting Physician (Cardiology) Samara Deist, DPM as Referring Physician (Podiatry)  Indicate any recent Medical Services you may have  received from other than [facility] providers in the past year (date may be approximate).     Assessment:   This is a routine wellness examination for Mikeala.  Hearing/Vision screen Hearing Screening - Comments:: No trouble hearing Vision Screening - Comments:: Dr. Matilde Sprang Up to date  Dietary issues and exercise activities discussed: Current Exercise Habits: Home exercise routine, Type of exercise: Other - see comments (online classes), Time (Minutes): 30   Goals Addressed             This Visit's Progress    DIET - INCREASE WATER INTAKE   On track    Recommend drinking at least 6-8 glasses of water a day      Patient Stated   On track    08/28/2020, wants to weigh 155 pounds     Patient Stated   On track    08/30/2021, wants to weigh 160 pounds     Weight (lb) < 200 lb (90.7 kg)         Depression Screen    09/01/2022    9:38 AM 04/05/2022   10:07 AM 08/30/2021    9:46 AM 08/28/2020    9:46 AM 02/26/2020   10:09 AM 08/08/2019   11:07 AM 02/12/2019   10:25 AM  PHQ 2/9 Scores  PHQ - 2 Score 0 0 0 0 0 0 0  PHQ- 9 Score 1 0     2    Fall Risk    09/01/2022    9:33 AM 04/05/2022   10:07 AM 08/30/2021    9:46 AM 08/28/2020    9:46 AM 02/26/2020   10:08 AM  Fall Risk   Falls in the past year? 0 0 0 0 0  Number falls in past yr: 0 0    0  Injury with Fall? 0 1   0  Risk for fall due to :  No Fall Risks Medication side effect Medication side effect   Follow up Falls evaluation completed;Education provided;Falls prevention discussed Falls evaluation completed Falls evaluation completed;Education provided;Falls prevention discussed Falls evaluation completed;Education provided;Falls prevention discussed Falls evaluation completed    FALL RISK PREVENTION PERTAINING TO THE HOME:  Any stairs in or around the home? Yes  If so, are there any without handrails? No  Home free of loose throw rugs in walkways, pet beds, electrical cords, etc? Yes  Adequate lighting in your home to reduce risk of falls? Yes   ASSISTIVE DEVICES UTILIZED TO PREVENT FALLS:  Life alert? No  Use of a cane, walker or w/c? No  Grab bars in the bathroom? Yes  Shower chair or bench in shower? Yes  Elevated toilet seat or a handicapped toilet? No   TIMED UP AND GO:  Was the test performed? No .    Cognitive Function:        09/01/2022    9:35 AM 08/30/2021    9:47 AM 08/28/2020    9:47 AM 08/03/2018   11:06 AM 07/26/2017    1:11 PM  6CIT Screen  What Year? 0 points 0 points 0 points 0 points 0 points  What month? 0 points 0 points 0 points 0 points 0 points  What time? 0 points 0 points 0 points 0 points 0 points  Count back from 20 0 points 0 points 0 points 0 points 0 points  Months in reverse 0 points 0 points 0 points 0 points 0 points  Repeat phrase 0 points 0 points 0 points 0 points 0 points  Total Score 0 points 0 points 0 points 0 points 0 points    Immunizations Immunization History  Administered Date(s) Administered   Fluad Quad(high Dose 65+) 09/30/2019   Influenza, High Dose Seasonal PF 09/17/2018, 09/25/2021   PFIZER Comirnaty(Gray Top)Covid-19 Tri-Sucrose Vaccine 03/20/2021   PFIZER(Purple Top)SARS-COV-2 Vaccination 01/06/2020, 01/27/2020, 09/14/2020, 08/28/2021   Pfizer Covid-19 Vaccine Bivalent Booster 72yr & up  08/28/2021   Pneumococcal Conjugate-13 04/02/2014   Pneumococcal Polysaccharide-23 04/13/2016   Td 04/14/2004   Zoster, Live 11/17/2008    TDAP status: Due, Education has been provided regarding the importance of this vaccine. Advised may receive this vaccine at local pharmacy or Health Dept. Aware to provide a copy of the vaccination record if obtained from local pharmacy or Health Dept. Verbalized acceptance and understanding.  Flu Vaccine status: Up to date  Pneumococcal vaccine status: Up to date  Covid-19 vaccine status: Information provided on how to obtain vaccines.   Qualifies for Shingles Vaccine? Yes   Zostavax completed Yes   Shingrix Completed?: No.    Education has been provided regarding the importance of this vaccine. Patient has been advised to call insurance company to determine out of pocket expense if they have not yet received this vaccine. Advised may also receive vaccine at local pharmacy or Health Dept. Verbalized acceptance and understanding.  Screening Tests Health Maintenance  Topic Date Due   INFLUENZA VACCINE  07/12/2022   COVID-19 Vaccine (7 - Pfizer series) 08/16/2022   Zoster Vaccines- Shingrix (1 of 2) 12/01/2022 (Originally 05/31/1997)   TETANUS/TDAP  09/02/2023 (Originally 04/14/2014)   COLONOSCOPY (Pts 45-451yrInsurance coverage will need to be confirmed)  10/02/2022   DEXA SCAN  06/02/2024   Pneumonia Vaccine 6536Years old  Completed   Hepatitis C Screening  Completed   HPV VACCINES  Aged Out    Health Maintenance  Health Maintenance Due  Topic Date Due   INFLUENZA VACCINE  07/12/2022   COVID-19 Vaccine (7 - Pfizer series) 08/16/2022    Colorectal cancer screening: No longer required.   Mammogram scheduled  Bone Density status: Completed 2020. Results reflect: Bone density results: OSTEOPENIA. Repeat every 5 years.  Lung Cancer Screening: (Low Dose CT Chest recommended if Age 75-80ears, 30 pack-year currently smoking OR have quit w/in  15years.) does not qualify.   Lung Cancer Screening Referral:   Additional Screening:  Hepatitis C Screening: does not qualify; Completed 2017  Vision Screening: Recommended annual ophthalmology exams for early detection of glaucoma and other disorders of the eye. Is the patient up to date with their annual eye exam?  Yes  Who is the provider or what is the name of the office in which the patient attends annual eye exams? [facility] If pt is not established with a provider, would they like to be referred to a provider to establish care? No .   Dental Screening: Recommended annual dental exams for proper oral hygiene  Community Resource Referral / Chronic Care Management: CRR required this visit?  No   CCM required this visit?  No      Plan:     I have personally reviewed and noted the following in the patient's chart:   Medical and social history Use of alcohol, tobacco or illicit drugs  Current medications and supplements including opioid prescriptions. Patient is not currently taking opioid prescriptions. Functional ability and status Nutritional status Physical activity Advanced directives List of other physicians Hospitalizations, surgeries, and ER visits in previous 12 months Vitals Screenings to include cognitive,  depression, and falls Referrals and appointments  In addition, I have reviewed and discussed with patient certain preventive protocols, quality metrics, and best practice recommendations. A written personalized care plan for preventive services as well as general preventive health recommendations were provided to patient.     Leroy Kennedy, LPN   4/78/2956   Nurse Notes: Patient is due for Colonoscopy this year, wanted to speak with MD if she really needs since she is 70.  Her last one was 2020 with 5 year return

## 2022-09-02 ENCOUNTER — Ambulatory Visit: Payer: Medicare Other

## 2022-09-07 ENCOUNTER — Ambulatory Visit
Admission: RE | Admit: 2022-09-07 | Discharge: 2022-09-07 | Disposition: A | Discharge status: Home / Self Care | Payer: Medicare Other | Source: Ambulatory Visit | Attending: Nurse Practitioner | Admitting: Nurse Practitioner

## 2022-09-07 DIAGNOSIS — Z1231 Encounter for screening mammogram for malignant neoplasm of breast: Secondary | ICD-10-CM | POA: Insufficient documentation

## 2022-09-07 NOTE — Progress Notes (Signed)
Contacted via MyChart   Normal mammogram, may repeat in one year:)

## 2022-09-21 DIAGNOSIS — Z23 Encounter for immunization: Secondary | ICD-10-CM | POA: Diagnosis not present

## 2022-10-02 NOTE — Patient Instructions (Signed)

## 2022-10-05 ENCOUNTER — Encounter: Payer: Self-pay | Admitting: Nurse Practitioner

## 2022-10-05 ENCOUNTER — Ambulatory Visit (INDEPENDENT_AMBULATORY_CARE_PROVIDER_SITE_OTHER): Payer: Medicare Other | Admitting: Nurse Practitioner

## 2022-10-05 VITALS — BP 123/78 | HR 77 | Temp 97.5°F | Ht 63.5 in | Wt 169.9 lb

## 2022-10-05 DIAGNOSIS — B000 Eczema herpeticum: Secondary | ICD-10-CM

## 2022-10-05 DIAGNOSIS — E559 Vitamin D deficiency, unspecified: Secondary | ICD-10-CM | POA: Diagnosis not present

## 2022-10-05 DIAGNOSIS — M85851 Other specified disorders of bone density and structure, right thigh: Secondary | ICD-10-CM | POA: Diagnosis not present

## 2022-10-05 DIAGNOSIS — E7800 Pure hypercholesterolemia, unspecified: Secondary | ICD-10-CM | POA: Diagnosis not present

## 2022-10-05 DIAGNOSIS — Z6829 Body mass index (BMI) 29.0-29.9, adult: Secondary | ICD-10-CM | POA: Diagnosis not present

## 2022-10-05 DIAGNOSIS — I10 Essential (primary) hypertension: Secondary | ICD-10-CM

## 2022-10-05 DIAGNOSIS — T466X5A Adverse effect of antihyperlipidemic and antiarteriosclerotic drugs, initial encounter: Secondary | ICD-10-CM

## 2022-10-05 DIAGNOSIS — I340 Nonrheumatic mitral (valve) insufficiency: Secondary | ICD-10-CM

## 2022-10-05 DIAGNOSIS — M791 Myalgia, unspecified site: Secondary | ICD-10-CM | POA: Diagnosis not present

## 2022-10-05 DIAGNOSIS — E6609 Other obesity due to excess calories: Secondary | ICD-10-CM

## 2022-10-05 LAB — MICROALBUMIN, URINE WAIVED
Creatinine, Urine Waived: 200 mg/dL (ref 10–300)
Microalb, Ur Waived: 80 mg/L — ABNORMAL HIGH (ref 0–19)

## 2022-10-05 MED ORDER — HYDROCORTISONE 2.5 % EX CREA: TOPICAL_CREAM | CUTANEOUS | 4 refills | Status: DC

## 2022-10-05 NOTE — Assessment & Plan Note (Signed)
Medication refills per request.

## 2022-10-05 NOTE — Assessment & Plan Note (Signed)
Chronic, stable.  BP well at goal in office and at home.  Continue daily BP checks at home and current medication regimen + collaboration with cardiology.  DASH diet focus.  CBC, CMP, TSH, urine ALB today.  Refills sent.  Return in 6 months for follow-up.

## 2022-10-05 NOTE — Assessment & Plan Note (Signed)
Chronic, ongoing with Repatha -- tolerates this well.  LDL last visit remains stable.  Praised for success and recommend continuing Repatha, will send refills as needed.  Return in 6 months. Lipid panel today.

## 2022-10-05 NOTE — Assessment & Plan Note (Addendum)
On DEXA 2020.  Recommend continued supplement daily, Vit D and Calcium + weight bearing exercises.  Repeat DEXA in 2025.  Check Vit D level today.

## 2022-10-05 NOTE — Assessment & Plan Note (Signed)
Noted on echo, continue to monitor.  No symptoms at this time.

## 2022-10-05 NOTE — Assessment & Plan Note (Signed)
Tolerating Repatha with improved LDL, continue this regimen and adjust as needed.

## 2022-10-05 NOTE — Assessment & Plan Note (Signed)
BMI 29.62 with some loss since last visit.  Recommended eating smaller high protein, low fat meals more frequently and exercising 30 mins a day 5 times a week with a goal of 10-15lb weight loss in the next 3 months. Patient voiced their understanding and motivation to adhere to these recommendations.

## 2022-10-05 NOTE — Assessment & Plan Note (Addendum)
Ongoing, stable.  Continue daily supplement.  Vitamin D level today.

## 2022-10-05 NOTE — Progress Notes (Signed)
BP 123/78   Pulse 77   Temp (!) 97.5 F (36.4 C) (Oral)   Ht 5' 3.5" (1.613 m)   Wt 169 lb 14.4 oz (77.1 kg)   LMP  (LMP Unknown)   SpO2 95%   BMI 29.62 kg/m    Subjective:    Patient ID: Janet Taylor, female    DOB: 12-Jun-1946, 76 y.o.   MRN: [id number]  HPI: Janet Taylor is a 75 y.o. female  Chief Complaint  Patient presents with   Hyperlipidemia   Hypertension   Osteopenia   Medication Refill    Patient is requesting a "Printed" prescription refill request on her Hydrocortisone cream to take to a local pharmacy, as her pharmacy does not always have it in stock.    HYPERTENSION / HYPERLIPIDEMIA Taking HCTZ, Metoprolol, and Repatha.  Saw cardiology in 08/16/22, no changes made.  Last echo 08/19/21 showed EF >55%.  Last LDL continued to trend down with Repatha on board -- poor tolerance of all statins in past. Satisfied with current treatment? yes Duration of hypertension: chronic BP monitoring frequency: twice a week BP range: 120/70 range BP medication side effects: no Duration of hyperlipidemia: chronic Cholesterol medication side effects: no Cholesterol supplements: fish oil Medication compliance: good compliance Aspirin: no Recent stressors: no Recurrent headaches: no Visual changes: no Palpitations: no Dyspnea: no Chest pain: no Lower extremity edema: no Dizzy/lightheaded: no   OSTEOPENIA Noted on DEXA in June 2020 with T Score -1.1.  Continues on Vitamin D supplement daily + protein powder. Satisfied with current treatment?: yes Adequate calcium & vitamin D: yes Weight bearing exercises: yes  Relevant past medical, surgical, family and social history reviewed and updated as indicated. Interim medical history since our last visit reviewed. Allergies and medications reviewed and updated.  Review of Systems  Constitutional:  Negative for activity change, appetite change, diaphoresis, fatigue and fever.  Respiratory:  Negative for cough, chest tightness  and shortness of breath.   Cardiovascular:  Negative for chest pain, palpitations and leg swelling.  Gastrointestinal: Negative.   Neurological: Negative.   Psychiatric/Behavioral: Negative.     Per HPI unless specifically indicated above     Objective:    BP 123/78   Pulse 77   Temp (!) 97.5 F (36.4 C) (Oral)   Ht 5' 3.5" (1.613 m)   Wt 169 lb 14.4 oz (77.1 kg)   LMP  (LMP Unknown)   SpO2 95%   BMI 29.62 kg/m   Wt Readings from Last 3 Encounters:  10/05/22 169 lb 14.4 oz (77.1 kg)  04/05/22 174 lb 12.8 oz (79.3 kg)  10/05/21 169 lb 3.2 oz (76.7 kg)    Physical Exam Vitals and nursing note reviewed.  Constitutional:      General: She is awake. She is not in acute distress.    Appearance: She is well-developed and overweight. She is not ill-appearing.  HENT:     Head: Normocephalic.     Right Ear: Hearing normal.     Left Ear: Hearing normal.  Eyes:     General: Lids are normal.        Right eye: No discharge.        Left eye: No discharge.     Conjunctiva/sclera: Conjunctivae normal.     Pupils: Pupils are equal, round, and reactive to light.  Neck:     Thyroid: No thyromegaly.     Vascular: No carotid bruit.  Cardiovascular:     Rate and Rhythm: Normal  rate and regular rhythm.     Heart sounds: Normal heart sounds. No murmur heard.    No gallop.  Pulmonary:     Effort: Pulmonary effort is normal. No accessory muscle usage or respiratory distress.     Breath sounds: Normal breath sounds.  Abdominal:     General: Bowel sounds are normal.     Palpations: Abdomen is soft. There is no hepatomegaly or splenomegaly.  Musculoskeletal:     Cervical back: Normal range of motion and neck supple.     Right lower leg: No edema.     Left lower leg: No edema.  Skin:    General: Skin is warm and dry.  Neurological:     Mental Status: She is alert and oriented to person, place, and time.  Psychiatric:        Attention and Perception: Attention normal.        Mood and  Affect: Mood normal.        Speech: Speech normal.        Behavior: Behavior normal. Behavior is cooperative.        Thought Content: Thought content normal.    Results for orders placed or performed in visit on 20/94/70  Basic metabolic panel  Result Value Ref Range   Glucose 94 70 - 99 mg/dL   BUN 8 8 - 27 mg/dL   Creatinine, Ser 0.68 0.57 - 1.00 mg/dL   eGFR 91 >59 mL/min/1.73   BUN/Creatinine Ratio 12 12 - 28   Sodium 142 134 - 144 mmol/L   Potassium 4.0 3.5 - 5.2 mmol/L   Chloride 104 96 - 106 mmol/L   CO2 25 20 - 29 mmol/L   Calcium 9.5 8.7 - 10.3 mg/dL  Lipid Panel w/o Chol/HDL Ratio  Result Value Ref Range   Cholesterol, Total 132 100 - 199 mg/dL   Triglycerides 84 0 - 149 mg/dL   HDL 68 >39 mg/dL   VLDL Cholesterol Cal 16 5 - 40 mg/dL   LDL Chol Calc (NIH) 48 0 - 99 mg/dL      Assessment & Plan:   Problem List Items Addressed This Visit       Cardiovascular and Mediastinum   Hypertension - Primary    Chronic, stable.  BP well at goal in office and at home.  Continue daily BP checks at home and current medication regimen + collaboration with cardiology.  DASH diet focus.  CBC, CMP, TSH, urine ALB today.  Refills sent.  Return in 6 months for follow-up.      Relevant Orders   CBC with Differential/Platelet   Comprehensive metabolic panel   TSH   Microalbumin, Urine Waived   Mild mitral regurgitation by prior echocardiogram    Noted on echo, continue to monitor.  No symptoms at this time.      Relevant Orders   Comprehensive metabolic panel   Lipid Panel w/o Chol/HDL Ratio     Musculoskeletal and Integument   Eczema herpeticum    Medication refills per request.      Relevant Medications   hydrocortisone 2.5 % cream   Osteopenia of neck of right femur    On DEXA 2020.  Recommend continued supplement daily, Vit D and Calcium + weight bearing exercises.  Repeat DEXA in 2025.  Check Vit D level today.      Relevant Orders   VITAMIN D 25 Hydroxy (Vit-D  Deficiency, Fractures)     Other   BMI 29.0-29.9,adult    BMI  29.62 with some loss since last visit.  Recommended eating smaller high protein, low fat meals more frequently and exercising 30 mins a day 5 times a week with a goal of 10-15lb weight loss in the next 3 months. Patient voiced their understanding and motivation to adhere to these recommendations.       Hyperlipidemia    Chronic, ongoing with Repatha -- tolerates this well.  LDL last visit remains stable.  Praised for success and recommend continuing Repatha, will send refills as needed.  Return in 6 months. Lipid panel today.      Relevant Orders   Comprehensive metabolic panel   Lipid Panel w/o Chol/HDL Ratio   Myalgia due to statin    Tolerating Repatha with improved LDL, continue this regimen and adjust as needed.      Relevant Orders   Comprehensive metabolic panel   Lipid Panel w/o Chol/HDL Ratio   Vitamin D deficiency    Ongoing, stable.  Continue daily supplement.  Vitamin D level today.      Relevant Orders   VITAMIN D 25 Hydroxy (Vit-D Deficiency, Fractures)     Follow up plan: Return in about 6 months (around 04/06/2023) for HTN/HLD, VIT D, OSTEOPENIA.

## 2022-10-06 LAB — LIPID PANEL W/O CHOL/HDL RATIO
Cholesterol, Total: 163 mg/dL (ref 100–199)
HDL: 70 mg/dL (ref >39)
LDL Chol Calc (NIH): 76 mg/dL (ref 0–99)
Triglycerides: 95 mg/dL (ref 0–149)
VLDL Cholesterol Cal: 17 mg/dL (ref 5–40)

## 2022-10-06 LAB — CBC WITH DIFFERENTIAL/PLATELET
Basophils Absolute: 0.1 10*3/uL (ref 0.0–0.2)
Basos: 1 %
EOS (ABSOLUTE): 0.5 10*3/uL — ABNORMAL HIGH (ref 0.0–0.4)
Eos: 8 %
Hematocrit: 47.2 % — ABNORMAL HIGH (ref 34.0–46.6)
Hemoglobin: 15.2 g/dL (ref 11.1–15.9)
Immature Grans (Abs): 0 10*3/uL (ref 0.0–0.1)
Immature Granulocytes: 0 %
Lymphocytes Absolute: 2 10*3/uL (ref 0.7–3.1)
Lymphs: 34 %
MCH: 30 pg (ref 26.6–33.0)
MCHC: 32.2 g/dL (ref 31.5–35.7)
MCV: 93 fL (ref 79–97)
Monocytes Absolute: 0.4 10*3/uL (ref 0.1–0.9)
Monocytes: 7 %
Neutrophils Absolute: 2.9 10*3/uL (ref 1.4–7.0)
Neutrophils: 50 %
Platelets: 329 10*3/uL (ref 150–450)
RBC: 5.07 x10E6/uL (ref 3.77–5.28)
RDW: 13.7 % (ref 11.7–15.4)
WBC: 6 10*3/uL (ref 3.4–10.8)

## 2022-10-06 LAB — COMPREHENSIVE METABOLIC PANEL
ALT: 23 IU/L (ref 0–32)
AST: 19 IU/L (ref 0–40)
Albumin/Globulin Ratio: 2.1 (ref 1.2–2.2)
Albumin: 4.6 g/dL (ref 3.8–4.8)
Alkaline Phosphatase: 67 IU/L (ref 44–121)
BUN/Creatinine Ratio: 13 (ref 12–28)
BUN: 10 mg/dL (ref 8–27)
Bilirubin Total: 0.3 mg/dL (ref 0.0–1.2)
CO2: 25 mmol/L (ref 20–29)
Calcium: 10.1 mg/dL (ref 8.7–10.3)
Chloride: 98 mmol/L (ref 96–106)
Creatinine, Ser: 0.78 mg/dL (ref 0.57–1.00)
Globulin, Total: 2.2 g/dL (ref 1.5–4.5)
Glucose: 97 mg/dL (ref 70–99)
Potassium: 3.8 mmol/L (ref 3.5–5.2)
Sodium: 139 mmol/L (ref 134–144)
Total Protein: 6.8 g/dL (ref 6.0–8.5)
eGFR: 79 mL/min/{1.73_m2} (ref >59)

## 2022-10-06 LAB — TSH: TSH: 0.978 u[IU]/mL (ref 0.450–4.500)

## 2022-10-06 LAB — VITAMIN D 25 HYDROXY (VIT D DEFICIENCY, FRACTURES): Vit D, 25-Hydroxy: 54 ng/mL (ref 30.0–100.0)

## 2022-10-06 NOTE — Progress Notes (Signed)
Contacted via [place] morning [place], your labs have returned.  Everything remains nice and stable.  No medication changes needed!!  Keep up the great work.   Keep being amazing!!  Thank you for allowing me to participate in your care.  I appreciate you. Kindest regards, Cruze Zingaro

## 2022-12-14 ENCOUNTER — Telehealth: Payer: Self-pay

## 2022-12-14 NOTE — Telephone Encounter (Signed)
PA started for Repatha through Covermy meds. Awaiting on determination

## 2023-01-03 DIAGNOSIS — H401232 Low-tension glaucoma, bilateral, moderate stage: Secondary | ICD-10-CM | POA: Diagnosis not present

## 2023-01-03 DIAGNOSIS — H52223 Regular astigmatism, bilateral: Secondary | ICD-10-CM | POA: Diagnosis not present

## 2023-01-03 DIAGNOSIS — Z961 Presence of intraocular lens: Secondary | ICD-10-CM | POA: Diagnosis not present

## 2023-01-03 DIAGNOSIS — H5203 Hypermetropia, bilateral: Secondary | ICD-10-CM | POA: Diagnosis not present

## 2023-01-03 DIAGNOSIS — H524 Presbyopia: Secondary | ICD-10-CM | POA: Diagnosis not present

## 2023-04-02 NOTE — Patient Instructions (Signed)
Be Involved in Your Health Care:  Taking Medications When medications are taken as directed, they can greatly improve your health. But if they are not taken as instructed, they may not work. In some cases, not taking them correctly can be harmful. To help ensure your treatment remains effective and safe, understand your medications and how to take them.  Your lab results, notes and after visit summary will be available on My Chart. We strongly encourage you to use this feature. If lab results are abnormal the clinic will contact you with the appropriate steps. If the clinic does not contact you assume the results are satisfactory. You can always see your results on My Chart. If you have questions regarding your condition, please contact the clinic during office hours. You can also ask questions on My Chart.  We at [facility] are grateful that you chose us to provide care. We strive to provide excellent and compassionate care and are always looking for feedback. If you get a survey from the clinic please complete this.    Preventing High Cholesterol Cholesterol is a white, waxy substance similar to fat that the human body needs to help build cells. The liver makes all the cholesterol that a person's body needs. Having high cholesterol (hypercholesterolemia) increases your risk for heart disease and stroke. Extra or excess cholesterol comes from the food that you eat. High cholesterol can often be prevented with diet and lifestyle changes. If you already have high cholesterol, you can control it with diet, lifestyle changes, and medicines. How can high cholesterol affect me? If you have high cholesterol, fatty deposits (plaques) may build up on the walls of your blood vessels. The blood vessels that carry blood away from your heart are called arteries. Plaques make the arteries narrower and stiffer. This in turn can: Restrict or block blood flow and cause blood clots to form. Increase  your risk for heart attack and stroke. What can increase my risk for high cholesterol? This condition is more likely to develop in people who: Eat foods that are high in saturated fat or cholesterol. Saturated fat is mostly found in foods that come from animal sources. Are overweight. Are not getting enough exercise. Use products that contain nicotine or tobacco, such as cigarettes, e-cigarettes, and chewing tobacco. Have a family history of high cholesterol (familial hypercholesterolemia). What actions can I take to prevent this? Nutrition  Eat less saturated fat. Avoid trans fats (partially hydrogenated oils). These are often found in margarine and in some baked goods, fried foods, and snacks bought in packages. Avoid precooked or cured meat, such as bacon, sausages, or meat loaves. Avoid foods and drinks that have added sugars. Eat more fruits, vegetables, and whole grains. Choose healthy sources of protein, such as fish, poultry, lean cuts of red meat, beans, peas, lentils, and nuts. Choose healthy sources of fat, such as: Nuts. Vegetable oils, especially olive oil. Fish that have healthy fats, such as omega-3 fatty acids. These fish include mackerel or salmon. Lifestyle Lose weight if you are overweight. Maintaining a healthy body mass index (BMI) can help prevent or control high cholesterol. It can also lower your risk for diabetes and high blood pressure. Ask your health care provider to help you with a diet and exercise plan to lose weight safely. Do not use any products that contain nicotine or tobacco. These products include cigarettes, chewing tobacco, and vaping devices, such as e-cigarettes. If you need help quitting, ask your health care provider. Alcohol   use Do not drink alcohol if: Your health care provider tells you not to drink. You are pregnant, may be pregnant, or are planning to become pregnant. If you drink alcohol: Limit how much you have to: 0-1 drink a day for  women. 0-2 drinks a day for men. Know how much alcohol is in your drink. In the [place] one drink equals one 12 oz bottle of beer (355 mL), one 5 oz glass of wine (148 mL), or one 1 oz glass of hard liquor (44 mL). Activity  Get enough exercise. Do exercises as told by your health care provider. Each week, do at least 150 minutes of exercise that takes a medium level of effort (moderate-intensity exercise). This kind of exercise: Makes your heart beat faster while allowing you to still be able to talk. Can be done in short sessions several times a day or longer sessions a few times a week. For example, on 5 days each week, you could walk fast or ride your bike 3 times a day for 10 minutes each time. Medicines Your health care provider may recommend medicines to help lower cholesterol. This may be a medicine to lower the amount of cholesterol that your liver makes. You may need medicine if: Diet and lifestyle changes have not lowered your cholesterol enough. You have high cholesterol and other risk factors for heart disease or stroke. Take over-the-counter and prescription medicines only as told by your health care provider. General information Manage your risk factors for high cholesterol. Talk with your health care provider about all your risk factors and how to lower your risk. Manage other conditions that you have, such as diabetes or high blood pressure (hypertension). Have blood tests to check your cholesterol levels at regular points in time as told by your health care provider. Keep all follow-up visits. This is important. Where to find more information American Heart Association: www.heart.org National Heart, Lung, and Blood Institute: www.nhlbi.nih.gov Summary High cholesterol increases your risk for heart disease and stroke. By keeping your cholesterol level low, you can reduce your risk for these conditions. High cholesterol can often be prevented with diet and lifestyle  changes. Work with your health care provider to manage your risk factors, and have your blood tested regularly. This information is not intended to replace advice given to you by your health care provider. Make sure you discuss any questions you have with your health care provider. Document Revised: 07/01/2022 Document Reviewed: 02/01/2021 Elsevier Patient Education  2023 Elsevier Inc.  

## 2023-04-06 ENCOUNTER — Encounter: Payer: Self-pay | Admitting: Nurse Practitioner

## 2023-04-06 ENCOUNTER — Ambulatory Visit (INDEPENDENT_AMBULATORY_CARE_PROVIDER_SITE_OTHER): Payer: Medicare Other | Admitting: Nurse Practitioner

## 2023-04-06 VITALS — BP 121/84 | HR 73 | Temp 97.6°F | Ht 63.5 in | Wt 168.7 lb

## 2023-04-06 DIAGNOSIS — Z6829 Body mass index (BMI) 29.0-29.9, adult: Secondary | ICD-10-CM

## 2023-04-06 DIAGNOSIS — E559 Vitamin D deficiency, unspecified: Secondary | ICD-10-CM

## 2023-04-06 DIAGNOSIS — I10 Essential (primary) hypertension: Secondary | ICD-10-CM

## 2023-04-06 DIAGNOSIS — M85851 Other specified disorders of bone density and structure, right thigh: Secondary | ICD-10-CM | POA: Diagnosis not present

## 2023-04-06 DIAGNOSIS — T466X5A Adverse effect of antihyperlipidemic and antiarteriosclerotic drugs, initial encounter: Secondary | ICD-10-CM

## 2023-04-06 DIAGNOSIS — I340 Nonrheumatic mitral (valve) insufficiency: Secondary | ICD-10-CM | POA: Diagnosis not present

## 2023-04-06 DIAGNOSIS — M791 Myalgia, unspecified site: Secondary | ICD-10-CM

## 2023-04-06 DIAGNOSIS — E7800 Pure hypercholesterolemia, unspecified: Secondary | ICD-10-CM

## 2023-04-06 MED ORDER — METOPROLOL TARTRATE 50 MG PO TABS: 50.0000 mg | ORAL_TABLET | Freq: Every day | ORAL | 4 refills | Status: DC

## 2023-04-06 MED ORDER — HYDROCHLOROTHIAZIDE 25 MG PO TABS: 25.0000 mg | ORAL_TABLET | Freq: Every day | ORAL | 4 refills | Status: DC

## 2023-04-06 MED ORDER — REPATHA 140 MG/ML ~~LOC~~ SOSY: 140.0000 mg | PREFILLED_SYRINGE | SUBCUTANEOUS | 4 refills | Status: DC

## 2023-04-06 NOTE — Assessment & Plan Note (Signed)
Tolerating Repatha with improved LDL, continue this regimen and adjust as needed. 

## 2023-04-06 NOTE — Progress Notes (Signed)
BP 121/84   Pulse 73   Temp 97.6 F (36.4 C) (Oral)   Ht 5' 3.5" (1.613 m)   Wt 168 lb 11.2 oz (76.5 kg)   LMP  (LMP Unknown)   SpO2 99%   BMI 29.41 kg/m    Subjective:    Patient ID: Janet Taylor, female    DOB: 23-May-1946, 77 y.o.   MRN: [id number]  HPI: Janet Taylor is a 77 y.o. female  Chief Complaint  Patient presents with   Hypertension   Hyperlipidemia   vit. D   Osteopenia   HYPERTENSION / HYPERLIPIDEMIA Taking HCTZ, Metoprolol, and Repatha.  Had visit with cardiology on 08/16/22, no changes made.  Last echo 08/19/21 showed EF >55%.  LDL continues to be stable with Repatha on board -- poor tolerance of all statins in past. Satisfied with current treatment? yes Duration of hypertension: chronic BP monitoring frequency: twice a week BP range: 130/70 range BP medication side effects: no Duration of hyperlipidemia: chronic Cholesterol medication side effects: no Cholesterol supplements: fish oil Medication compliance: good compliance Aspirin: no Recent stressors: no Recurrent headaches: no Visual changes: no Palpitations: no Dyspnea: no Chest pain: no Lower extremity edema: no Dizzy/lightheaded: no   OSTEOPENIA Noted on DEXA in June 2020 with T Score -1.1.  Continues on Vitamin D supplement daily. Satisfied with current treatment?: yes Adequate calcium & vitamin D: yes Weight bearing exercises: yes  Relevant past medical, surgical, family and social history reviewed and updated as indicated. Interim medical history since our last visit reviewed. Allergies and medications reviewed and updated.  Review of Systems  Constitutional:  Negative for activity change, appetite change, diaphoresis, fatigue and fever.  Respiratory:  Negative for cough, chest tightness and shortness of breath.   Cardiovascular:  Negative for chest pain, palpitations and leg swelling.  Gastrointestinal: Negative.   Neurological: Negative.   Psychiatric/Behavioral: Negative.      Per HPI unless specifically indicated above     Objective:    BP 121/84   Pulse 73   Temp 97.6 F (36.4 C) (Oral)   Ht 5' 3.5" (1.613 m)   Wt 168 lb 11.2 oz (76.5 kg)   LMP  (LMP Unknown)   SpO2 99%   BMI 29.41 kg/m   Wt Readings from Last 3 Encounters:  04/06/23 168 lb 11.2 oz (76.5 kg)  10/05/22 169 lb 14.4 oz (77.1 kg)  04/05/22 174 lb 12.8 oz (79.3 kg)    Physical Exam Vitals and nursing note reviewed.  Constitutional:      General: She is awake. She is not in acute distress.    Appearance: She is well-developed and overweight. She is not ill-appearing.  HENT:     Head: Normocephalic.     Right Ear: Hearing normal.     Left Ear: Hearing normal.  Eyes:     General: Lids are normal.        Right eye: No discharge.        Left eye: No discharge.     Conjunctiva/sclera: Conjunctivae normal.     Pupils: Pupils are equal, round, and reactive to light.  Neck:     Thyroid: No thyromegaly.     Vascular: No carotid bruit.  Cardiovascular:     Rate and Rhythm: Normal rate and regular rhythm.     Heart sounds: Normal heart sounds. No murmur heard.    No gallop.  Pulmonary:     Effort: Pulmonary effort is normal. No accessory muscle usage  or respiratory distress.     Breath sounds: Normal breath sounds.  Abdominal:     General: Bowel sounds are normal.     Palpations: Abdomen is soft. There is no hepatomegaly or splenomegaly.  Musculoskeletal:     Cervical back: Normal range of motion and neck supple.     Right lower leg: No edema.     Left lower leg: No edema.  Skin:    General: Skin is warm and dry.  Neurological:     Mental Status: She is alert and oriented to person, place, and time.  Psychiatric:        Attention and Perception: Attention normal.        Mood and Affect: Mood normal.        Speech: Speech normal.        Behavior: Behavior normal. Behavior is cooperative.        Thought Content: Thought content normal.    Results for orders placed or  performed in visit on 10/05/22  CBC with Differential/Platelet  Result Value Ref Range   WBC 6.0 3.4 - 10.8 x10E3/uL   RBC 5.07 3.77 - 5.28 x10E6/uL   Hemoglobin 15.2 11.1 - 15.9 g/dL   Hematocrit 96.0 (H) 45.4 - 46.6 %   MCV 93 79 - 97 fL   MCH 30.0 26.6 - 33.0 pg   MCHC 32.2 31.5 - 35.7 g/dL   RDW 09.8 11.9 - 14.7 %   Platelets 329 150 - 450 x10E3/uL   Neutrophils 50 Not Estab. %   Lymphs 34 Not Estab. %   Monocytes 7 Not Estab. %   Eos 8 Not Estab. %   Basos 1 Not Estab. %   Neutrophils Absolute 2.9 1.4 - 7.0 x10E3/uL   Lymphocytes Absolute 2.0 0.7 - 3.1 x10E3/uL   Monocytes Absolute 0.4 0.1 - 0.9 x10E3/uL   EOS (ABSOLUTE) 0.5 (H) 0.0 - 0.4 x10E3/uL   Basophils Absolute 0.1 0.0 - 0.2 x10E3/uL   Immature Granulocytes 0 Not Estab. %   Immature Grans (Abs) 0.0 0.0 - 0.1 x10E3/uL  Comprehensive metabolic panel  Result Value Ref Range   Glucose 97 70 - 99 mg/dL   BUN 10 8 - 27 mg/dL   Creatinine, Ser 8.29 0.57 - 1.00 mg/dL   eGFR 79 >56 OZ/HYQ/6.57   BUN/Creatinine Ratio 13 12 - 28   Sodium 139 134 - 144 mmol/L   Potassium 3.8 3.5 - 5.2 mmol/L   Chloride 98 96 - 106 mmol/L   CO2 25 20 - 29 mmol/L   Calcium 10.1 8.7 - 10.3 mg/dL   Total Protein 6.8 6.0 - 8.5 g/dL   Albumin 4.6 3.8 - 4.8 g/dL   Globulin, Total 2.2 1.5 - 4.5 g/dL   Albumin/Globulin Ratio 2.1 1.2 - 2.2   Bilirubin Total 0.3 0.0 - 1.2 mg/dL   Alkaline Phosphatase 67 44 - 121 IU/L   AST 19 0 - 40 IU/L   ALT 23 0 - 32 IU/L  TSH  Result Value Ref Range   TSH 0.978 0.450 - 4.500 uIU/mL  Lipid Panel w/o Chol/HDL Ratio  Result Value Ref Range   Cholesterol, Total 163 100 - 199 mg/dL   Triglycerides 95 0 - 149 mg/dL   HDL 70 >84 mg/dL   VLDL Cholesterol Cal 17 5 - 40 mg/dL   LDL Chol Calc (NIH) 76 0 - 99 mg/dL  VITAMIN D 25 Hydroxy (Vit-D Deficiency, Fractures)  Result Value Ref Range   Vit D, 25-Hydroxy 54.0  30.0 - 100.0 ng/mL  Microalbumin, Urine Waived  Result Value Ref Range   Microalb, Ur Waived 80  (H) 0 - 19 mg/L   Creatinine, Urine Waived 200 10 - 300 mg/dL   Microalb/Creat Ratio 30-300 (H) <30 mg/g      Assessment & Plan:   Problem List Items Addressed This Visit       Cardiovascular and Mediastinum   Hypertension - Primary    Chronic, stable.  BP well at goal in office and at home.  Continue daily BP checks at home and current medication regimen + collaboration with cardiology.  DASH diet focus.  LABS: CMP.  Urine ALB 80 October 2023, discussed with her we could consider adding a low dose ACE or ARB in future, but would monitor closely as to not cause hypotension.  Refills sent.  Return in 6 months.      Relevant Medications   metoprolol tartrate (LOPRESSOR) 50 MG tablet   hydrochlorothiazide (HYDRODIURIL) 25 MG tablet   Evolocumab (REPATHA) 140 MG/ML SOSY   Other Relevant Orders   Comprehensive metabolic panel   Mild mitral regurgitation by prior echocardiogram    Stable.  Noted on echo, continue to monitor.  No symptoms at this time.      Relevant Medications   metoprolol tartrate (LOPRESSOR) 50 MG tablet   hydrochlorothiazide (HYDRODIURIL) 25 MG tablet   Evolocumab (REPATHA) 140 MG/ML SOSY     Musculoskeletal and Integument   Osteopenia of neck of right femur    On DEXA 2020.  Recommend continued supplement daily, Vit D and Calcium + weight bearing exercises.  Repeat DEXA in 2025.  Check Vit D level at physical.        Other   BMI 29.0-29.9,adult    BMI 29.41 with some loss.  Recommended eating smaller high protein, low fat meals more frequently and exercising 30 mins a day 5 times a week with a goal of 10-15lb weight loss in the next 3 months. Patient voiced their understanding and motivation to adhere to these recommendations.       Hyperlipidemia    Chronic, ongoing with Repatha -- tolerates this well.  LDL last visit remains stable.  Praised for success and recommend continuing Repatha, will send refills as needed.  Return in 6 months. Lipid panel today.       Relevant Medications   metoprolol tartrate (LOPRESSOR) 50 MG tablet   hydrochlorothiazide (HYDRODIURIL) 25 MG tablet   Evolocumab (REPATHA) 140 MG/ML SOSY   Other Relevant Orders   Comprehensive metabolic panel   Lipid Panel w/o Chol/HDL Ratio   Myalgia due to statin    Tolerating Repatha with improved LDL, continue this regimen and adjust as needed.      Vitamin D deficiency    Ongoing, stable.  Continue daily supplement.  Vitamin D level at physical.        Follow up plan: Return in about 6 months (around 10/06/2023) for Annual Exam.

## 2023-04-06 NOTE — Assessment & Plan Note (Signed)
On DEXA 2020.  Recommend continued supplement daily, Vit D and Calcium + weight bearing exercises.  Repeat DEXA in 2025.  Check Vit D level at physical.

## 2023-04-06 NOTE — Assessment & Plan Note (Signed)
Stable.  Noted on echo, continue to monitor.  No symptoms at this time.

## 2023-04-06 NOTE — Assessment & Plan Note (Signed)
Chronic, stable.  BP well at goal in office and at home.  Continue daily BP checks at home and current medication regimen + collaboration with cardiology.  DASH diet focus.  LABS: CMP.  Urine ALB 80 October 2023, discussed with her we could consider adding a low dose ACE or ARB in future, but would monitor closely as to not cause hypotension.  Refills sent.  Return in 6 months.

## 2023-04-06 NOTE — Assessment & Plan Note (Addendum)
Ongoing, stable.  Continue daily supplement.  Vitamin D level at physical.

## 2023-04-06 NOTE — Assessment & Plan Note (Signed)
Chronic, ongoing with Repatha -- tolerates this well.  LDL last visit remains stable.  Praised for success and recommend continuing Repatha, will send refills as needed.  Return in 6 months. Lipid panel today. 

## 2023-04-06 NOTE — Assessment & Plan Note (Signed)
BMI 29.41 with some loss.  Recommended eating smaller high protein, low fat meals more frequently and exercising 30 mins a day 5 times a week with a goal of 10-15lb weight loss in the next 3 months. Patient voiced their understanding and motivation to adhere to these recommendations.

## 2023-04-07 LAB — COMPREHENSIVE METABOLIC PANEL
ALT: 21 IU/L (ref 0–32)
AST: 18 IU/L (ref 0–40)
Albumin/Globulin Ratio: 1.7 (ref 1.2–2.2)
Albumin: 4 g/dL (ref 3.8–4.8)
Alkaline Phosphatase: 60 IU/L (ref 44–121)
BUN/Creatinine Ratio: 15 (ref 12–28)
BUN: 12 mg/dL (ref 8–27)
Bilirubin Total: 0.2 mg/dL (ref 0.0–1.2)
CO2: 26 mmol/L (ref 20–29)
Calcium: 9.5 mg/dL (ref 8.7–10.3)
Chloride: 101 mmol/L (ref 96–106)
Creatinine, Ser: 0.81 mg/dL (ref 0.57–1.00)
Globulin, Total: 2.3 g/dL (ref 1.5–4.5)
Glucose: 94 mg/dL (ref 70–99)
Potassium: 3.6 mmol/L (ref 3.5–5.2)
Sodium: 140 mmol/L (ref 134–144)
Total Protein: 6.3 g/dL (ref 6.0–8.5)
eGFR: 76 mL/min/{1.73_m2} (ref >59)

## 2023-04-07 LAB — LIPID PANEL W/O CHOL/HDL RATIO
Cholesterol, Total: 165 mg/dL (ref 100–199)
HDL: 72 mg/dL (ref >39)
LDL Chol Calc (NIH): 77 mg/dL (ref 0–99)
Triglycerides: 84 mg/dL (ref 0–149)
VLDL Cholesterol Cal: 16 mg/dL (ref 5–40)

## 2023-04-07 NOTE — Progress Notes (Signed)
Contacted via MyChart   Good evening Dashanae, your labs have returned and continue to look fabulous.  No changes to medications needed.  Great job!!! Keep being amazing!!  Thank you for allowing me to participate in your care.  I appreciate you. Kindest regards, Diavian Furgason

## 2023-04-10 DIAGNOSIS — H5203 Hypermetropia, bilateral: Secondary | ICD-10-CM | POA: Diagnosis not present

## 2023-04-10 DIAGNOSIS — H26493 Other secondary cataract, bilateral: Secondary | ICD-10-CM | POA: Diagnosis not present

## 2023-04-10 DIAGNOSIS — Z961 Presence of intraocular lens: Secondary | ICD-10-CM | POA: Diagnosis not present

## 2023-04-10 DIAGNOSIS — H52223 Regular astigmatism, bilateral: Secondary | ICD-10-CM | POA: Diagnosis not present

## 2023-04-10 DIAGNOSIS — H401232 Low-tension glaucoma, bilateral, moderate stage: Secondary | ICD-10-CM | POA: Diagnosis not present

## 2023-04-10 DIAGNOSIS — H524 Presbyopia: Secondary | ICD-10-CM | POA: Diagnosis not present

## 2023-07-03 DIAGNOSIS — H26491 Other secondary cataract, right eye: Secondary | ICD-10-CM | POA: Diagnosis not present

## 2023-07-03 DIAGNOSIS — H401232 Low-tension glaucoma, bilateral, moderate stage: Secondary | ICD-10-CM | POA: Diagnosis not present

## 2023-07-03 DIAGNOSIS — H26492 Other secondary cataract, left eye: Secondary | ICD-10-CM | POA: Diagnosis not present

## 2023-07-03 DIAGNOSIS — H52223 Regular astigmatism, bilateral: Secondary | ICD-10-CM | POA: Diagnosis not present

## 2023-07-03 DIAGNOSIS — H524 Presbyopia: Secondary | ICD-10-CM | POA: Diagnosis not present

## 2023-07-03 DIAGNOSIS — H5203 Hypermetropia, bilateral: Secondary | ICD-10-CM | POA: Diagnosis not present

## 2023-07-03 DIAGNOSIS — Z961 Presence of intraocular lens: Secondary | ICD-10-CM | POA: Diagnosis not present

## 2023-08-16 ENCOUNTER — Other Ambulatory Visit: Payer: Self-pay | Admitting: Nurse Practitioner

## 2023-08-16 DIAGNOSIS — Z1231 Encounter for screening mammogram for malignant neoplasm of breast: Secondary | ICD-10-CM

## 2023-08-21 DIAGNOSIS — Z23 Encounter for immunization: Secondary | ICD-10-CM | POA: Diagnosis not present

## 2023-09-05 ENCOUNTER — Ambulatory Visit: Payer: Medicare Other | Admitting: Emergency Medicine

## 2023-09-05 VITALS — Ht 64.0 in | Wt 168.0 lb

## 2023-09-05 DIAGNOSIS — Z0000 Encounter for general adult medical examination without abnormal findings: Secondary | ICD-10-CM

## 2023-09-05 NOTE — Patient Instructions (Addendum)
Janet Taylor , Thank you for taking time to come for your Medicare Wellness Visit. I appreciate your ongoing commitment to your health goals. Please review the following plan we discussed and let me know if I can assist you in the future.   Referrals/Orders/Follow-Ups/Clinician Recommendations: Get the flu and tetanus shots at your earliest convenience. Discuss the shingles vaccine with Jolene at your next OV on 10/04/23  This is a list of the screening recommended for you and due dates:  Health Maintenance  Topic Date Due   Zoster (Shingles) Vaccine (1 of 2) 05/31/1997   Flu Shot  03/11/2024*   Mammogram  09/08/2023   DEXA scan (bone density measurement)  06/02/2024   Medicare Annual Wellness Visit  09/04/2024   Pneumonia Vaccine  Completed   COVID-19 Vaccine  Completed   Hepatitis C Screening  Completed   HPV Vaccine  Aged Out   DTaP/Tdap/Td vaccine  Discontinued   Colon Cancer Screening  Discontinued  *Topic was postponed. The date shown is not the original due date.    Advanced directives: (Copy Requested) Please bring a copy of your health care power of attorney and living will to the office to be added to your chart at your convenience.  Next Medicare Annual Wellness Visit scheduled for next year: Yes, 09/10/24 @ 9am

## 2023-09-05 NOTE — Progress Notes (Signed)
Subjective:   Janet Taylor is a 77 y.o. female who presents for Medicare Annual (Subsequent) preventive examination.  Visit Complete: Virtual  I connected with  Janet Taylor on 09/05/23 by a audio enabled telemedicine application and verified that I am speaking with the correct person using two identifiers.  Patient Location: Home  Provider Location: Office/Clinic  I discussed the limitations of evaluation and management by telemedicine. The patient expressed understanding and agreed to proceed.  Vital Signs: Because this visit was a virtual/telehealth visit, some criteria may be missing or patient reported. Any vitals not documented were not able to be obtained and vitals that have been documented are patient reported.    Cardiac Risk Factors include: advanced age (>32men, >28 women);dyslipidemia;hypertension     Objective:    Today's Vitals   09/05/23 0946  Weight: 168 lb (76.2 kg)  Height: 5\' 4"  (1.626 m)   Body mass index is 28.84 kg/m.     09/05/2023    9:57 AM 09/01/2022    9:33 AM 08/30/2021    9:45 AM 08/28/2020    9:45 AM 08/08/2019   11:08 AM 08/03/2018   11:03 AM 06/11/2018   12:11 PM  Advanced Directives  Does Patient Have a Medical Advance Directive? Yes Yes Yes Yes No Yes No  Type of Estate agent of [place];Living will Healthcare Power of [employer] of [place];Living will Healthcare Power of [place];Living will  Healthcare Power of [place];Living will   Does patient want to make changes to medical advance directive? No - Patient declined        Copy of Healthcare Power of Attorney in Chart? No - copy requested No - copy requested No - copy requested No - copy requested  No - copy requested   Would patient like information on creating a medical advance directive?       No - Patient declined    Current Medications (verified) Outpatient Encounter Medications as of 09/05/2023  Medication Sig   Cod Liver Oil OIL Take 1  Dose by mouth daily.   Evolocumab (REPATHA) 140 MG/ML SOSY Inject 140 mg into the skin every 14 (fourteen) days.   hydrochlorothiazide (HYDRODIURIL) 25 MG tablet Take 1 tablet (25 mg total) by mouth daily.   hydrocortisone 2.5 % cream APPLY TO AFFECTED AREA TWICE A DAY   ibuprofen (ADVIL) 200 MG tablet Take 200 mg by mouth every 6 (six) hours as needed.   latanoprost (XALATAN) 0.005 % ophthalmic solution 1 drop at bedtime.   metoprolol tartrate (LOPRESSOR) 50 MG tablet Take 1 tablet (50 mg total) by mouth daily.   cholecalciferol (VITAMIN D3) 25 MCG (1000 UNIT) tablet Take 1,000 Units by mouth daily. (Patient not taking: Reported on 09/05/2023)   No facility-administered encounter medications on file as of 09/05/2023.    Allergies (verified) Hydrocodone, Lipitor [atorvastatin], Morphine and codeine, Rosuvastatin, and Zetia [ezetimibe]   History: Past Medical History:  Diagnosis Date   Eczema herpeticum    Hyperlipidemia    Hypertension    Menopausal disorder    Osteopenia    Past Surgical History:  Procedure Laterality Date   ABDOMINAL HYSTERECTOMY     complete   BREAST BIOPSY Right 2012   NEG   BREAST BIOPSY Right 09/16/2020   stereo bx of calcs, x marker, path pending   CATARACT EXTRACTION Right 10/03/2016   cataract surgery Left    09/12/16   COLONOSCOPY WITH PROPOFOL N/A 10/02/2017   Procedure: COLONOSCOPY WITH PROPOFOL;  Surgeon: Wyline Mood, MD;  Location: [facility] ENDOSCOPY;  Service: Gastroenterology;  Laterality: N/A;   EYE SURGERY Bilateral 09/12/2016   bilateral cataract surgery    EYE SURGERY  10/03/2016   Family History  Problem Relation Age of Onset   Alzheimer's disease Mother    Hypertension Father    Hypertension Sister    Breast cancer Neg Hx    Social History   Socioeconomic History   Marital status: Divorced    Spouse name: Not on file   Number of children: 0   Years of education: Not on file   Highest education level: Bachelor's degree (e.g., BA,  AB, BS)  Occupational History   Occupation: retired    Comment: Scientist, water quality  Tobacco Use   Smoking status: Never   Smokeless tobacco: Never  Vaping Use   Vaping status: Never Used  Substance and Sexual Activity   Alcohol use: Yes    Alcohol/week: 3.0 standard drinks of alcohol    Types: 3 Glasses of wine per week    Comment: on weekends   Drug use: No   Sexual activity: Not Currently  Other Topics Concern   Not on file  Social History Narrative   Walks outside for 30 minutes 3 days a week, works outside in yard   Social Determinants of Corporate investment banker Strain: Low Risk  (09/05/2023)   Overall Financial Resource Strain (CARDIA)    Difficulty of Paying Living Expenses: Not hard at all  Food Insecurity: No Food Insecurity (09/05/2023)   Hunger Vital Sign    Worried About Running Out of Food in the Last Year: Never true    Ran Out of Food in the Last Year: Never true  Transportation Needs: No Transportation Needs (09/05/2023)   PRAPARE - Administrator, Civil Service (Medical): No    Lack of Transportation (Non-Medical): No  Physical Activity: Insufficiently Active (09/05/2023)   Exercise Vital Sign    Days of Exercise per Week: 3 days    Minutes of Exercise per Session: 30 min  Stress: No Stress Concern Present (09/05/2023)   [employer] of Occupational Health - Occupational Stress Questionnaire    Feeling of Stress : Not at all  Social Connections: Moderately Integrated (09/05/2023)   Social Connection and Isolation Panel [NHANES]    Frequency of Communication with Friends and Family: More than three times a week    Frequency of Social Gatherings with Friends and Family: Three times a week    Attends Religious Services: More than 4 times per year    Active Member of Clubs or Organizations: Yes    Attends Engineer, structural: More than 4 times per year    Marital Status: Divorced    Tobacco Counseling Counseling given: Not  Answered   Clinical Intake:  Pre-visit preparation completed: Yes  Pain : No/denies pain     BMI - recorded: 28.84 Nutritional Status: BMI 25 -29 Overweight Nutritional Risks: None Diabetes: No  How often do you need to have someone help you when you read instructions, pamphlets, or other written materials from your doctor or pharmacy?: 1 - Never  Interpreter Needed?: No  Information entered by :: Tora Kindred, CMA   Activities of Daily Living    09/05/2023    9:48 AM  In your present state of health, do you have any difficulty performing the following activities:  Hearing? 0  Vision? 0  Difficulty concentrating or making decisions? 0  Walking or climbing  stairs? 0  Dressing or bathing? 0  Doing errands, shopping? 0  Preparing Food and eating ? N  Using the Toilet? N  In the past six months, have you accidently leaked urine? N  Do you have problems with loss of bowel control? N  Managing your Medications? N  Managing your Finances? N  Housekeeping or managing your Housekeeping? N    Patient Care Team: Marjie Skiff, NP as PCP - General (Nurse Practitioner) Domingo Madeira, OD (Optometry) Lamar Blinks, MD as Consulting Physician (Cardiology) Gwyneth Revels, DPM as Referring Physician (Podiatry)  Indicate any recent Medical Services you may have received from other than [facility] providers in the past year (date may be approximate).     Assessment:   This is a routine wellness examination for [place].  Hearing/Vision screen Hearing Screening - Comments:: Denies hearing loss Vision Screening - Comments:: Gets routine eye exams   Goals Addressed               This Visit's Progress     Patient Stated (pt-stated)        Lose 10 lbs      Depression Screen    09/05/2023    9:55 AM 10/05/2022   10:05 AM 09/01/2022    9:38 AM 04/05/2022   10:07 AM 08/30/2021    9:46 AM 08/28/2020    9:46 AM 02/26/2020   10:09 AM  PHQ 2/9 Scores  PHQ - 2 Score 0 0 0 0  0 0 0  PHQ- 9 Score 0 0 1 0       Fall Risk    09/05/2023    9:58 AM 10/05/2022   10:05 AM 09/01/2022    9:33 AM 04/05/2022   10:07 AM 08/30/2021    9:46 AM  Fall Risk   Falls in the past year? 0 0 0 0 0  Number falls in past yr: 0 0 0 0   Injury with Fall? 0 0 0 1   Risk for fall due to : No Fall Risks No Fall Risks  No Fall Risks Medication side effect  Follow up Falls prevention discussed Falls evaluation completed Falls evaluation completed;Education provided;Falls prevention discussed Falls evaluation completed Falls evaluation completed;Education provided;Falls prevention discussed    MEDICARE RISK AT HOME: Medicare Risk at Home Any stairs in or around the home?: Yes If so, are there any without handrails?: No Home free of loose throw rugs in walkways, pet beds, electrical cords, etc?: Yes Adequate lighting in your home to reduce risk of falls?: Yes Life alert?: No Use of a cane, walker or w/c?: No Grab bars in the bathroom?: Yes Shower chair or bench in shower?: Yes Elevated toilet seat or a handicapped toilet?: No  TIMED UP AND GO:  Was the test performed?  No    Cognitive Function:        09/05/2023    9:58 AM 09/01/2022    9:35 AM 08/30/2021    9:47 AM 08/28/2020    9:47 AM 08/03/2018   11:06 AM  6CIT Screen  What Year? 0 points 0 points 0 points 0 points 0 points  What month? 0 points 0 points 0 points 0 points 0 points  What time? 0 points 0 points 0 points 0 points 0 points  Count back from 20 0 points 0 points 0 points 0 points 0 points  Months in reverse 0 points 0 points 0 points 0 points 0 points  Repeat phrase 0 points 0  points 0 points 0 points 0 points  Total Score 0 points 0 points 0 points 0 points 0 points    Immunizations Immunization History  Administered Date(s) Administered   Fluad Quad(high Dose 65+) 09/30/2019, 09/21/2022   Influenza, High Dose Seasonal PF 09/17/2018, 09/25/2021   PFIZER Comirnaty(Gray Top)Covid-19 Tri-Sucrose Vaccine  03/20/2021   PFIZER(Purple Top)SARS-COV-2 Vaccination 01/06/2020, 01/27/2020, 09/14/2020, 08/28/2021   Pfizer Covid-19 Vaccine Bivalent Booster 106yrs & up 08/28/2021, 04/15/2022   Pfizer(Comirnaty)Fall Seasonal Vaccine 12 years and older 09/01/2022, 08/21/2023   Pneumococcal Conjugate-13 04/02/2014   Pneumococcal Polysaccharide-23 04/13/2016   Td 04/14/2004   Zoster, Live 11/17/2008    TDAP status: Due, Education has been provided regarding the importance of this vaccine. Advised may receive this vaccine at local pharmacy or Health Dept. Aware to provide a copy of the vaccination record if obtained from local pharmacy or Health Dept. Verbalized acceptance and understanding.  Flu Vaccine status: Due, Education has been provided regarding the importance of this vaccine. Advised may receive this vaccine at local pharmacy or Health Dept. Aware to provide a copy of the vaccination record if obtained from local pharmacy or Health Dept. Verbalized acceptance and understanding.  Pneumococcal vaccine status: Up to date  Covid-19 vaccine status: Completed vaccines  Qualifies for Shingles Vaccine? Yes   Zostavax completed Yes   Shingrix Completed?: No.    Education has been provided regarding the importance of this vaccine. Patient has been advised to call insurance company to determine out of pocket expense if they have not yet received this vaccine. Advised may also receive vaccine at local pharmacy or Health Dept. Verbalized acceptance and understanding.  Screening Tests Health Maintenance  Topic Date Due   Zoster Vaccines- Shingrix (1 of 2) 05/31/1997   INFLUENZA VACCINE  07/13/2023   DEXA SCAN  06/02/2024   Medicare Annual Wellness (AWV)  09/04/2024   Pneumonia Vaccine 66+ Years old  Completed   COVID-19 Vaccine  Completed   Hepatitis C Screening  Completed   HPV VACCINES  Aged Out   DTaP/Tdap/Td  Discontinued   Colonoscopy  Discontinued    Health Maintenance  Health Maintenance Due   Topic Date Due   Zoster Vaccines- Shingrix (1 of 2) 05/31/1997   INFLUENZA VACCINE  07/13/2023    Colorectal cancer screening: No longer required.   Mammogram status: Completed 09/07/22. Repeat every year. Scheduled 09/12/23  Bone Density status: Completed 06/03/19. Results reflect: Bone density results: OSTEOPENIA. Repeat every 5 years.  Lung Cancer Screening: (Low Dose CT Chest recommended if Age 4-80 years, 20 pack-year currently smoking OR have quit w/in 15years.) does not qualify.   Lung Cancer Screening Referral: n/a  Additional Screening:  Hepatitis C Screening: does not qualify; Completed 04/13/2016  Vision Screening: Recommended annual ophthalmology exams for early detection of glaucoma and other disorders of the eye.  Dental Screening: Recommended annual dental exams for proper oral hygiene  Community Resource Referral / Chronic Care Management: CRR required this visit?  No   CCM required this visit?  No     Plan:     I have personally reviewed and noted the following in the patient's chart:   Medical and social history Use of alcohol, tobacco or illicit drugs  Current medications and supplements including opioid prescriptions. Patient is not currently taking opioid prescriptions. Functional ability and status Nutritional status Physical activity Advanced directives List of other physicians Hospitalizations, surgeries, and ER visits in previous 12 months Vitals Screenings to include cognitive, depression, and falls Referrals and  appointments  In addition, I have reviewed and discussed with patient certain preventive protocols, quality metrics, and best practice recommendations. A written personalized care plan for preventive services as well as general preventive health recommendations were provided to patient.     Tora Kindred, CMA   09/05/2023   After Visit Summary: (MyChart) Due to this being a telephonic visit, the after visit summary with patients  personalized plan was offered to patient via MyChart   Nurse Notes:  Will get Tdap and flu vaccines. Wants to discuss Shingrix vaccine at next OV on 10/04/23.

## 2023-09-12 ENCOUNTER — Ambulatory Visit
Admission: RE | Admit: 2023-09-12 | Discharge: 2023-09-12 | Disposition: A | Discharge status: Home / Self Care | Payer: Medicare Other | Source: Ambulatory Visit | Attending: Nurse Practitioner | Admitting: Nurse Practitioner

## 2023-09-12 DIAGNOSIS — Z1231 Encounter for screening mammogram for malignant neoplasm of breast: Secondary | ICD-10-CM | POA: Insufficient documentation

## 2023-09-13 NOTE — Progress Notes (Signed)
Contacted via MyChart   Normal mammogram, may repeat in one year:)

## 2023-09-14 DIAGNOSIS — Z23 Encounter for immunization: Secondary | ICD-10-CM | POA: Diagnosis not present

## 2023-10-01 NOTE — Patient Instructions (Signed)
Wegovy or Zepbound  Focus on DASH diet for high blood pressure or Mediterranean diet  Be Involved in Caring For Your Health:  Taking Medications When medications are taken as directed, they can greatly improve your health. But if they are not taken as prescribed, they may not work. In some cases, not taking them correctly can be harmful. To help ensure your treatment remains effective and safe, understand your medications and how to take them. Bring your medications to each visit for review by your provider.  Your lab results, notes, and after visit summary will be available on My Chart. We strongly encourage you to use this feature. If lab results are abnormal the clinic will contact you with the appropriate steps. If the clinic does not contact you assume the results are satisfactory. You can always view your results on My Chart. If you have questions regarding your health or results, please contact the clinic during office hours. You can also ask questions on My Chart.  We at [facility] are grateful that you chose us to provide your care. We strive to provide evidence-based and compassionate care and are always looking for feedback. If you get a survey from the clinic please complete this so we can hear your opinions.  Preventing High Cholesterol Cholesterol is a white, waxy substance similar to fat that the human body needs to help build cells. The liver makes all the cholesterol that a person's body needs. Having high cholesterol (hypercholesterolemia) increases your risk for heart disease and stroke. Extra or excess cholesterol comes from the food that you eat. High cholesterol can often be prevented with diet and lifestyle changes. If you already have high cholesterol, you can control it with diet, lifestyle changes, and medicines. How can high cholesterol affect me? If you have high cholesterol, fatty deposits (plaques) may build up on the walls of your blood vessels. The blood  vessels that carry blood away from your heart are called arteries. Plaques make the arteries narrower and stiffer. This in turn can: Restrict or block blood flow and cause blood clots to form. Increase your risk for heart attack and stroke. What can increase my risk for high cholesterol? This condition is more likely to develop in people who: Eat foods that are high in saturated fat or cholesterol. Saturated fat is mostly found in foods that come from animal sources. Are overweight. Are not getting enough exercise. Use products that contain nicotine or tobacco, such as cigarettes, e-cigarettes, and chewing tobacco. Have a family history of high cholesterol (familial hypercholesterolemia). What actions can I take to prevent this? Nutrition  Eat less saturated fat. Avoid trans fats (partially hydrogenated oils). These are often found in margarine and in some baked goods, fried foods, and snacks bought in packages. Avoid precooked or cured meat, such as bacon, sausages, or meat loaves. Avoid foods and drinks that have added sugars. Eat more fruits, vegetables, and whole grains. Choose healthy sources of protein, such as fish, poultry, lean cuts of red meat, beans, peas, lentils, and nuts. Choose healthy sources of fat, such as: Nuts. Vegetable oils, especially olive oil. Fish that have healthy fats, such as omega-3 fatty acids. These fish include mackerel or salmon. Lifestyle Lose weight if you are overweight. Maintaining a healthy body mass index (BMI) can help prevent or control high cholesterol. It can also lower your risk for diabetes and high blood pressure. Ask your health care provider to help you with a diet and exercise plan to lose   weight safely. Do not use any products that contain nicotine or tobacco. These products include cigarettes, chewing tobacco, and vaping devices, such as e-cigarettes. If you need help quitting, ask your health care provider. Alcohol use Do not drink  alcohol if: Your health care provider tells you not to drink. You are pregnant, may be pregnant, or are planning to become pregnant. If you drink alcohol: Limit how much you have to: 0-1 drink a day for women. 0-2 drinks a day for men. Know how much alcohol is in your drink. In the [place] one drink equals one 12 oz bottle of beer (355 mL), one 5 oz glass of wine (148 mL), or one 1 oz glass of hard liquor (44 mL). Activity  Get enough exercise. Do exercises as told by your health care provider. Each week, do at least 150 minutes of exercise that takes a medium level of effort (moderate-intensity exercise). This kind of exercise: Makes your heart beat faster while allowing you to still be able to talk. Can be done in short sessions several times a day or longer sessions a few times a week. For example, on 5 days each week, you could walk fast or ride your bike 3 times a day for 10 minutes each time. Medicines Your health care provider may recommend medicines to help lower cholesterol. This may be a medicine to lower the amount of cholesterol that your liver makes. You may need medicine if: Diet and lifestyle changes have not lowered your cholesterol enough. You have high cholesterol and other risk factors for heart disease or stroke. Take over-the-counter and prescription medicines only as told by your health care provider. General information Manage your risk factors for high cholesterol. Talk with your health care provider about all your risk factors and how to lower your risk. Manage other conditions that you have, such as diabetes or high blood pressure (hypertension). Have blood tests to check your cholesterol levels at regular points in time as told by your health care provider. Keep all follow-up visits. This is important. Where to find more information American Heart Association: www.heart.org National Heart, Lung, and Blood Institute: www.nhlbi.nih.gov Summary High cholesterol  increases your risk for heart disease and stroke. By keeping your cholesterol level low, you can reduce your risk for these conditions. High cholesterol can often be prevented with diet and lifestyle changes. Work with your health care provider to manage your risk factors, and have your blood tested regularly. This information is not intended to replace advice given to you by your health care provider. Make sure you discuss any questions you have with your health care provider. Document Revised: 07/01/2022 Document Reviewed: 02/01/2021 Elsevier Patient Education  2024 Elsevier Inc.  

## 2023-10-04 ENCOUNTER — Ambulatory Visit (INDEPENDENT_AMBULATORY_CARE_PROVIDER_SITE_OTHER): Payer: Medicare Other | Admitting: Nurse Practitioner

## 2023-10-04 ENCOUNTER — Encounter: Payer: Self-pay | Admitting: Nurse Practitioner

## 2023-10-04 VITALS — BP 108/68 | HR 72 | Temp 97.9°F | Ht 63.7 in | Wt 171.0 lb

## 2023-10-04 DIAGNOSIS — E559 Vitamin D deficiency, unspecified: Secondary | ICD-10-CM

## 2023-10-04 DIAGNOSIS — Z6829 Body mass index (BMI) 29.0-29.9, adult: Secondary | ICD-10-CM | POA: Diagnosis not present

## 2023-10-04 DIAGNOSIS — T466X5A Adverse effect of antihyperlipidemic and antiarteriosclerotic drugs, initial encounter: Secondary | ICD-10-CM | POA: Diagnosis not present

## 2023-10-04 DIAGNOSIS — Z0000 Encounter for general adult medical examination without abnormal findings: Secondary | ICD-10-CM

## 2023-10-04 DIAGNOSIS — M791 Myalgia, unspecified site: Secondary | ICD-10-CM

## 2023-10-04 DIAGNOSIS — E7800 Pure hypercholesterolemia, unspecified: Secondary | ICD-10-CM | POA: Diagnosis not present

## 2023-10-04 DIAGNOSIS — I10 Essential (primary) hypertension: Secondary | ICD-10-CM

## 2023-10-04 DIAGNOSIS — I340 Nonrheumatic mitral (valve) insufficiency: Secondary | ICD-10-CM

## 2023-10-04 DIAGNOSIS — M85851 Other specified disorders of bone density and structure, right thigh: Secondary | ICD-10-CM | POA: Diagnosis not present

## 2023-10-04 NOTE — Assessment & Plan Note (Signed)
BMI 29.63  Recommended eating smaller high protein, low fat meals more frequently and exercising 30 mins a day 5 times a week with a goal of 10-15lb weight loss in the next 3 months. Patient voiced their understanding and motivation to adhere to these recommendations.  

## 2023-10-04 NOTE — Assessment & Plan Note (Signed)
On DEXA 2020.  Recommend continued supplement daily, Vit D and calcium + weight bearing exercises.  Repeat DEXA in 2025.  Check Vit D level today.

## 2023-10-04 NOTE — Assessment & Plan Note (Signed)
Ongoing, stable.  Continue daily supplement.  Vitamin D level today.

## 2023-10-04 NOTE — Assessment & Plan Note (Signed)
Chronic, ongoing with Repatha -- tolerates this well.  LDL last visit remains stable.  Praised for success and recommend continuing Repatha, will send refills as needed.  Return in 6 months. Lipid panel today. Could consider addition of Zetia if LDL needs tighter control.

## 2023-10-04 NOTE — Assessment & Plan Note (Signed)
Tolerating Repatha with improved LDL, continue this regimen and adjust as needed. 

## 2023-10-04 NOTE — Assessment & Plan Note (Signed)
Chronic, stable.  BP well below goal in office and at home.  Continue daily BP checks at home and current medication regimen + collaboration with cardiology.  DASH diet focus.  LABS: CMP, CBC, TSH.  Urine ALB 80 October 2023, discussed with her we could consider adding a low dose ACE or ARB in future, but would monitor closely as to not cause hypotension -- recheck urine next visit.  Refills up to date.  Return in 6 months.

## 2023-10-04 NOTE — Assessment & Plan Note (Signed)
Stable.  Noted on echo, continue to monitor.  No symptoms at this time.

## 2023-10-04 NOTE — Progress Notes (Signed)
BP 108/68   Pulse 72   Temp 97.9 F (36.6 C) (Oral)   Ht 5' 3.7" (1.618 m)   Wt 171 lb (77.6 kg)   LMP  (LMP Unknown)   SpO2 97%   BMI 29.63 kg/m    Subjective:    Patient ID: Janet Taylor, female    DOB: 16-Dec-1945, 77 y.o.   MRN: [id number]  HPI: Janet Taylor is a 77 y.o. female presenting on 10/04/2023 for comprehensive medical examination. Current medical complaints include:none  She currently lives with: self Menopausal Symptoms: no  HYPERTENSION / HYPERLIPIDEMIA Taking HCTZ, Metoprolol, and Repatha.  Last visit with cardiology in 08/16/22, no changes made.  Last echo 08/19/21 showed EF >55%.  Repatha has been offering benefit to LDL. Duration of hypertension: chronic BP monitoring frequency: twice a week BP range: 100-110/70 range BP medication side effects: no Duration of hyperlipidemia: chronic Cholesterol medication side effects: no Cholesterol supplements: fish oil Medication compliance: good compliance Aspirin: no Recent stressors: no Recurrent headaches: no Visual changes: no Palpitations: no Dyspnea: no Chest pain: no Lower extremity edema: no Dizzy/lightheaded: no   OSTEOPENIA Noted on DEXA in June 2020 with T Score -1.1.  Taking Cod Liver Oil.   Satisfied with current treatment?: yes Adequate calcium & vitamin D: yes Weight bearing exercises: yes  Depression Screen done today and results listed below:     10/04/2023    9:43 AM 09/05/2023    9:55 AM 10/05/2022   10:05 AM 09/01/2022    9:38 AM 04/05/2022   10:07 AM  Depression screen PHQ 2/9  Decreased Interest 0 0 0 0 0  Down, Depressed, Hopeless 0 0 0 0 0  PHQ - 2 Score 0 0 0 0 0  Altered sleeping 0 0 0 0 0  Tired, decreased energy 0 0 0 1 0  Change in appetite 0 0 0 0 0  Feeling bad or failure about yourself  0 0 0 0 0  Trouble concentrating 0 0 0 0 0  Moving slowly or fidgety/restless 0 0 0 0 0  Suicidal thoughts 0 0 0 0 0  PHQ-9 Score 0 0 0 1 0  Difficult doing work/chores Not  difficult at all Not difficult at all Not difficult at all Not difficult at all Not difficult at all      10/04/2023    9:43 AM 10/05/2022   10:05 AM 04/05/2022   10:07 AM 02/12/2019   10:25 AM  GAD 7 : Generalized Anxiety Score  Nervous, Anxious, on Edge 0 0 0 0  Control/stop worrying 0 0 0 0  Worry too much - different things 0 0 0 0  Trouble relaxing 0 0 0 0  Restless 0 0 0 0  Easily annoyed or irritable 0 0 0 0  Afraid - awful might happen 0 0 0 0  Total GAD 7 Score 0 0 0 0  Anxiety Difficulty Not difficult at all Not difficult at all Not difficult at all        04/05/2022   10:07 AM 09/01/2022    9:33 AM 10/05/2022   10:05 AM 09/05/2023    9:58 AM 10/04/2023    9:42 AM  Fall Risk  Falls in the past year? 0 0 0 0 0  Was there an injury with Fall? 1 0 0 0 0  Fall Risk Category Calculator 1 0 0 0 0  Fall Risk Category (Retired) Low Low Low    (RETIRED) Patient Fall Risk  Level Low fall risk Low fall risk Low fall risk    Patient at Risk for Falls Due to No Fall Risks  No Fall Risks No Fall Risks No Fall Risks  Fall risk Follow up Falls evaluation completed Falls evaluation completed;Education provided;Falls prevention discussed Falls evaluation completed Falls prevention discussed Falls evaluation completed    Functional Status Survey: Is the patient deaf or have difficulty hearing?: No Does the patient have difficulty seeing, even when wearing glasses/contacts?: No Does the patient have difficulty concentrating, remembering, or making decisions?: No Does the patient have difficulty walking or climbing stairs?: No Does the patient have difficulty dressing or bathing?: No Does the patient have difficulty doing errands alone such as visiting a doctor's office or shopping?: No   Past Medical History:  Past Medical History:  Diagnosis Date   Allergy    Eczema herpeticum    Glaucoma    Heart murmur    Hyperlipidemia    Hypertension    Menopausal disorder    Osteopenia      Surgical History:  Past Surgical History:  Procedure Laterality Date   ABDOMINAL HYSTERECTOMY     complete   BREAST BIOPSY Right 2012   NEG   BREAST BIOPSY Right 09/16/2020   stereo bx of calcs, x marker, path pending   CATARACT EXTRACTION Right 10/03/2016   cataract surgery Left    09/12/16   COLONOSCOPY WITH PROPOFOL N/A 10/02/2017   Procedure: COLONOSCOPY WITH PROPOFOL;  Surgeon: Wyline Mood, MD;  Location: [facility] ENDOSCOPY;  Service: Gastroenterology;  Laterality: N/A;   EYE SURGERY Bilateral 09/12/2016   bilateral cataract surgery    EYE SURGERY  10/03/2016    Medications:  Current Outpatient Medications on File Prior to Visit  Medication Sig   Cod Liver Oil OIL Take 1 Dose by mouth daily.   Evolocumab (REPATHA) 140 MG/ML SOSY Inject 140 mg into the skin every 14 (fourteen) days.   hydrochlorothiazide (HYDRODIURIL) 25 MG tablet Take 1 tablet (25 mg total) by mouth daily.   hydrocortisone 2.5 % cream APPLY TO AFFECTED AREA TWICE A DAY   ibuprofen (ADVIL) 200 MG tablet Take 200 mg by mouth every 6 (six) hours as needed.   latanoprost (XALATAN) 0.005 % ophthalmic solution 1 drop at bedtime.   metoprolol tartrate (LOPRESSOR) 50 MG tablet Take 1 tablet (50 mg total) by mouth daily.   No current facility-administered medications on file prior to visit.    Allergies:  Allergies  Allergen Reactions   Hydrocodone Nausea Only   Lipitor [Atorvastatin] Other (See Comments)    "muscle aches"   Morphine And Codeine Nausea Only   Rosuvastatin Other (See Comments)   Zetia [Ezetimibe] Other (See Comments)    "flu symptoms"   Social History:  Social History   Socioeconomic History   Marital status: Divorced    Spouse name: Not on file   Number of children: 0   Years of education: Not on file   Highest education level: Bachelor's degree (e.g., BA, AB, BS)  Occupational History   Occupation: retired    Comment: Scientist, water quality  Tobacco Use   Smoking status: Never    Smokeless tobacco: Never  Vaping Use   Vaping status: Never Used  Substance and Sexual Activity   Alcohol use: Yes    Alcohol/week: 3.0 standard drinks of alcohol    Types: 3 Glasses of wine per week    Comment: on weekends   Drug use: No   Sexual activity: Not Currently  Other Topics Concern   Not on file  Social History Narrative   Walks outside for 30 minutes 3 days a week, works outside in yard   Social Determinants of Corporate investment banker Strain: Low Risk  (10/03/2023)   Overall Financial Resource Strain (CARDIA)    Difficulty of Paying Living Expenses: Not hard at all  Food Insecurity: No Food Insecurity (10/03/2023)   Hunger Vital Sign    Worried About Running Out of Food in the Last Year: Never true    Ran Out of Food in the Last Year: Never true  Transportation Needs: No Transportation Needs (10/03/2023)   PRAPARE - Administrator, Civil Service (Medical): No    Lack of Transportation (Non-Medical): No  Physical Activity: Insufficiently Active (10/03/2023)   Exercise Vital Sign    Days of Exercise per Week: 3 days    Minutes of Exercise per Session: 30 min  Stress: No Stress Concern Present (10/03/2023)   [employer] of Occupational Health - Occupational Stress Questionnaire    Feeling of Stress : Not at all  Social Connections: Moderately Integrated (10/03/2023)   Social Connection and Isolation Panel [NHANES]    Frequency of Communication with Friends and Family: More than three times a week    Frequency of Social Gatherings with Friends and Family: More than three times a week    Attends Religious Services: More than 4 times per year    Active Member of [employer] or Organizations: Yes    Attends Engineer, structural: More than 4 times per year    Marital Status: Divorced  Intimate Partner Violence: Not At Risk (10/04/2023)   Humiliation, Afraid, Rape, and Kick questionnaire    Fear of Current or Ex-Partner: No    Emotionally  Abused: No    Physically Abused: No    Sexually Abused: No   Social History   Tobacco Use  Smoking Status Never  Smokeless Tobacco Never   Social History   Substance and Sexual Activity  Alcohol Use Yes   Alcohol/week: 3.0 standard drinks of alcohol   Types: 3 Glasses of wine per week   Comment: on weekends    Family History:  Family History  Problem Relation Age of Onset   Alzheimer's disease Mother    Hypertension Father    Hypertension Sister    Breast cancer Neg Hx     Past medical history, surgical history, medications, allergies, family history and social history reviewed with patient today and changes made to appropriate areas of the chart.   ROS All other ROS negative except what is listed above and in the HPI.      Objective:    BP 108/68   Pulse 72   Temp 97.9 F (36.6 C) (Oral)   Ht 5' 3.7" (1.618 m)   Wt 171 lb (77.6 kg)   LMP  (LMP Unknown)   SpO2 97%   BMI 29.63 kg/m   Wt Readings from Last 3 Encounters:  10/04/23 171 lb (77.6 kg)  09/05/23 168 lb (76.2 kg)  04/06/23 168 lb 11.2 oz (76.5 kg)    Physical Exam Vitals and nursing note reviewed. Exam conducted with a chaperone present.  Constitutional:      General: She is awake. She is not in acute distress.    Appearance: She is well-developed and well-groomed. She is not ill-appearing or toxic-appearing.  HENT:     Head: Normocephalic and atraumatic.     Right Ear: Hearing, tympanic  membrane, ear canal and external ear normal. No drainage.     Left Ear: Hearing, tympanic membrane, ear canal and external ear normal. No drainage.     Nose: Nose normal.     Right Sinus: No maxillary sinus tenderness or frontal sinus tenderness.     Left Sinus: No maxillary sinus tenderness or frontal sinus tenderness.     Mouth/Throat:     Mouth: Mucous membranes are moist.     Pharynx: Oropharynx is clear. Uvula midline. No pharyngeal swelling, oropharyngeal exudate or posterior oropharyngeal erythema.   Eyes:     General: Lids are normal.        Right eye: No discharge.        Left eye: No discharge.     Extraocular Movements: Extraocular movements intact.     Conjunctiva/sclera: Conjunctivae normal.     Pupils: Pupils are equal, round, and reactive to light.     Visual Fields: Right eye visual fields normal and left eye visual fields normal.  Neck:     Thyroid: No thyromegaly.     Vascular: No carotid bruit.     Trachea: Trachea normal.  Cardiovascular:     Rate and Rhythm: Normal rate and regular rhythm.     Heart sounds: Murmur heard.     Systolic murmur is present with a grade of 2/6.     No gallop.  Pulmonary:     Effort: Pulmonary effort is normal. No accessory muscle usage or respiratory distress.     Breath sounds: Normal breath sounds.  Chest:  Breasts:    Right: Normal.     Left: Normal.  Abdominal:     General: Bowel sounds are normal.     Palpations: Abdomen is soft. There is no hepatomegaly or splenomegaly.     Tenderness: There is no abdominal tenderness.  Musculoskeletal:        General: Normal range of motion.     Cervical back: Normal range of motion and neck supple.     Right lower leg: No edema.     Left lower leg: No edema.  Lymphadenopathy:     Head:     Right side of head: No submental, submandibular, tonsillar, preauricular or posterior auricular adenopathy.     Left side of head: No submental, submandibular, tonsillar, preauricular or posterior auricular adenopathy.     Cervical: No cervical adenopathy.     Upper Body:     Right upper body: No supraclavicular, axillary or pectoral adenopathy.     Left upper body: No supraclavicular, axillary or pectoral adenopathy.  Skin:    General: Skin is warm and dry.     Capillary Refill: Capillary refill takes less than 2 seconds.     Findings: No rash.  Neurological:     Mental Status: She is alert and oriented to person, place, and time.     Gait: Gait is intact.     Deep Tendon Reflexes: Reflexes are  normal and symmetric.     Reflex Scores:      Brachioradialis reflexes are 2+ on the right side and 2+ on the left side.      Patellar reflexes are 2+ on the right side and 2+ on the left side. Psychiatric:        Attention and Perception: Attention normal.        Mood and Affect: Mood normal.        Speech: Speech normal.        Behavior: Behavior normal.  Behavior is cooperative.        Thought Content: Thought content normal.        Judgment: Judgment normal.    Results for orders placed or performed in visit on 04/06/23  Comprehensive metabolic panel  Result Value Ref Range   Glucose 94 70 - 99 mg/dL   BUN 12 8 - 27 mg/dL   Creatinine, Ser 7.82 0.57 - 1.00 mg/dL   eGFR 76 >95 AO/ZHY/8.65   BUN/Creatinine Ratio 15 12 - 28   Sodium 140 134 - 144 mmol/L   Potassium 3.6 3.5 - 5.2 mmol/L   Chloride 101 96 - 106 mmol/L   CO2 26 20 - 29 mmol/L   Calcium 9.5 8.7 - 10.3 mg/dL   Total Protein 6.3 6.0 - 8.5 g/dL   Albumin 4.0 3.8 - 4.8 g/dL   Globulin, Total 2.3 1.5 - 4.5 g/dL   Albumin/Globulin Ratio 1.7 1.2 - 2.2   Bilirubin Total 0.2 0.0 - 1.2 mg/dL   Alkaline Phosphatase 60 44 - 121 IU/L   AST 18 0 - 40 IU/L   ALT 21 0 - 32 IU/L  Lipid Panel w/o Chol/HDL Ratio  Result Value Ref Range   Cholesterol, Total 165 100 - 199 mg/dL   Triglycerides 84 0 - 149 mg/dL   HDL 72 >78 mg/dL   VLDL Cholesterol Cal 16 5 - 40 mg/dL   LDL Chol Calc (NIH) 77 0 - 99 mg/dL      Assessment & Plan:   Problem List Items Addressed This Visit       Cardiovascular and Mediastinum   Hypertension - Primary    Chronic, stable.  BP well below goal in office and at home.  Continue daily BP checks at home and current medication regimen + collaboration with cardiology.  DASH diet focus.  LABS: CMP, CBC, TSH.  Urine ALB 80 October 2023, discussed with her we could consider adding a low dose ACE or ARB in future, but would monitor closely as to not cause hypotension -- recheck urine next visit.  Refills up  to date.  Return in 6 months.      Relevant Orders   CBC with Differential/Platelet   Comprehensive metabolic panel   TSH   Mild mitral regurgitation by prior echocardiogram    Stable.  Noted on echo, continue to monitor.  No symptoms at this time.        Musculoskeletal and Integument   Osteopenia of neck of right femur    On DEXA 2020.  Recommend continued supplement daily, Vit D and calcium + weight bearing exercises.  Repeat DEXA in 2025.  Check Vit D level today.        Relevant Orders   VITAMIN D 25 Hydroxy (Vit-D Deficiency, Fractures)     Other   BMI 29.0-29.9,adult    BMI 29.63.  Recommended eating smaller high protein, low fat meals more frequently and exercising 30 mins a day 5 times a week with a goal of 10-15lb weight loss in the next 3 months. Patient voiced their understanding and motivation to adhere to these recommendations.       Hyperlipidemia    Chronic, ongoing with Repatha -- tolerates this well.  LDL last visit remains stable.  Praised for success and recommend continuing Repatha, will send refills as needed.  Return in 6 months. Lipid panel today. Could consider addition of Zetia if LDL needs tighter control.      Relevant Orders   Comprehensive metabolic panel  Lipid Panel w/o Chol/HDL Ratio   Myalgia due to statin    Tolerating Repatha with improved LDL, continue this regimen and adjust as needed.      Relevant Orders   Comprehensive metabolic panel   Lipid Panel w/o Chol/HDL Ratio   Vitamin D deficiency    Ongoing, stable.  Continue daily supplement.  Vitamin D level today.      Relevant Orders   VITAMIN D 25 Hydroxy (Vit-D Deficiency, Fractures)     Follow up plan: Return in about 6 months (around 04/03/2024) for HTN/HLD, OSTEOPENIA.   LABORATORY TESTING:  - Pap smear: not applicable  IMMUNIZATIONS:   - Tdap: Tetanus vaccination status reviewed: Refused. - Influenza: Up to date - Pneumovax: Up to date - Prevnar: Up to date -  COVID: Up to date - HPV: Not applicable - Shingrix vaccine: had older shingles vaccine, not new one  SCREENING: -Mammogram: Up to date  - Colonoscopy: Not applicable  - Bone Density: Up to date  -Hearing Test: Not applicable  -Spirometry: Not applicable   PATIENT COUNSELING:   Advised to take 1 mg of folate supplement per day if capable of pregnancy.   Sexuality: Discussed sexually transmitted diseases, partner selection, use of condoms, avoidance of unintended pregnancy  and contraceptive alternatives.   Advised to avoid cigarette smoking.  I discussed with the patient that most people either abstain from alcohol or drink within safe limits (<=14/week and <=4 drinks/occasion for males, <=7/weeks and <= 3 drinks/occasion for females) and that the risk for alcohol disorders and other health effects rises proportionally with the number of drinks per week and how often a drinker exceeds daily limits.  Discussed cessation/primary prevention of drug use and availability of treatment for abuse.   Diet: Encouraged to adjust caloric intake to maintain  or achieve ideal body weight, to reduce intake of dietary saturated fat and total fat, to limit sodium intake by avoiding high sodium foods and not adding table salt, and to maintain adequate dietary potassium and calcium preferably from fresh fruits, vegetables, and low-fat dairy products.    Stressed the importance of regular exercise  Injury prevention: Discussed safety belts, safety helmets, smoke detector, smoking near bedding or upholstery.   Dental health: Discussed importance of regular tooth brushing, flossing, and dental visits.    NEXT PREVENTATIVE PHYSICAL DUE IN 1 YEAR. Return in about 6 months (around 04/03/2024) for HTN/HLD, OSTEOPENIA.

## 2023-10-05 LAB — LIPID PANEL W/O CHOL/HDL RATIO
Cholesterol, Total: 160 mg/dL (ref 100–199)
HDL: 66 mg/dL (ref >39)
LDL Chol Calc (NIH): 77 mg/dL (ref 0–99)
Triglycerides: 95 mg/dL (ref 0–149)
VLDL Cholesterol Cal: 17 mg/dL (ref 5–40)

## 2023-10-05 LAB — CBC WITH DIFFERENTIAL/PLATELET
Basophils Absolute: 0.1 10*3/uL (ref 0.0–0.2)
Basos: 1 %
EOS (ABSOLUTE): 0.3 10*3/uL (ref 0.0–0.4)
Eos: 6 %
Hematocrit: 44.9 % (ref 34.0–46.6)
Hemoglobin: 14.6 g/dL (ref 11.1–15.9)
Immature Grans (Abs): 0 10*3/uL (ref 0.0–0.1)
Immature Granulocytes: 0 %
Lymphocytes Absolute: 1.9 10*3/uL (ref 0.7–3.1)
Lymphs: 35 %
MCH: 29.9 pg (ref 26.6–33.0)
MCHC: 32.5 g/dL (ref 31.5–35.7)
MCV: 92 fL (ref 79–97)
Monocytes Absolute: 0.4 10*3/uL (ref 0.1–0.9)
Monocytes: 7 %
Neutrophils Absolute: 2.9 10*3/uL (ref 1.4–7.0)
Neutrophils: 51 %
Platelets: 316 10*3/uL (ref 150–450)
RBC: 4.88 x10E6/uL (ref 3.77–5.28)
RDW: 13.5 % (ref 11.7–15.4)
WBC: 5.6 10*3/uL (ref 3.4–10.8)

## 2023-10-05 LAB — COMPREHENSIVE METABOLIC PANEL
ALT: 24 [IU]/L (ref 0–32)
AST: 20 [IU]/L (ref 0–40)
Albumin: 4.2 g/dL (ref 3.8–4.8)
Alkaline Phosphatase: 63 [IU]/L (ref 44–121)
BUN/Creatinine Ratio: 14 (ref 12–28)
BUN: 10 mg/dL (ref 8–27)
Bilirubin Total: 0.3 mg/dL (ref 0.0–1.2)
CO2: 25 mmol/L (ref 20–29)
Calcium: 9.6 mg/dL (ref 8.7–10.3)
Chloride: 101 mmol/L (ref 96–106)
Creatinine, Ser: 0.72 mg/dL (ref 0.57–1.00)
Globulin, Total: 2.3 g/dL (ref 1.5–4.5)
Glucose: 107 mg/dL — ABNORMAL HIGH (ref 70–99)
Potassium: 3.9 mmol/L (ref 3.5–5.2)
Sodium: 140 mmol/L (ref 134–144)
Total Protein: 6.5 g/dL (ref 6.0–8.5)
eGFR: 87 mL/min/{1.73_m2} (ref >59)

## 2023-10-05 LAB — TSH: TSH: 1.26 u[IU]/mL (ref 0.450–4.500)

## 2023-10-05 LAB — VITAMIN D 25 HYDROXY (VIT D DEFICIENCY, FRACTURES): Vit D, 25-Hydroxy: 30.9 ng/mL (ref 30.0–100.0)

## 2023-10-05 NOTE — Progress Notes (Signed)
Contacted via MyChart   Good morning Janet Taylor, your labs have returned and overall look great!!  No medication changes needed.  Continue current regimen.  Great job!! Any questions? Keep being stellar!!  Thank you for allowing me to participate in your care.  I appreciate you. Kindest regards, Tate Jerkins

## 2023-10-10 ENCOUNTER — Encounter: Payer: Medicare Other | Admitting: Nurse Practitioner

## 2023-10-10 DIAGNOSIS — Z961 Presence of intraocular lens: Secondary | ICD-10-CM | POA: Diagnosis not present

## 2023-10-10 DIAGNOSIS — H401231 Low-tension glaucoma, bilateral, mild stage: Secondary | ICD-10-CM | POA: Diagnosis not present

## 2023-10-10 DIAGNOSIS — H18513 Endothelial corneal dystrophy, bilateral: Secondary | ICD-10-CM | POA: Diagnosis not present

## 2023-10-10 DIAGNOSIS — H26492 Other secondary cataract, left eye: Secondary | ICD-10-CM | POA: Diagnosis not present

## 2023-10-16 DIAGNOSIS — Z961 Presence of intraocular lens: Secondary | ICD-10-CM | POA: Diagnosis not present

## 2023-10-16 DIAGNOSIS — H26492 Other secondary cataract, left eye: Secondary | ICD-10-CM | POA: Diagnosis not present

## 2023-10-16 DIAGNOSIS — H401232 Low-tension glaucoma, bilateral, moderate stage: Secondary | ICD-10-CM | POA: Diagnosis not present

## 2024-01-23 DIAGNOSIS — Z961 Presence of intraocular lens: Secondary | ICD-10-CM | POA: Diagnosis not present

## 2024-01-23 DIAGNOSIS — H401232 Low-tension glaucoma, bilateral, moderate stage: Secondary | ICD-10-CM | POA: Diagnosis not present

## 2024-01-23 DIAGNOSIS — H52223 Regular astigmatism, bilateral: Secondary | ICD-10-CM | POA: Diagnosis not present

## 2024-01-23 DIAGNOSIS — H5203 Hypermetropia, bilateral: Secondary | ICD-10-CM | POA: Diagnosis not present

## 2024-01-23 DIAGNOSIS — H524 Presbyopia: Secondary | ICD-10-CM | POA: Diagnosis not present

## 2024-01-30 ENCOUNTER — Other Ambulatory Visit: Payer: Self-pay | Admitting: Nurse Practitioner

## 2024-01-31 NOTE — Telephone Encounter (Signed)
 Rx was sent to pharmacy 04/06/23 #6ML/4.   Requested Prescriptions  Refused Prescriptions Disp Refills   REPATHA 140 MG/ML SOSY [Pharmacy Med Name: REPATHA 140 MG/ML SYRINGE]  4    Sig: INJECT 140 MG INTO THE SKIN EVERY 14 (FOURTEEN) DAYS.     Cardiovascular: PCSK9 Inhibitors Failed - 01/31/2024  2:21 PM      Failed - Lipid Panel completed within the last 12 months    Cholesterol, Total  Date Value Ref Range Status  10/04/2023 160 100 - 199 mg/dL Final   Cholesterol Piccolo, Waived  Date Value Ref Range Status  04/07/2020 143 <200 mg/dL Final    Comment:                            Desirable                <200                         Borderline High      200- 239                         High                     >239    LDL Chol Calc (NIH)  Date Value Ref Range Status  10/04/2023 77 0 - 99 mg/dL Final   HDL  Date Value Ref Range Status  10/04/2023 66 >39 mg/dL Final   Triglycerides  Date Value Ref Range Status  10/04/2023 95 0 - 149 mg/dL Final   Triglycerides Piccolo,Waived  Date Value Ref Range Status  04/07/2020 91 <150 mg/dL Final    Comment:                            Normal                   <150                         Borderline High     150 - 199                         High                200 - 499                         Very High                >499          Passed - Valid encounter within last 12 months    Recent Outpatient Visits           3 months ago Primary hypertension   [place] [facility] [place], Corrie Dandy T, NP   10 months ago Primary hypertension   [place] [facility] [place], [place] T, NP   1 year ago Primary hypertension   [place] [facility] [place], Corrie Dandy T, NP   1 year ago Primary hypertension   [place] [facility] [place], Crystal T, NP   2 years ago Primary hypertension   [place] [facility] [place], Dorie Rank, NP  Future Appointments              In 2 months [place], Dorie Rank, NP [place] [facility]

## 2024-03-31 NOTE — Patient Instructions (Signed)
 Be Involved in Caring For Your Health:  Taking Medications When medications are taken as directed, they can greatly improve your health. But if they are not taken as prescribed, they may not work. In some cases, not taking them correctly can be harmful. To help ensure your treatment remains effective and safe, understand your medications and how to take them. Bring your medications to each visit for review by your provider.  Your lab results, notes, and after visit summary will be available on My Chart. We strongly encourage you to use this feature. If lab results are abnormal the clinic will contact you with the appropriate steps. If the clinic does not contact you assume the results are satisfactory. You can always view your results on My Chart. If you have questions regarding your health or results, please contact the clinic during office hours. You can also ask questions on My Chart.  We at [facility] are grateful that you chose [place] to provide your care. We strive to provide evidence-based and compassionate care and are always looking for feedback. If you get a survey from the clinic please complete this so we can hear your opinions.  DASH Eating Plan DASH stands for Dietary Approaches to Stop Hypertension. The DASH eating plan is a healthy eating plan that has been shown to: Lower high blood pressure (hypertension). Reduce your risk for type 2 diabetes, heart disease, and stroke. Help with weight loss. What are tips for following this plan? Reading food labels Check food labels for the amount of salt (sodium) per serving. Choose foods with less than 5 percent of the Daily Value (DV) of sodium. In general, foods with less than 300 milligrams (mg) of sodium per serving fit into this eating plan. To find whole grains, look for the word "whole" as the first word in the ingredient list. Shopping Buy products labeled as "low-sodium" or "no salt added." Buy fresh foods. Avoid canned  foods and pre-made or frozen meals. Cooking Try not to add salt when you cook. Use salt-free seasonings or herbs instead of table salt or sea salt. Check with your health care provider or pharmacist before using salt substitutes. Do not fry foods. Cook foods in healthy ways, such as baking, boiling, grilling, roasting, or broiling. Cook using oils that are good for your heart. These include olive, canola, avocado, soybean, and sunflower oil. Meal planning  Eat a balanced diet. This should include: 4 or more servings of fruits and 4 or more servings of vegetables each day. Try to fill half of your plate with fruits and vegetables. 6-8 servings of whole grains each day. 6 or less servings of lean meat, poultry, or fish each day. 1 oz is 1 serving. A 3 oz (85 g) serving of meat is about the same size as the palm of your hand. One egg is 1 oz (28 g). 2-3 servings of low-fat dairy each day. One serving is 1 cup (237 mL). 1 serving of nuts, seeds, or beans 5 times each week. 2-3 servings of heart-healthy fats. Healthy fats called omega-3 fatty acids are found in foods such as walnuts, flaxseeds, fortified milks, and eggs. These fats are also found in cold-water fish, such as sardines, salmon, and mackerel. Limit how much you eat of: Canned or prepackaged foods. Food that is high in trans fat, such as fried foods. Food that is high in saturated fat, such as fatty meat. Desserts and other sweets, sugary drinks, and other foods with added sugar. Full-fat  dairy products. Do not salt foods before eating. Do not eat more than 4 egg yolks a week. Try to eat at least 2 vegetarian meals a week. Eat more home-cooked food and less restaurant, buffet, and fast food. Lifestyle When eating at a restaurant, ask if your food can be made with less salt or no salt. If you drink alcohol: Limit how much you have to: 0-1 drink a day if you are female. 0-2 drinks a day if you are female. Know how much alcohol is in  your drink. In the [place] one drink is one 12 oz bottle of beer (355 mL), one 5 oz glass of wine (148 mL), or one 1 oz glass of hard liquor (44 mL). General information Avoid eating more than 2,300 mg of salt a day. If you have hypertension, you may need to reduce your sodium intake to 1,500 mg a day. Work with your provider to stay at a healthy body weight or lose weight. Ask what the best weight range is for you. On most days of the week, get at least 30 minutes of exercise that causes your heart to beat faster. This may include walking, swimming, or biking. Work with your provider or dietitian to adjust your eating plan to meet your specific calorie needs. What foods should I eat? Fruits All fresh, dried, or frozen fruit. Canned fruits that are in their natural juice and do not have sugar added to them. Vegetables Fresh or frozen vegetables that are raw, steamed, roasted, or grilled. Low-sodium or reduced-sodium tomato and vegetable juice. Low-sodium or reduced-sodium tomato sauce and tomato paste. Low-sodium or reduced-sodium canned vegetables. Grains Whole-grain or whole-wheat bread. Whole-grain or whole-wheat pasta. Brown rice. Orpah Cobb. Bulgur. Whole-grain and low-sodium cereals. Pita bread. Low-fat, low-sodium crackers. Whole-wheat flour tortillas. Meats and other proteins Skinless chicken or [place]. Ground chicken or [place]. Pork with fat trimmed off. Fish and seafood. Egg whites. Dried beans, peas, or lentils. Unsalted nuts, nut butters, and seeds. Unsalted canned beans. Lean cuts of beef with fat trimmed off. Low-sodium, lean precooked or cured meat, such as sausages or meat loaves. Dairy Low-fat (1%) or fat-free (skim) milk. Reduced-fat, low-fat, or fat-free cheeses. Nonfat, low-sodium ricotta or cottage cheese. Low-fat or nonfat yogurt. Low-fat, low-sodium cheese. Fats and oils Soft margarine without trans fats. Vegetable oil. Reduced-fat, low-fat, or light mayonnaise and salad  dressings (reduced-sodium). Canola, safflower, olive, avocado, soybean, and sunflower oils. Avocado. Seasonings and condiments Herbs. Spices. Seasoning mixes without salt. Other foods Unsalted popcorn and pretzels. Fat-free sweets. The items listed above may not be all the foods and drinks you can have. Talk to a dietitian to learn more. What foods should I avoid? Fruits Canned fruit in a light or heavy syrup. Fried fruit. Fruit in cream or butter sauce. Vegetables Creamed or fried vegetables. Vegetables in a cheese sauce. Regular canned vegetables that are not marked as low-sodium or reduced-sodium. Regular canned tomato sauce and paste that are not marked as low-sodium or reduced-sodium. Regular tomato and vegetable juices that are not marked as low-sodium or reduced-sodium. Rosita Fire. Olives. Grains Baked goods made with fat, such as croissants, muffins, or some breads. Dry pasta or rice meal packs. Meats and other proteins Fatty cuts of meat. Ribs. Fried meat. Tomasa Blase. Bologna, salami, and other precooked or cured meats, such as sausages or meat loaves, that are not lean and low in sodium. Fat from the back of a pig (fatback). Bratwurst. Salted nuts and seeds. Canned beans with added salt. Canned  or smoked fish. Whole eggs or egg yolks. Chicken or [place] with skin. Dairy Whole or 2% milk, cream, and half-and-half. Whole or full-fat cream cheese. Whole-fat or sweetened yogurt. Full-fat cheese. Nondairy creamers. Whipped toppings. Processed cheese and cheese spreads. Fats and oils Butter. Stick margarine. Lard. Shortening. Ghee. Bacon fat. Tropical oils, such as coconut, palm kernel, or palm oil. Seasonings and condiments Onion salt, garlic salt, seasoned salt, table salt, and sea salt. Worcestershire sauce. Tartar sauce. Barbecue sauce. Teriyaki sauce. Soy sauce, including reduced-sodium soy sauce. Steak sauce. Canned and packaged gravies. Fish sauce. Oyster sauce. Cocktail sauce. Store-bought  horseradish. Ketchup. Mustard. Meat flavorings and tenderizers. Bouillon cubes. Hot sauces. Pre-made or packaged marinades. Pre-made or packaged taco seasonings. Relishes. Regular salad dressings. Other foods Salted popcorn and pretzels. The items listed above may not be all the foods and drinks you should avoid. Talk to a dietitian to learn more. Where to find more information National Heart, Lung, and Blood Institute (NHLBI): BuffaloDryCleaner.gl American Heart Association (AHA): heart.org Academy of Nutrition and Dietetics: eatright.org National Kidney Foundation (NKF): kidney.org This information is not intended to replace advice given to you by your health care provider. Make sure you discuss any questions you have with your health care provider. Document Revised: 12/15/2022 Document Reviewed: 12/15/2022 Elsevier Patient Education  2024 [employer].

## 2024-04-03 ENCOUNTER — Ambulatory Visit: Payer: Self-pay | Admitting: Nurse Practitioner

## 2024-04-03 ENCOUNTER — Encounter: Payer: Self-pay | Admitting: Nurse Practitioner

## 2024-04-03 VITALS — BP 128/76 | HR 74 | Temp 98.4°F | Ht 63.5 in | Wt 172.8 lb

## 2024-04-03 DIAGNOSIS — I10 Essential (primary) hypertension: Secondary | ICD-10-CM

## 2024-04-03 DIAGNOSIS — E7800 Pure hypercholesterolemia, unspecified: Secondary | ICD-10-CM | POA: Diagnosis not present

## 2024-04-03 DIAGNOSIS — M791 Myalgia, unspecified site: Secondary | ICD-10-CM | POA: Diagnosis not present

## 2024-04-03 DIAGNOSIS — T466X5A Adverse effect of antihyperlipidemic and antiarteriosclerotic drugs, initial encounter: Secondary | ICD-10-CM

## 2024-04-03 DIAGNOSIS — B000 Eczema herpeticum: Secondary | ICD-10-CM

## 2024-04-03 DIAGNOSIS — M85851 Other specified disorders of bone density and structure, right thigh: Secondary | ICD-10-CM

## 2024-04-03 DIAGNOSIS — Z6829 Body mass index (BMI) 29.0-29.9, adult: Secondary | ICD-10-CM | POA: Diagnosis not present

## 2024-04-03 DIAGNOSIS — H401231 Low-tension glaucoma, bilateral, mild stage: Secondary | ICD-10-CM

## 2024-04-03 LAB — MICROALBUMIN, URINE WAIVED
Creatinine, Urine Waived: 100 mg/dL (ref 10–300)
Microalb, Ur Waived: 80 mg/L — ABNORMAL HIGH (ref 0–19)
Microalb/Creat Ratio: <30 mg/g (ref <30)

## 2024-04-03 MED ORDER — REPATHA 140 MG/ML ~~LOC~~ SOSY
140.0000 mg | PREFILLED_SYRINGE | SUBCUTANEOUS | 4 refills | Status: DC
Start: 2024-04-03 — End: 2024-10-14

## 2024-04-03 MED ORDER — HYDROCORTISONE 2.5 % EX CREA: TOPICAL_CREAM | CUTANEOUS | 4 refills | Status: AC

## 2024-04-03 MED ORDER — METOPROLOL TARTRATE 50 MG PO TABS: 50.0000 mg | ORAL_TABLET | Freq: Every day | ORAL | 4 refills | Status: AC

## 2024-04-03 MED ORDER — HYDROCHLOROTHIAZIDE 25 MG PO TABS
25.0000 mg | ORAL_TABLET | Freq: Every day | ORAL | 4 refills | Status: AC
Start: 2024-04-03 — End: ?

## 2024-04-03 NOTE — Assessment & Plan Note (Signed)
 Chronic, stable.  BP well below goal in office and at home.  Continue daily BP checks at home and current medication regimen + collaboration with cardiology.  DASH diet focus.  LABS: CMP and urine ALB.  Urine ALB 80 October 2023, discussed with her we could consider adding a low dose ACE or ARB in future, but would monitor closely as to not cause hypotension.  Refills up to date.  Return in 6 months.

## 2024-04-03 NOTE — Assessment & Plan Note (Signed)
BMI 30.13.  Recommended eating smaller high protein, low fat meals more frequently and exercising 30 mins a day 5 times a week with a goal of 10-15lb weight loss in the next 3 months. Patient voiced their understanding and motivation to adhere to these recommendations. ° °

## 2024-04-03 NOTE — Assessment & Plan Note (Signed)
Tolerating Repatha with improved LDL, continue this regimen and adjust as needed. 

## 2024-04-03 NOTE — Assessment & Plan Note (Signed)
Chronic, ongoing with Repatha -- tolerates this well.  LDL last visit remains stable.  Praised for success and recommend continuing Repatha, will send refills as needed.  Return in 6 months. Lipid panel today. Could consider addition of Zetia if LDL needs tighter control.

## 2024-04-03 NOTE — Progress Notes (Signed)
 BP 128/76   Pulse 74   Temp 98.4 F (36.9 C) (Oral)   Ht 5' 3.5" (1.613 m)   Wt 172 lb 12.8 oz (78.4 kg)   LMP  (LMP Unknown)   SpO2 97%   BMI 30.13 kg/m    Subjective:    Patient ID: Janet Taylor, female    DOB: 01-08-1946, 78 y.o.   MRN: [id number]  HPI: Janet Taylor is a 78 y.o. female  Chief Complaint  Patient presents with   Hyperlipidemia   Hypertension   Osteopenia   HYPERTENSION / HYPERLIPIDEMIA Taking HCTZ, Metoprolol , and Repatha .  Had visit with cardiology on 08/16/22, no changes made.  Last echo 08/19/21 showed EF >55%.  LDL continues to be stable with Repatha  on board -- poor tolerance of all statins in past. Satisfied with current treatment? yes Duration of hypertension: chronic BP monitoring frequency: twice a week BP range: 130/70 range on average BP medication side effects: no Duration of hyperlipidemia: chronic Cholesterol medication side effects: no Cholesterol supplements: fish oil Medication compliance: good compliance Aspirin : no Recent stressors: no Recurrent headaches: no Visual changes: no Palpitations: no Dyspnea: no Chest pain: no Lower extremity edema: no Dizzy/lightheaded: no   OSTEOPENIA Noted on DEXA in June 2020 with T Score -1.1.  Continues on Vitamin D  supplement daily. Satisfied with current treatment?: yes Adequate calcium  & vitamin D : yes Weight bearing exercises: yes     04/03/2024   10:09 AM 10/04/2023    9:43 AM 09/05/2023    9:55 AM 10/05/2022   10:05 AM 09/01/2022    9:38 AM  Depression screen PHQ 2/9  Decreased Interest 0 0 0 0 0  Down, Depressed, Hopeless 0 0 0 0 0  PHQ - 2 Score 0 0 0 0 0  Altered sleeping 0 0 0 0 0  Tired, decreased energy 0 0 0 0 1  Change in appetite 0 0 0 0 0  Feeling bad or failure about yourself  0 0 0 0 0  Trouble concentrating 0 0 0 0 0  Moving slowly or fidgety/restless 0 0 0 0 0  Suicidal thoughts 0 0 0 0 0  PHQ-9 Score 0 0 0 0 1  Difficult doing work/chores Not difficult at all  Not difficult at all Not difficult at all Not difficult at all Not difficult at all       04/03/2024   10:09 AM 10/04/2023    9:43 AM 10/05/2022   10:05 AM 04/05/2022   10:07 AM  GAD 7 : Generalized Anxiety Score  Nervous, Anxious, on Edge 0 0 0 0  Control/stop worrying 0 0 0 0  Worry too much - different things 0 0 0 0  Trouble relaxing 0 0 0 0  Restless 0 0 0 0  Easily annoyed or irritable 0 0 0 0  Afraid - awful might happen 0 0 0 0  Total GAD 7 Score 0 0 0 0  Anxiety Difficulty Not difficult at all Not difficult at all Not difficult at all Not difficult at all      Relevant past medical, surgical, family and social history reviewed and updated as indicated. Interim medical history since our last visit reviewed. Allergies and medications reviewed and updated.  Review of Systems  Constitutional:  Negative for activity change, appetite change, diaphoresis, fatigue and fever.  Respiratory:  Negative for cough, chest tightness and shortness of breath.   Cardiovascular:  Negative for chest pain, palpitations and leg swelling.  Gastrointestinal: Negative.   Neurological: Negative.   Psychiatric/Behavioral: Negative.     Per HPI unless specifically indicated above     Objective:    BP 128/76   Pulse 74   Temp 98.4 F (36.9 C) (Oral)   Ht 5' 3.5" (1.613 m)   Wt 172 lb 12.8 oz (78.4 kg)   LMP  (LMP Unknown)   SpO2 97%   BMI 30.13 kg/m   Wt Readings from Last 3 Encounters:  04/03/24 172 lb 12.8 oz (78.4 kg)  10/04/23 171 lb (77.6 kg)  09/05/23 168 lb (76.2 kg)    Physical Exam Vitals and nursing note reviewed.  Constitutional:      General: She is awake. She is not in acute distress.    Appearance: She is well-developed and overweight. She is not ill-appearing.  HENT:     Head: Normocephalic.     Right Ear: Hearing normal.     Left Ear: Hearing normal.  Eyes:     General: Lids are normal.        Right eye: No discharge.        Left eye: No discharge.      Conjunctiva/sclera: Conjunctivae normal.     Pupils: Pupils are equal, round, and reactive to light.  Neck:     Thyroid : No thyromegaly.     Vascular: No carotid bruit.  Cardiovascular:     Rate and Rhythm: Normal rate and regular rhythm.     Heart sounds: Normal heart sounds. No murmur heard.    No gallop.  Pulmonary:     Effort: Pulmonary effort is normal. No accessory muscle usage or respiratory distress.     Breath sounds: Normal breath sounds.  Abdominal:     General: Bowel sounds are normal.     Palpations: Abdomen is soft. There is no hepatomegaly or splenomegaly.  Musculoskeletal:     Cervical back: Normal range of motion and neck supple.     Right lower leg: No edema.     Left lower leg: No edema.  Skin:    General: Skin is warm and dry.  Neurological:     Mental Status: She is alert and oriented to person, place, and time.  Psychiatric:        Attention and Perception: Attention normal.        Mood and Affect: Mood normal.        Speech: Speech normal.        Behavior: Behavior normal. Behavior is cooperative.        Thought Content: Thought content normal.    Results for orders placed or performed in visit on 10/04/23  CBC with Differential/Platelet   Collection Time: 10/04/23 10:12 AM  Result Value Ref Range   WBC 5.6 3.4 - 10.8 x10E3/uL   RBC 4.88 3.77 - 5.28 x10E6/uL   Hemoglobin 14.6 11.1 - 15.9 g/dL   Hematocrit 16.1 09.6 - 46.6 %   MCV 92 79 - 97 fL   MCH 29.9 26.6 - 33.0 pg   MCHC 32.5 31.5 - 35.7 g/dL   RDW 04.5 40.9 - 81.1 %   Platelets 316 150 - 450 x10E3/uL   Neutrophils 51 Not Estab. %   Lymphs 35 Not Estab. %   Monocytes 7 Not Estab. %   Eos 6 Not Estab. %   Basos 1 Not Estab. %   Neutrophils Absolute 2.9 1.4 - 7.0 x10E3/uL   Lymphocytes Absolute 1.9 0.7 - 3.1 x10E3/uL   Monocytes Absolute 0.4  0.1 - 0.9 x10E3/uL   EOS (ABSOLUTE) 0.3 0.0 - 0.4 x10E3/uL   Basophils Absolute 0.1 0.0 - 0.2 x10E3/uL   Immature Granulocytes 0 Not Estab. %    Immature Grans (Abs) 0.0 0.0 - 0.1 x10E3/uL  Comprehensive metabolic panel   Collection Time: 10/04/23 10:12 AM  Result Value Ref Range   Glucose 107 (H) 70 - 99 mg/dL   BUN 10 8 - 27 mg/dL   Creatinine, Ser 2.95 0.57 - 1.00 mg/dL   eGFR 87 >62 ZH/YQM/5.78   BUN/Creatinine Ratio 14 12 - 28   Sodium 140 134 - 144 mmol/L   Potassium 3.9 3.5 - 5.2 mmol/L   Chloride 101 96 - 106 mmol/L   CO2 25 20 - 29 mmol/L   Calcium  9.6 8.7 - 10.3 mg/dL   Total Protein 6.5 6.0 - 8.5 g/dL   Albumin 4.2 3.8 - 4.8 g/dL   Globulin, Total 2.3 1.5 - 4.5 g/dL   Bilirubin Total 0.3 0.0 - 1.2 mg/dL   Alkaline Phosphatase 63 44 - 121 IU/L   AST 20 0 - 40 IU/L   ALT 24 0 - 32 IU/L  Lipid Panel w/o Chol/HDL Ratio   Collection Time: 10/04/23 10:12 AM  Result Value Ref Range   Cholesterol, Total 160 100 - 199 mg/dL   Triglycerides 95 0 - 149 mg/dL   HDL 66 >46 mg/dL   VLDL Cholesterol Cal 17 5 - 40 mg/dL   LDL Chol Calc (NIH) 77 0 - 99 mg/dL  TSH   Collection Time: 10/04/23 10:12 AM  Result Value Ref Range   TSH 1.260 0.450 - 4.500 uIU/mL  VITAMIN D  25 Hydroxy (Vit-D Deficiency, Fractures)   Collection Time: 10/04/23 10:12 AM  Result Value Ref Range   Vit D, 25-Hydroxy 30.9 30.0 - 100.0 ng/mL      Assessment & Plan:   Problem List Items Addressed This Visit       Cardiovascular and Mediastinum   Hypertension - Primary   Chronic, stable.  BP well below goal in office and at home.  Continue daily BP checks at home and current medication regimen + collaboration with cardiology.  DASH diet focus.  LABS: CMP and urine ALB.  Urine ALB 80 October 2023, discussed with her we could consider adding a low dose ACE or ARB in future, but would monitor closely as to not cause hypotension.  Refills up to date.  Return in 6 months.      Relevant Medications   Evolocumab  (REPATHA ) 140 MG/ML SOSY   hydrochlorothiazide  (HYDRODIURIL ) 25 MG tablet   metoprolol  tartrate (LOPRESSOR ) 50 MG tablet   Other Relevant  Orders   Comprehensive metabolic panel with GFR   Microalbumin, Urine Waived     Musculoskeletal and Integument   Osteopenia of neck of right femur   On DEXA 2020.  Recommend continued supplement daily, Vit D and calcium  + weight bearing exercises.  Repeat DEXA in 2025 -- will place order and she can schedule in October with her mammogram, discussed with patient.  Check Vit D level annually.      Relevant Orders   DG Bone Density   Eczema herpeticum   Medication refills per request.      Relevant Medications   hydrocortisone  2.5 % cream     Other   Myalgia due to statin   Tolerating Repatha  with improved LDL, continue this regimen and adjust as needed.      Hyperlipidemia   Chronic, ongoing with Repatha  --  tolerates this well.  LDL last visit remains stable.  Praised for success and recommend continuing Repatha , will send refills as needed.  Return in 6 months. Lipid panel today. Could consider addition of Zetia if LDL needs tighter control.      Relevant Medications   Evolocumab  (REPATHA ) 140 MG/ML SOSY   hydrochlorothiazide  (HYDRODIURIL ) 25 MG tablet   metoprolol  tartrate (LOPRESSOR ) 50 MG tablet   Other Relevant Orders   Comprehensive metabolic panel with GFR   Lipid Panel w/o Chol/HDL Ratio   BMI 29.0-29.9,adult   BMI 30.13.  Recommended eating smaller high protein, low fat meals more frequently and exercising 30 mins a day 5 times a week with a goal of 10-15lb weight loss in the next 3 months. Patient voiced their understanding and motivation to adhere to these recommendations.         Follow up plan: Return in about 6 months (around 10/03/2024) for Annual Exam after 10/03/24.

## 2024-04-03 NOTE — Assessment & Plan Note (Signed)
 On DEXA 2020.  Recommend continued supplement daily, Vit D and calcium  + weight bearing exercises.  Repeat DEXA in 2025 -- will place order and she can schedule in October with her mammogram, discussed with patient.  Check Vit D level annually.

## 2024-04-03 NOTE — Assessment & Plan Note (Signed)
Medication refills per request. 

## 2024-04-04 LAB — COMPREHENSIVE METABOLIC PANEL WITH GFR
ALT: 19 IU/L (ref 0–32)
AST: 15 IU/L (ref 0–40)
Albumin: 4.1 g/dL (ref 3.8–4.8)
Alkaline Phosphatase: 58 IU/L (ref 44–121)
BUN/Creatinine Ratio: 15 (ref 12–28)
BUN: 11 mg/dL (ref 8–27)
Bilirubin Total: 0.2 mg/dL (ref 0.0–1.2)
CO2: 27 mmol/L (ref 20–29)
Calcium: 9.3 mg/dL (ref 8.7–10.3)
Chloride: 103 mmol/L (ref 96–106)
Creatinine, Ser: 0.71 mg/dL (ref 0.57–1.00)
Globulin, Total: 2.1 g/dL (ref 1.5–4.5)
Glucose: 78 mg/dL (ref 70–99)
Potassium: 3.5 mmol/L (ref 3.5–5.2)
Sodium: 143 mmol/L (ref 134–144)
Total Protein: 6.2 g/dL (ref 6.0–8.5)
eGFR: 88 mL/min/{1.73_m2} (ref >59)

## 2024-04-04 LAB — LIPID PANEL W/O CHOL/HDL RATIO
Cholesterol, Total: 161 mg/dL (ref 100–199)
HDL: 68 mg/dL (ref >39)
LDL Chol Calc (NIH): 76 mg/dL (ref 0–99)
Triglycerides: 90 mg/dL (ref 0–149)
VLDL Cholesterol Cal: 17 mg/dL (ref 5–40)

## 2024-04-23 DIAGNOSIS — H5213 Myopia, bilateral: Secondary | ICD-10-CM | POA: Diagnosis not present

## 2024-04-23 DIAGNOSIS — H401232 Low-tension glaucoma, bilateral, moderate stage: Secondary | ICD-10-CM | POA: Diagnosis not present

## 2024-04-23 DIAGNOSIS — H524 Presbyopia: Secondary | ICD-10-CM | POA: Diagnosis not present

## 2024-04-23 DIAGNOSIS — H52223 Regular astigmatism, bilateral: Secondary | ICD-10-CM | POA: Diagnosis not present

## 2024-04-23 DIAGNOSIS — Z961 Presence of intraocular lens: Secondary | ICD-10-CM | POA: Diagnosis not present

## 2024-08-09 ENCOUNTER — Other Ambulatory Visit: Payer: Self-pay | Admitting: Nurse Practitioner

## 2024-08-09 DIAGNOSIS — Z1231 Encounter for screening mammogram for malignant neoplasm of breast: Secondary | ICD-10-CM

## 2024-08-23 DIAGNOSIS — H401232 Low-tension glaucoma, bilateral, moderate stage: Secondary | ICD-10-CM | POA: Diagnosis not present

## 2024-08-23 DIAGNOSIS — Z961 Presence of intraocular lens: Secondary | ICD-10-CM | POA: Diagnosis not present

## 2024-09-10 ENCOUNTER — Ambulatory Visit (INDEPENDENT_AMBULATORY_CARE_PROVIDER_SITE_OTHER): Payer: Self-pay

## 2024-09-10 VITALS — Ht 64.0 in | Wt 165.0 lb

## 2024-09-10 DIAGNOSIS — Z0000 Encounter for general adult medical examination without abnormal findings: Secondary | ICD-10-CM | POA: Diagnosis not present

## 2024-09-10 NOTE — Progress Notes (Signed)
 Subjective:   Janet Taylor is a 78 y.o. who presents for a Medicare Wellness preventive visit.  As a reminder, Annual Wellness Visits don't include a physical exam, and some assessments may be limited, especially if this visit is performed virtually. We may recommend an in-person follow-up visit with your provider if needed.  Visit Complete: Virtual I connected with  Aryka B Yono on 09/10/24 by a audio enabled telemedicine application and verified that I am speaking with the correct person using two identifiers.  Patient Location: Home  Provider Location: Office/Clinic  I discussed the limitations of evaluation and management by telemedicine. The patient expressed understanding and agreed to proceed.  Vital Signs: Because this visit was a virtual/telehealth visit, some criteria may be missing or patient reported. Any vitals not documented were not able to be obtained and vitals that have been documented are patient reported.  VideoDeclined- This patient declined Librarian, academic. Therefore the visit was completed with audio only.  Persons Participating in Visit: Patient.  AWV Questionnaire: Yes: Patient Medicare AWV questionnaire was completed by the patient on 09/05/24; I have confirmed that all information answered by patient is correct and no changes since this date.  Cardiac Risk Factors include: advanced age (>23men, >36 women);dyslipidemia;hypertension     Objective:    Today's Vitals   09/10/24 0835  Weight: 165 lb (74.8 kg)  Height: 5' 4 (1.626 m)   Body mass index is 28.32 kg/m.     09/10/2024    8:42 AM 09/05/2023    9:57 AM 09/01/2022    9:33 AM 08/30/2021    9:45 AM 08/28/2020    9:45 AM 08/08/2019   11:08 AM 08/03/2018   11:03 AM  Advanced Directives  Does Patient Have a Medical Advance Directive? Yes Yes Yes Yes Yes No Yes   Type of Estate agent of [place];Living will Healthcare Power of [place];Living  will Healthcare Power of [employer] of [place];Living will Healthcare Power of [place];Living will  Healthcare Power of [place];Living will  Does patient want to make changes to medical advance directive? No - Patient declined No - Patient declined       Copy of Healthcare Power of Attorney in Chart? No - copy requested No - copy requested No - copy requested No - copy requested No - copy requested  No - copy requested      Data saved with a previous flowsheet row definition    Current Medications (verified) Outpatient Encounter Medications as of 09/10/2024  Medication Sig   Evolocumab  (REPATHA ) 140 MG/ML SOSY Inject 140 mg into the skin every 14 (fourteen) days.   hydrochlorothiazide  (HYDRODIURIL ) 25 MG tablet Take 1 tablet (25 mg total) by mouth daily.   hydrocortisone  2.5 % cream APPLY TO AFFECTED AREA TWICE A DAY (Patient taking differently: APPLY TO AFFECTED AREA TWICE A DAY Using PRN)   latanoprost (XALATAN) 0.005 % ophthalmic solution 1 drop at bedtime.   metoprolol  tartrate (LOPRESSOR ) 50 MG tablet Take 1 tablet (50 mg total) by mouth daily.   Cod Liver Oil OIL Take 1 Dose by mouth daily. (Patient not taking: Reported on 09/10/2024)   No facility-administered encounter medications on file as of 09/10/2024.    Allergies (verified) Hydrocodone, Lipitor [atorvastatin], Morphine and codeine, Rosuvastatin , and Zetia [ezetimibe]   History: Past Medical History:  Diagnosis Date   Allergy    Eczema herpeticum    Glaucoma    Heart murmur    Hyperlipidemia  Hypertension    Menopausal disorder    Osteopenia    Past Surgical History:  Procedure Laterality Date   ABDOMINAL HYSTERECTOMY     complete   BREAST BIOPSY Right 2012   NEG   BREAST BIOPSY Right 09/16/2020   stereo bx of calcs, x marker, path pending   CATARACT EXTRACTION Right 10/03/2016   cataract surgery Left    09/12/16   COLONOSCOPY WITH PROPOFOL  N/A 10/02/2017   Procedure: COLONOSCOPY WITH  PROPOFOL ;  Surgeon: Therisa Bi, MD;  Location: [facility] ENDOSCOPY;  Service: Gastroenterology;  Laterality: N/A;   EYE SURGERY Bilateral 09/12/2016   bilateral cataract surgery    EYE SURGERY  10/03/2016   Family History  Problem Relation Age of Onset   Alzheimer's disease Mother    Hypertension Father    Hypertension Sister    Breast cancer Neg Hx    Social History   Socioeconomic History   Marital status: Divorced    Spouse name: Not on file   Number of children: 0   Years of education: Not on file   Highest education level: Bachelor's degree (e.g., BA, AB, BS)  Occupational History   Occupation: retired    Comment: Scientist, water quality  Tobacco Use   Smoking status: Never   Smokeless tobacco: Never  Vaping Use   Vaping status: Never Used  Substance and Sexual Activity   Alcohol use: Yes    Alcohol/week: 3.0 standard drinks of alcohol    Types: 3 Glasses of wine per week    Comment: on weekends   Drug use: No   Sexual activity: Not Currently  Other Topics Concern   Not on file  Social History Narrative   Walks outside for 30 minutes 3 days a week, works outside in yard   Social Drivers of Corporate investment banker Strain: Low Risk  (09/10/2024)   Overall Financial Resource Strain (CARDIA)    Difficulty of Paying Living Expenses: Not hard at all  Food Insecurity: No Food Insecurity (09/10/2024)   Hunger Vital Sign    Worried About Running Out of Food in the Last Year: Never true    Ran Out of Food in the Last Year: Never true  Transportation Needs: No Transportation Needs (09/10/2024)   PRAPARE - Administrator, Civil Service (Medical): No    Lack of Transportation (Non-Medical): No  Physical Activity: Insufficiently Active (09/10/2024)   Exercise Vital Sign    Days of Exercise per Week: 2 days    Minutes of Exercise per Session: 40 min  Stress: No Stress Concern Present (09/10/2024)   [employer] of Occupational Health - Occupational Stress  Questionnaire    Feeling of Stress: Not at all  Social Connections: Moderately Integrated (09/10/2024)   Social Connection and Isolation Panel    Frequency of Communication with Friends and Family: More than three times a week    Frequency of Social Gatherings with Friends and Family: More than three times a week    Attends Religious Services: More than 4 times per year    Active Member of [employer] or Organizations: Yes    Attends Engineer, structural: More than 4 times per year    Marital Status: Divorced    Tobacco Counseling Counseling given: Not Answered    Clinical Intake:  Pre-visit preparation completed: Yes  Pain : No/denies pain     BMI - recorded: 28.32 Nutritional Status: BMI 25 -29 Overweight Nutritional Risks: None Diabetes: No  No results found  for: HGBA1C   How often do you need to have someone help you when you read instructions, pamphlets, or other written materials from your doctor or pharmacy?: 1 - Never  Interpreter Needed?: No  Information entered by :: Vina Ned, CMA   Activities of Daily Living     09/10/2024    8:37 AM 09/05/2024   12:48 PM  In your present state of health, do you have any difficulty performing the following activities:  Hearing? 0 0  Vision? 0 0  Difficulty concentrating or making decisions? 0 0  Walking or climbing stairs? 0 0  Dressing or bathing? 0 0  Doing errands, shopping? 0 0  Preparing Food and eating ? N N  Using the Toilet? N N  In the past six months, have you accidently leaked urine? N N  Do you have problems with loss of bowel control? N N  Managing your Medications? N N  Managing your Finances? N N  Housekeeping or managing your Housekeeping? N N    Patient Care Team: Cannady, Jolene T, NP as PCP - General (Nurse Practitioner) Laurice Francis NOVAK, OD (Optometry)  I have updated your Care Teams any recent Medical Services you may have received from other providers in the past year.      Assessment:   This is a routine wellness examination for Latoshia.  Hearing/Vision screen Hearing Screening - Comments:: Denies hearing loss  Vision Screening - Comments:: Gets routine eye exams, Dr. Francis Laurice Edcouch [facility]   Goals Addressed             This Visit's Progress    Patient Stated       Lose 10 lbs       Depression Screen     09/10/2024    8:40 AM 04/03/2024   10:09 AM 10/04/2023    9:43 AM 09/05/2023    9:55 AM 10/05/2022   10:05 AM 09/01/2022    9:38 AM 04/05/2022   10:07 AM  PHQ 2/9 Scores  PHQ - 2 Score 0 0 0 0 0 0 0  PHQ- 9 Score 0 0 0 0 0 1 0    Fall Risk     09/10/2024    8:43 AM 09/05/2024   12:48 PM 04/03/2024   10:08 AM 10/04/2023    9:42 AM 09/05/2023    9:58 AM  Fall Risk   Falls in the past year? 0 0 0 0 0  Number falls in past yr: 0 0 0 0 0  Injury with Fall? 0 0 0 0 0  Risk for fall due to : No Fall Risks  No Fall Risks No Fall Risks No Fall Risks  Follow up Falls evaluation completed  Falls evaluation completed Falls evaluation completed Falls prevention discussed    MEDICARE RISK AT HOME:  Medicare Risk at Home Any stairs in or around the home?: Yes If so, are there any without handrails?: Yes (2 steps without handrail) Home free of loose throw rugs in walkways, pet beds, electrical cords, etc?: Yes Adequate lighting in your home to reduce risk of falls?: Yes Life alert?: No Use of a cane, walker or w/c?: No Grab bars in the bathroom?: Yes Shower chair or bench in shower?: Yes Elevated toilet seat or a handicapped toilet?: No  TIMED UP AND GO:  Was the test performed?  No  Cognitive Function: 6CIT completed        09/10/2024    8:43 AM 09/05/2023    9:58  AM 09/01/2022    9:35 AM 08/30/2021    9:47 AM 08/28/2020    9:47 AM  6CIT Screen  What Year? 0 points 0 points 0 points 0 points 0 points  What month? 0 points 0 points 0 points 0 points 0 points  What time? 0 points 0 points 0 points 0 points 0 points  Count back from  20 0 points 0 points 0 points 0 points 0 points  Months in reverse 0 points 0 points 0 points 0 points 0 points  Repeat phrase 0 points 0 points 0 points 0 points 0 points  Total Score 0 points 0 points 0 points 0 points 0 points    Immunizations Immunization History  Administered Date(s) Administered   Fluad Quad(high Dose 65+) 09/30/2019, 09/21/2022   INFLUENZA, HIGH DOSE SEASONAL PF 09/17/2018, 09/25/2021, 09/14/2023   PFIZER Comirnaty(Gray Top)Covid-19 Tri-Sucrose Vaccine 03/20/2021   PFIZER(Purple Top)SARS-COV-2 Vaccination 01/06/2020, 01/27/2020, 09/14/2020, 03/20/2021, 08/21/2023   Pfizer Covid-19 Vaccine Bivalent Booster 69yrs & up 08/28/2021, 04/15/2022   Pfizer(Comirnaty)Fall Seasonal Vaccine 12 years and older 09/01/2022, 08/28/2024   Pneumococcal Conjugate-13 04/02/2014   Pneumococcal Polysaccharide-23 04/13/2016   Td 04/14/2004   Zoster, Live 11/17/2008    Screening Tests Health Maintenance  Topic Date Due   Zoster Vaccines- Shingrix (1 of 2) 05/31/1997   DEXA SCAN  06/02/2024   Influenza Vaccine  07/12/2024   Mammogram  09/11/2024   COVID-19 Vaccine (11 - 2024-25 season) 02/25/2025   Medicare Annual Wellness (AWV)  09/10/2025   Pneumococcal Vaccine: 50+ Years  Completed   Hepatitis C Screening  Completed   HPV VACCINES  Aged Out   Meningococcal B Vaccine  Aged Out   DTaP/Tdap/Td  Discontinued   Colonoscopy  Discontinued    Health Maintenance Items Addressed: See Nurse Notes at the end of this note  Additional Screening:  Vision Screening: Recommended annual ophthalmology exams for early detection of glaucoma and other disorders of the eye. Is the patient up to date with their annual eye exam?  Yes  Who is the provider or what is the name of the office in which the patient attends annual eye exams? Dr. Francis Mallick, [place] [facility]  Dental Screening: Recommended annual dental exams for proper oral hygiene  Community Resource Referral / Chronic Care  Management: CRR required this visit?  No   CCM required this visit?  No   Plan:    I have personally reviewed and noted the following in the patient's chart:   Medical and social history Use of alcohol, tobacco or illicit drugs  Current medications and supplements including opioid prescriptions. Patient is not currently taking opioid prescriptions. Functional ability and status Nutritional status Physical activity Advanced directives List of other physicians Hospitalizations, surgeries, and ER visits in previous 12 months Vitals Screenings to include cognitive, depression, and falls Referrals and appointments  In addition, I have reviewed and discussed with patient certain preventive protocols, quality metrics, and best practice recommendations. A written personalized care plan for preventive services as well as general preventive health recommendations were provided to patient.   Vina Ned, CMA   09/10/2024   After Visit Summary: (MyChart) Due to this being a telephonic visit, the after visit summary with patients personalized plan was offered to patient via MyChart   Notes:  Plans to get the flu vaccine May get Tdap and Shingrix vaccines at a later date MMG and DEXA scheduled 09/12/24 Screening colonoscopy no longer recommended due to age

## 2024-09-10 NOTE — Patient Instructions (Addendum)
 Janet Taylor,  Thank you for taking the time for your Medicare Wellness Visit. I appreciate your continued commitment to your health goals. Please review the care plan we discussed, and feel free to reach out if I can assist you further.  Medicare recommends these wellness visits once per year to help you and your care team stay ahead of potential health issues. These visits are designed to focus on prevention, allowing your provider to concentrate on managing your acute and chronic conditions during your regular appointments.  Please note that Annual Wellness Visits do not include a physical exam. Some assessments may be limited, especially if the visit was conducted virtually. If needed, we may recommend a separate in-person follow-up with your provider.  Ongoing Care Seeing your primary care provider every 3 to 6 months helps us  monitor your health and provide consistent, personalized care.   Referrals If a referral was made during today's visit and you haven't received any updates within two weeks, please contact the referred provider directly to check on the status.  Recommended Screenings: Get the flu vaccine at your convenience. Get the tetanus and shingles vaccines at your local pharmacy at your convenience.  Health Maintenance  Topic Date Due   Zoster (Shingles) Vaccine (1 of 2) 05/31/1997   DEXA scan (bone density measurement)  06/02/2024   Flu Shot  07/12/2024   Breast Cancer Screening  09/11/2024   COVID-19 Vaccine (11 - 2024-25 season) 02/25/2025   Medicare Annual Wellness Visit  09/10/2025   Pneumococcal Vaccine for age over 50  Completed   Hepatitis C Screening  Completed   HPV Vaccine  Aged Out   Meningitis B Vaccine  Aged Out   DTaP/Tdap/Td vaccine  Discontinued   Colon Cancer Screening  Discontinued       09/10/2024    8:42 AM  Advanced Directives  Does Patient Have a Medical Advance Directive? Yes  Type of Estate agent of [place];Living will   Does patient want to make changes to medical advance directive? No - Patient declined  Copy of Healthcare Power of Attorney in Chart? No - copy requested   Advance Care Planning is important because it: Ensures you receive medical care that aligns with your values, goals, and preferences. Provides guidance to your family and loved ones, reducing the emotional burden of decision-making during critical moments.  Vision: Annual vision screenings are recommended for early detection of glaucoma, cataracts, and diabetic retinopathy. These exams can also reveal signs of chronic conditions such as diabetes and high blood pressure.  Dental: Annual dental screenings help detect early signs of oral cancer, gum disease, and other conditions linked to overall health, including heart disease and diabetes.  Please see the attached documents for additional preventive care recommendations.   Fall Prevention in the Home, Adult Falls can cause injuries and affect people of all ages. There are many simple things that you can do to make your home safe and to help prevent falls. If you need it, ask for help making these changes. What actions can I take to prevent falls? General information Use good lighting in all rooms. Make sure to: Replace any light bulbs that burn out. Turn on lights if it is dark and use night-lights. Keep items that you use often in easy-to-reach places. Lower the shelves around your home if needed. Move furniture so that there are clear paths around it. Do not keep throw rugs or other things on the floor that can make you trip. If  any of your floors are uneven, fix them. Add color or contrast paint or tape to clearly mark and help you see: Grab bars or handrails. First and last steps of staircases. Where the edge of each step is. If you use a ladder or stepladder: Make sure that it is fully opened. Do not climb a closed ladder. Make sure the sides of the ladder are locked in  place. Have someone hold the ladder while you use it. Know where your pets are as you move through your home. What can I do in the bathroom?     Keep the floor dry. Clean up any water that is on the floor right away. Remove soap buildup in the bathtub or shower. Buildup makes bathtubs and showers slippery. Use non-skid mats or decals on the floor of the bathtub or shower. Attach bath mats securely with double-sided, non-slip rug tape. If you need to sit down while you are in the shower, use a non-slip stool. Install grab bars by the toilet and in the bathtub and shower. Do not use towel bars as grab bars. What can I do in the bedroom? Make sure that you have a light by your bed that is easy to reach. Do not use any sheets or blankets on your bed that hang to the floor. Have a firm bench or chair with side arms that you can use for support when you get dressed. What can I do in the kitchen? Clean up any spills right away. If you need to reach something above you, use a sturdy step stool that has a grab bar. Keep electrical cables out of the way. Do not use floor polish or wax that makes floors slippery. What can I do with my stairs? Do not leave anything on the stairs. Make sure that you have a light switch at the top and the bottom of the stairs. Have them installed if you do not have them. Make sure that there are handrails on both sides of the stairs. Fix handrails that are broken or loose. Make sure that handrails are as long as the staircases. Install non-slip stair treads on all stairs in your home if they do not have carpet. Avoid having throw rugs at the top or bottom of stairs, or secure the rugs with carpet tape to prevent them from moving. Choose a carpet design that does not hide the edge of steps on the stairs. Make sure that carpet is firmly attached to the stairs. Fix any carpet that is loose or worn. What can I do on the outside of my home? Use bright outdoor  lighting. Repair the edges of walkways and driveways and fix any cracks. Clear paths of anything that can make you trip, such as tools or rocks. Add color or contrast paint or tape to clearly mark and help you see high doorway thresholds. Trim any bushes or trees on the main path into your home. Check that handrails are securely fastened and in good repair. Both sides of all steps should have handrails. Install guardrails along the edges of any raised decks or porches. Have leaves, snow, and ice cleared regularly. Use sand, salt, or ice melt on walkways during winter months if you live where there is ice and snow. In the garage, clean up any spills right away, including grease or oil spills. What other actions can I take? Review your medicines with your health care provider. Some medicines can make you confused or feel dizzy. This can increase  your chance of falling. Wear closed-toe shoes that fit well and support your feet. Wear shoes that have rubber soles and low heels. Use a cane, walker, scooter, or crutches that help you move around if needed. Talk with your provider about other ways that you can decrease your risk of falls. This may include seeing a physical therapist to learn to do exercises to improve movement and strength. Where to find more information Centers for Disease Control and Prevention, STEADI: TonerPromos.no [employer] on Aging: BaseRingTones.pl National Institute on Aging: BaseRingTones.pl Contact a health care provider if: You are afraid of falling at home. You feel weak, drowsy, or dizzy at home. You fall at home. Get help right away if you: Lose consciousness or have trouble moving after a fall. Have a fall that causes a head injury. These symptoms may be an emergency. Get help right away. Call 911. Do not wait to see if the symptoms will go away. Do not drive yourself to the hospital. This information is not intended to replace advice given to you by your health care  provider. Make sure you discuss any questions you have with your health care provider. Document Revised: 08/01/2022 Document Reviewed: 08/01/2022 Elsevier Patient Education  2024 [employer].

## 2024-09-12 ENCOUNTER — Ambulatory Visit
Admission: RE | Admit: 2024-09-12 | Discharge: 2024-09-12 | Disposition: A | Discharge status: Home / Self Care | Source: Ambulatory Visit | Attending: Nurse Practitioner | Admitting: Nurse Practitioner

## 2024-09-12 ENCOUNTER — Ambulatory Visit: Payer: Self-pay | Admitting: Nurse Practitioner

## 2024-09-12 DIAGNOSIS — Z1231 Encounter for screening mammogram for malignant neoplasm of breast: Secondary | ICD-10-CM

## 2024-09-12 DIAGNOSIS — M85851 Other specified disorders of bone density and structure, right thigh: Secondary | ICD-10-CM | POA: Diagnosis not present

## 2024-09-17 ENCOUNTER — Ambulatory Visit: Payer: Self-pay | Admitting: Nurse Practitioner

## 2024-09-17 NOTE — Progress Notes (Signed)
 Contacted via MyChart   Normal mammogram, may repeat in one year:)

## 2024-10-06 NOTE — Patient Instructions (Incomplete)
 Be Involved in Caring For Your Health:  Taking Medications When medications are taken as directed, they can greatly improve your health. But if they are not taken as prescribed, they may not work. In some cases, not taking them correctly can be harmful. To help ensure your treatment remains effective and safe, understand your medications and how to take them. Bring your medications to each visit for review by your provider.  Your lab results, notes, and after visit summary will be available on My Chart. We strongly encourage you to use this feature. If lab results are abnormal the clinic will contact you with the appropriate steps. If the clinic does not contact you assume the results are satisfactory. You can always view your results on My Chart. If you have questions regarding your health or results, please contact the clinic during office hours. You can also ask questions on My Chart.  We at [facility] are grateful that you chose us  to provide your care. We strive to provide evidence-based and compassionate care and are always looking for feedback. If you get a survey from the clinic please complete this so we can hear your opinions.  Healthy Eating, Adult Healthy eating may help you get and keep a healthy body weight, reduce the risk of chronic disease, and live a long and productive life. It is important to follow a healthy eating pattern. Your nutritional and calorie needs should be met mainly by different nutrient-rich foods. What are tips for following this plan? Reading food labels Read labels and choose the following: Reduced or low sodium products. Juices with 100% fruit juice. Foods with low saturated fats (<3 g per serving) and high polyunsaturated and monounsaturated fats. Foods with whole grains, such as whole wheat, cracked wheat, brown rice, and wild rice. Whole grains that are fortified with folic acid. This is recommended for females who are pregnant or who want to  become pregnant. Read labels and do not eat or drink the following: Foods or drinks with added sugars. These include foods that contain brown sugar, corn sweetener, corn syrup, dextrose , fructose, glucose, high-fructose corn syrup, honey, invert sugar, lactose, malt syrup, maltose, molasses, raw sugar, sucrose, trehalose, or turbinado sugar. Limit your intake of added sugars to less than 10% of your total daily calories. Do not eat more than the following amounts of added sugar per day: 6 teaspoons (25 g) for females. 9 teaspoons (38 g) for males. Foods that contain processed or refined starches and grains. Refined grain products, such as white flour, degermed cornmeal, white bread, and white rice. Shopping Choose nutrient-rich snacks, such as vegetables, whole fruits, and nuts. Avoid high-calorie and high-sugar snacks, such as potato chips, fruit snacks, and candy. Use oil-based dressings and spreads on foods instead of solid fats such as butter, margarine, sour cream, or cream cheese. Limit pre-made sauces, mixes, and instant products such as flavored rice, instant noodles, and ready-made pasta. Try more plant-protein sources, such as tofu, tempeh, black beans, edamame, lentils, nuts, and seeds. Explore eating plans such as the Mediterranean diet or vegetarian diet. Try heart-healthy dips made with beans and healthy fats like hummus and guacamole. Vegetables go great with these. Cooking Use oil to saut or stir-fry foods instead of solid fats such as butter, margarine, or lard. Try baking, boiling, grilling, or broiling instead of frying. Remove the fatty part of meats before cooking. Steam vegetables in water  or broth. Meal planning  At meals, imagine dividing your plate into fourths: One-half of  your plate is fruits and vegetables. One-fourth of your plate is whole grains. One-fourth of your plate is protein, especially lean meats, poultry, eggs, tofu, beans, or nuts. Include low-fat  dairy as part of your daily diet. Lifestyle Choose healthy options in all settings, including home, work, school, restaurants, or stores. Prepare your food safely: Wash your hands after handling raw meats. Where you prepare food, keep surfaces clean by regularly washing with hot, soapy water . Keep raw meats separate from ready-to-eat foods, such as fruits and vegetables. Cook seafood, meat, poultry, and eggs to the recommended temperature. Get a food thermometer. Store foods at safe temperatures. In general: Keep cold foods at 84F (4.4C) or below. Keep hot foods at 184F (60C) or above. Keep your freezer at [facility] (-17.8C) or below. Foods are not safe to eat if they have been between the temperatures of 40-184F (4.4-60C) for more than 2 hours. What foods should I eat? Fruits Aim to eat 1-2 cups of fresh, canned (in natural juice), or frozen fruits each day. One cup of fruit equals 1 small apple, 1 large banana, 8 large strawberries, 1 cup (237 g) canned fruit,  cup (82 g) dried fruit, or 1 cup (240 mL) 100% juice. Vegetables Aim to eat 2-4 cups of fresh and frozen vegetables each day, including different varieties and colors. One cup of vegetables equals 1 cup (91 g) broccoli or cauliflower florets, 2 medium carrots, 2 cups (150 g) raw, leafy greens, 1 large tomato, 1 large bell pepper, 1 large sweet potato, or 1 medium white potato. Grains Aim to eat 5-10 ounce-equivalents of whole grains each day. Examples of 1 ounce-equivalent of grains include 1 slice of bread, 1 cup (40 g) ready-to-eat cereal, 3 cups (24 g) popcorn, or  cup (93 g) cooked rice. Meats and other proteins Try to eat 5-7 ounce-equivalents of protein each day. Examples of 1 ounce-equivalent of protein include 1 egg,  oz nuts (12 almonds, 24 pistachios, or 7 walnut halves), 1/4 cup (90 g) cooked beans, 6 tablespoons (90 g) hummus or 1 tablespoon (16 g) peanut butter. A cut of meat or fish that is the size of a deck of  cards is about 3-4 ounce-equivalents (85 g). Of the protein you eat each week, try to have at least 8 sounce (227 g) of seafood. This is about 2 servings per week. This includes salmon, trout, herring, sardines, and anchovies. Dairy Aim to eat 3 cup-equivalents of fat-free or low-fat dairy each day. Examples of 1 cup-equivalent of dairy include 1 cup (240 mL) milk, 8 ounces (250 g) yogurt, 1 ounces (44 g) natural cheese, or 1 cup (240 mL) fortified soy milk. Fats and oils Aim for about 5 teaspoons (21 g) of fats and oils per day. Choose monounsaturated fats, such as canola and olive oils, mayonnaise made with olive oil or avocado oil, avocados, peanut butter, and most nuts, or polyunsaturated fats, such as sunflower, corn, and soybean oils, walnuts, pine nuts, sesame seeds, sunflower seeds, and flaxseed. Beverages Aim for 6 eight-ounce glasses of water  per day. Limit coffee to 3-5 eight-ounce cups per day. Limit caffeinated beverages that have added calories, such as soda and energy drinks. If you drink alcohol: Limit how much you have to: 0-1 drink a day if you are female. 0-2 drinks a day if you are female. Know how much alcohol is in your drink. In the [place] one drink is one 12 oz bottle of beer (355 mL), one 5 oz glass of wine (  148 mL), or one 1 oz glass of hard liquor (44 mL). Seasoning and other foods Try not to add too much salt to your food. Try using herbs and spices instead of salt. Try not to add sugar to food. This information is based on [place] nutrition guidelines. To learn more, visit [employer]. Exact amounts may vary. You may need different amounts. This information is not intended to replace advice given to you by your health care provider. Make sure you discuss any questions you have with your health care provider. Document Revised: 08/29/2022 Document Reviewed: 08/29/2022 Elsevier Patient Education  2024 [employer].

## 2024-10-08 ENCOUNTER — Ambulatory Visit: Admitting: Nurse Practitioner

## 2024-10-08 DIAGNOSIS — I10 Essential (primary) hypertension: Secondary | ICD-10-CM

## 2024-10-08 DIAGNOSIS — H401231 Low-tension glaucoma, bilateral, mild stage: Secondary | ICD-10-CM

## 2024-10-08 DIAGNOSIS — E7800 Pure hypercholesterolemia, unspecified: Secondary | ICD-10-CM

## 2024-10-08 DIAGNOSIS — T466X5A Adverse effect of antihyperlipidemic and antiarteriosclerotic drugs, initial encounter: Secondary | ICD-10-CM

## 2024-10-08 DIAGNOSIS — M85851 Other specified disorders of bone density and structure, right thigh: Secondary | ICD-10-CM

## 2024-10-08 DIAGNOSIS — Z6829 Body mass index (BMI) 29.0-29.9, adult: Secondary | ICD-10-CM

## 2024-10-08 DIAGNOSIS — Z0000 Encounter for general adult medical examination without abnormal findings: Secondary | ICD-10-CM

## 2024-10-08 DIAGNOSIS — Z23 Encounter for immunization: Secondary | ICD-10-CM

## 2024-10-08 DIAGNOSIS — E559 Vitamin D deficiency, unspecified: Secondary | ICD-10-CM

## 2024-10-12 NOTE — Patient Instructions (Signed)
 Be Involved in Caring For Your Health:  Taking Medications When medications are taken as directed, they can greatly improve your health. But if they are not taken as prescribed, they may not work. In some cases, not taking them correctly can be harmful. To help ensure your treatment remains effective and safe, understand your medications and how to take them. Bring your medications to each visit for review by your provider.  Your lab results, notes, and after visit summary will be available on My Chart. We strongly encourage you to use this feature. If lab results are abnormal the clinic will contact you with the appropriate steps. If the clinic does not contact you assume the results are satisfactory. You can always view your results on My Chart. If you have questions regarding your health or results, please contact the clinic during office hours. You can also ask questions on My Chart.  We at [facility] are grateful that you chose [place] to provide your care. We strive to provide evidence-based and compassionate care and are always looking for feedback. If you get a survey from the clinic please complete this so we can hear your opinions.  Heart-Healthy Eating Plan Many factors influence your heart health, including eating and exercise habits. Heart health is also called coronary health. Coronary risk increases with abnormal blood fat (lipid) levels. A heart-healthy eating plan includes limiting unhealthy fats, increasing healthy fats, limiting salt (sodium) intake, and making other diet and lifestyle changes. What is my plan? Your health care provider may recommend that: You limit your fat intake to _________% or less of your total calories each day. You limit your saturated fat intake to _________% or less of your total calories each day. You limit the amount of cholesterol in your diet to less than _________ mg per day. You limit the amount of sodium in your diet to less than _________  mg per day. What are tips for following this plan? Cooking Cook foods using methods other than frying. Baking, boiling, grilling, and broiling are all good options. Other ways to reduce fat include: Removing the skin from poultry. Removing all visible fats from meats. Steaming vegetables in water or broth. Meal planning  At meals, imagine dividing your plate into fourths: Fill one-half of your plate with vegetables and green salads. Fill one-fourth of your plate with whole grains. Fill one-fourth of your plate with lean protein foods. Eat 2-4 cups of vegetables per day. One cup of vegetables equals 1 cup (91 g) broccoli or cauliflower florets, 2 medium carrots, 1 large bell pepper, 1 large sweet potato, 1 large tomato, 1 medium white potato, 2 cups (150 g) raw leafy greens. Eat 1-2 cups of fruit per day. One cup of fruit equals 1 small apple, 1 large banana, 1 cup (237 g) mixed fruit, 1 large orange,  cup (82 g) dried fruit, 1 cup (240 mL) 100% fruit juice. Eat more foods that contain soluble fiber. Examples include apples, broccoli, carrots, beans, peas, and barley. Aim to get 25-30 g of fiber per day. Increase your consumption of legumes, nuts, and seeds to 4-5 servings per week. One serving of dried beans or legumes equals  cup (90 g) cooked, 1 serving of nuts is  oz (12 almonds, 24 pistachios, or 7 walnut halves), and 1 serving of seeds equals  oz (8 g). Fats Choose healthy fats more often. Choose monounsaturated and polyunsaturated fats, such as olive and canola oils, avocado oil, flaxseeds, walnuts, almonds, and seeds. Eat  more omega-3 fats. Choose salmon, mackerel, sardines, tuna, flaxseed oil, and ground flaxseeds. Aim to eat fish at least 2 times each week. Check food labels carefully to identify foods with trans fats or high amounts of saturated fat. Limit saturated fats. These are found in animal products, such as meats, butter, and cream. Plant sources of saturated fats  include palm oil, palm kernel oil, and coconut oil. Avoid foods with partially hydrogenated oils in them. These contain trans fats. Examples are stick margarine, some tub margarines, cookies, crackers, and other baked goods. Avoid fried foods. General information Eat more home-cooked food and less restaurant, buffet, and fast food. Limit or avoid alcohol. Limit foods that are high in added sugar and simple starches such as foods made using white refined flour (white breads, pastries, sweets). Lose weight if you are overweight. Losing just 5-10% of your body weight can help your overall health and prevent diseases such as diabetes and heart disease. Monitor your sodium intake, especially if you have high blood pressure. Talk with your health care provider about your sodium intake. Try to incorporate more vegetarian meals weekly. What foods should I eat? Fruits All fresh, canned (in natural juice), or frozen fruits. Vegetables Fresh or frozen vegetables (raw, steamed, roasted, or grilled). Green salads. Grains Most grains. Choose whole wheat and whole grains most of the time. Rice and pasta, including brown rice and pastas made with whole wheat. Meats and other proteins Lean, well-trimmed beef, veal, pork, and lamb. Chicken and [place] without skin. All fish and shellfish. Wild duck, rabbit, pheasant, and venison. Egg whites or low-cholesterol egg substitutes. Dried beans, peas, lentils, and tofu. Seeds and most nuts. Dairy Low-fat or nonfat cheeses, including ricotta and mozzarella. Skim or 1% milk (liquid, powdered, or evaporated). Buttermilk made with low-fat milk. Nonfat or low-fat yogurt. Fats and oils Non-hydrogenated (trans-free) margarines. Vegetable oils, including soybean, sesame, sunflower, olive, avocado, peanut, safflower, corn, canola, and cottonseed. Salad dressings or mayonnaise made with a vegetable oil. Beverages Water (mineral or sparkling). Coffee and tea. Unsweetened ice  tea. Diet beverages. Sweets and desserts Sherbet, gelatin, and fruit ice. Small amounts of dark chocolate. Limit all sweets and desserts. Seasonings and condiments All seasonings and condiments. The items listed above may not be a complete list of foods and beverages you can eat. Contact a dietitian for more options. What foods should I avoid? Fruits Canned fruit in heavy syrup. Fruit in cream or butter sauce. Fried fruit. Limit coconut. Vegetables Vegetables cooked in cheese, cream, or butter sauce. Fried vegetables. Grains Breads made with saturated or trans fats, oils, or whole milk. Croissants. Sweet rolls. Donuts. High-fat crackers, such as cheese crackers and chips. Meats and other proteins Fatty meats, such as hot dogs, ribs, sausage, bacon, rib-eye roast or steak. High-fat deli meats, such as salami and bologna. Caviar. Domestic duck and goose. Organ meats, such as liver. Dairy Cream, sour cream, cream cheese, and creamed cottage cheese. Whole-milk cheeses. Whole or 2% milk (liquid, evaporated, or condensed). Whole buttermilk. Cream sauce or high-fat cheese sauce. Whole-milk yogurt. Fats and oils Meat fat, or shortening. Cocoa butter, hydrogenated oils, palm oil, coconut oil, palm kernel oil. Solid fats and shortenings, including bacon fat, salt pork, lard, and butter. Nondairy cream substitutes. Salad dressings with cheese or sour cream. Beverages Regular sodas and any drinks with added sugar. Sweets and desserts Frosting. Pudding. Cookies. Cakes. Pies. Milk chocolate or white chocolate. Buttered syrups. Full-fat ice cream or ice cream drinks. The items listed above may  not be a complete list of foods and beverages to avoid. Contact a dietitian for more information. Summary Heart-healthy meal planning includes limiting unhealthy fats, increasing healthy fats, limiting salt (sodium) intake and making other diet and lifestyle changes. Lose weight if you are overweight. Losing just  5-10% of your body weight can help your overall health and prevent diseases such as diabetes and heart disease. Focus on eating a balance of foods, including fruits and vegetables, low-fat or nonfat dairy, lean protein, nuts and legumes, whole grains, and heart-healthy oils and fats. This information is not intended to replace advice given to you by your health care provider. Make sure you discuss any questions you have with your health care provider. Document Revised: 01/03/2022 Document Reviewed: 01/03/2022 Elsevier Patient Education  2024 [employer].

## 2024-10-14 ENCOUNTER — Encounter: Payer: Self-pay | Admitting: Nurse Practitioner

## 2024-10-14 ENCOUNTER — Ambulatory Visit (INDEPENDENT_AMBULATORY_CARE_PROVIDER_SITE_OTHER): Admitting: Nurse Practitioner

## 2024-10-14 VITALS — BP 109/74 | HR 70 | Temp 97.7°F | Ht 64.0 in | Wt 172.4 lb

## 2024-10-14 DIAGNOSIS — I10 Essential (primary) hypertension: Secondary | ICD-10-CM | POA: Diagnosis not present

## 2024-10-14 DIAGNOSIS — M791 Myalgia, unspecified site: Secondary | ICD-10-CM

## 2024-10-14 DIAGNOSIS — M85851 Other specified disorders of bone density and structure, right thigh: Secondary | ICD-10-CM

## 2024-10-14 DIAGNOSIS — E7800 Pure hypercholesterolemia, unspecified: Secondary | ICD-10-CM | POA: Diagnosis not present

## 2024-10-14 DIAGNOSIS — Z6829 Body mass index (BMI) 29.0-29.9, adult: Secondary | ICD-10-CM

## 2024-10-14 DIAGNOSIS — T466X5A Adverse effect of antihyperlipidemic and antiarteriosclerotic drugs, initial encounter: Secondary | ICD-10-CM | POA: Diagnosis not present

## 2024-10-14 DIAGNOSIS — Z0000 Encounter for general adult medical examination without abnormal findings: Secondary | ICD-10-CM

## 2024-10-14 DIAGNOSIS — E559 Vitamin D deficiency, unspecified: Secondary | ICD-10-CM | POA: Diagnosis not present

## 2024-10-14 DIAGNOSIS — I340 Nonrheumatic mitral (valve) insufficiency: Secondary | ICD-10-CM | POA: Diagnosis not present

## 2024-10-14 DIAGNOSIS — Z23 Encounter for immunization: Secondary | ICD-10-CM

## 2024-10-14 MED ORDER — REPATHA 140 MG/ML ~~LOC~~ SOSY: 140.0000 mg | PREFILLED_SYRINGE | SUBCUTANEOUS | 4 refills | Status: AC

## 2024-10-14 NOTE — Addendum Note (Signed)
 Addended by: Kellin Bartling T on: 10/14/2024 08:31 AM   Modules accepted: Orders

## 2024-10-14 NOTE — Assessment & Plan Note (Signed)
Chronic, ongoing with Repatha -- tolerates this well.  LDL last visit remains stable.  Praised for success and recommend continuing Repatha, will send refills as needed.  Return in 6 months. Lipid panel today. Could consider addition of Zetia if LDL needs tighter control.

## 2024-10-14 NOTE — Progress Notes (Signed)
 BP 109/74   Pulse 70   Temp 97.7 F (36.5 C) (Oral)   Ht 5' 4 (1.626 m)   Wt 172 lb 6 oz (78.2 kg)   LMP  (LMP Unknown)   SpO2 99%   BMI 29.59 kg/m    Subjective:    Patient ID: Janet Taylor, female    DOB: 01-05-1946, 78 y.o.   MRN: [id number]  HPI: Janet Taylor is a 78 y.o. female presenting on 10/14/2024 for comprehensive medical examination. Current medical complaints include:none  She currently lives with: self Menopausal Symptoms: no  HYPERTENSION / HYPERLIPIDEMIA Continues HCTZ, Metoprolol , and Repatha . Saw cardiology in 08/16/22, no changes made.  Last echo 08/19/21 showed EF >55%.  Repatha  has been offering benefit to LDL. Duration of hypertension: chronic BP monitoring frequency: 2-3 times a week BP range: 100-110/70 range BP medication side effects: no Duration of hyperlipidemia: chronic Cholesterol medication side effects: no Cholesterol supplements: fish oil Medication compliance: good compliance Aspirin : no Recent stressors: no Recurrent headaches: no Visual changes: no Palpitations: no Dyspnea: no Chest pain: no Lower extremity edema: no Dizzy/lightheaded: no   OSTEOPENIA Recent repeat DEXA noted ongoing osteopenia with T-score -1.3. Taking supplements daily.   Satisfied with current treatment?: yes Adequate calcium  & vitamin D : yes Weight bearing exercises: yes  Depression Screen done today and results listed below:     10/14/2024    8:08 AM 09/10/2024    8:40 AM 04/03/2024   10:09 AM 10/04/2023    9:43 AM 09/05/2023    9:55 AM  Depression screen PHQ 2/9  Decreased Interest 0 0 0 0 0  Down, Depressed, Hopeless 0 0 0 0 0  PHQ - 2 Score 0 0 0 0 0  Altered sleeping 0 0 0 0 0  Tired, decreased energy 0 0 0 0 0  Change in appetite 0 0 0 0 0  Feeling bad or failure about yourself  0 0 0 0 0  Trouble concentrating 0 0 0 0 0  Moving slowly or fidgety/restless 0 0 0 0 0  Suicidal thoughts  0 0 0 0  PHQ-9 Score 0 0 0 0 0  Difficult doing  work/chores Not difficult at all Not difficult at all Not difficult at all Not difficult at all Not difficult at all      10/14/2024    8:10 AM 04/03/2024   10:09 AM 10/04/2023    9:43 AM 10/05/2022   10:05 AM  GAD 7 : Generalized Anxiety Score  Nervous, Anxious, on Edge 0 0 0 0  Control/stop worrying 0 0 0 0  Worry too much - different things 0 0 0 0  Trouble relaxing 0 0 0 0  Restless 0 0 0 0  Easily annoyed or irritable 0 0 0 0  Afraid - awful might happen 0 0 0 0  Total GAD 7 Score 0 0 0 0  Anxiety Difficulty Not difficult at all Not difficult at all Not difficult at all Not difficult at all       09/05/2023    9:58 AM 10/04/2023    9:42 AM 04/03/2024   10:08 AM 09/05/2024   12:48 PM 09/10/2024    8:43 AM  Fall Risk  Falls in the past year? 0 0 0 0 0  Was there an injury with Fall? 0 0 0 0 0  Fall Risk Category Calculator 0 0 0 0  0  Patient at Risk for Falls Due to No Fall Risks No  Fall Risks No Fall Risks  No Fall Risks  Fall risk Follow up Falls prevention discussed Falls evaluation completed Falls evaluation completed  Falls evaluation completed     Patient-reported    Functional Status Survey: Is the patient deaf or have difficulty hearing?: No Does the patient have difficulty seeing, even when wearing glasses/contacts?: No Does the patient have difficulty concentrating, remembering, or making decisions?: No Does the patient have difficulty walking or climbing stairs?: No Does the patient have difficulty dressing or bathing?: No Does the patient have difficulty doing errands alone such as visiting a doctor's office or shopping?: No   Past Medical History:  Past Medical History:  Diagnosis Date   Allergy    Eczema herpeticum    Glaucoma    Heart murmur    Hyperlipidemia    Hypertension    Menopausal disorder    Osteopenia     Surgical History:  Past Surgical History:  Procedure Laterality Date   ABDOMINAL HYSTERECTOMY     complete   BREAST BIOPSY Right  2012   NEG   BREAST BIOPSY Right 09/16/2020   stereo bx of calcs, x marker, path pending   CATARACT EXTRACTION Right 10/03/2016   cataract surgery Left    09/12/16   COLONOSCOPY WITH PROPOFOL  N/A 10/02/2017   Procedure: COLONOSCOPY WITH PROPOFOL ;  Surgeon: Therisa Bi, MD;  Location: [facility] ENDOSCOPY;  Service: Gastroenterology;  Laterality: N/A;   EYE SURGERY Bilateral 09/12/2016   bilateral cataract surgery    EYE SURGERY  10/03/2016    Medications:  Current Outpatient Medications on File Prior to Visit  Medication Sig   Evolocumab  (REPATHA ) 140 MG/ML SOSY Inject 140 mg into the skin every 14 (fourteen) days.   hydrochlorothiazide  (HYDRODIURIL ) 25 MG tablet Take 1 tablet (25 mg total) by mouth daily.   hydrocortisone  2.5 % cream APPLY TO AFFECTED AREA TWICE A DAY   latanoprost (XALATAN) 0.005 % ophthalmic solution 1 drop at bedtime.   metoprolol  tartrate (LOPRESSOR ) 50 MG tablet Take 1 tablet (50 mg total) by mouth daily.   No current facility-administered medications on file prior to visit.    Allergies:  Allergies  Allergen Reactions   Hydrocodone Nausea Only   Lipitor [Atorvastatin] Other (See Comments)    muscle aches   Morphine And Codeine Nausea Only   Rosuvastatin  Other (See Comments)   Zetia [Ezetimibe] Other (See Comments)    flu symptoms   Social History:  Social History   Socioeconomic History   Marital status: Divorced    Spouse name: Not on file   Number of children: 0   Years of education: Not on file   Highest education level: Bachelor's degree (e.g., BA, AB, BS)  Occupational History   Occupation: retired    Comment: scientist, water quality  Tobacco Use   Smoking status: Never   Smokeless tobacco: Never  Vaping Use   Vaping status: Never Used  Substance and Sexual Activity   Alcohol use: Yes    Alcohol/week: 3.0 standard drinks of alcohol    Types: 3 Glasses of wine per week    Comment: on weekends   Drug use: No   Sexual activity: Not Currently   Other Topics Concern   Not on file  Social History Narrative   Walks outside for 30 minutes 3 days a week, works outside in yard   Social Drivers of Corporate Investment Banker Strain: Low Risk  (10/13/2024)   Overall Financial Resource Strain (CARDIA)    Difficulty of  Paying Living Expenses: Not hard at all  Food Insecurity: No Food Insecurity (10/13/2024)   Hunger Vital Sign    Worried About Running Out of Food in the Last Year: Never true    Ran Out of Food in the Last Year: Never true  Transportation Needs: No Transportation Needs (10/13/2024)   PRAPARE - Administrator, Civil Service (Medical): No    Lack of Transportation (Non-Medical): No  Physical Activity: Insufficiently Active (10/13/2024)   Exercise Vital Sign    Days of Exercise per Week: 3 days    Minutes of Exercise per Session: 20 min  Stress: No Stress Concern Present (10/13/2024)   [employer] of Occupational Health - Occupational Stress Questionnaire    Feeling of Stress: Not at all  Social Connections: Moderately Integrated (10/13/2024)   Social Connection and Isolation Panel    Frequency of Communication with Friends and Family: More than three times a week    Frequency of Social Gatherings with Friends and Family: Three times a week    Attends Religious Services: More than 4 times per year    Active Member of Clubs or Organizations: Yes    Attends Banker Meetings: More than 4 times per year    Marital Status: Divorced  Intimate Partner Violence: Not At Risk (09/10/2024)   Humiliation, Afraid, Rape, and Kick questionnaire    Fear of Current or Ex-Partner: No    Emotionally Abused: No    Physically Abused: No    Sexually Abused: No   Social History   Tobacco Use  Smoking Status Never  Smokeless Tobacco Never   Social History   Substance and Sexual Activity  Alcohol Use Yes   Alcohol/week: 3.0 standard drinks of alcohol   Types: 3 Glasses of wine per week   Comment: on  weekends    Family History:  Family History  Problem Relation Age of Onset   Alzheimer's disease Mother    Hypertension Father    Hypertension Sister    Breast cancer Neg Hx     Past medical history, surgical history, medications, allergies, family history and social history reviewed with patient today and changes made to appropriate areas of the chart.   ROS All other ROS negative except what is listed above and in the HPI.      Objective:    BP 109/74   Pulse 70   Temp 97.7 F (36.5 C) (Oral)   Ht 5' 4 (1.626 m)   Wt 172 lb 6 oz (78.2 kg)   LMP  (LMP Unknown)   SpO2 99%   BMI 29.59 kg/m   Wt Readings from Last 3 Encounters:  10/14/24 172 lb 6 oz (78.2 kg)  09/10/24 165 lb (74.8 kg)  04/03/24 172 lb 12.8 oz (78.4 kg)    Physical Exam Vitals and nursing note reviewed. Exam conducted with a chaperone present.  Constitutional:      General: She is awake. She is not in acute distress.    Appearance: She is well-developed and well-groomed. She is not ill-appearing or toxic-appearing.  HENT:     Head: Normocephalic and atraumatic.     Right Ear: Hearing, tympanic membrane, ear canal and external ear normal. No drainage.     Left Ear: Hearing, tympanic membrane, ear canal and external ear normal. No drainage.     Nose: Nose normal.     Right Sinus: No maxillary sinus tenderness or frontal sinus tenderness.     Left Sinus: No  maxillary sinus tenderness or frontal sinus tenderness.     Mouth/Throat:     Mouth: Mucous membranes are moist.     Pharynx: Oropharynx is clear. Uvula midline. No pharyngeal swelling, oropharyngeal exudate or posterior oropharyngeal erythema.  Eyes:     General: Lids are normal.        Right eye: No discharge.        Left eye: No discharge.     Extraocular Movements: Extraocular movements intact.     Conjunctiva/sclera: Conjunctivae normal.     Pupils: Pupils are equal, round, and reactive to light.     Visual Fields: Right eye visual fields  normal and left eye visual fields normal.  Neck:     Thyroid : No thyromegaly.     Vascular: No carotid bruit.     Trachea: Trachea normal.  Cardiovascular:     Rate and Rhythm: Normal rate and regular rhythm.     Heart sounds: Murmur heard.     Systolic murmur is present with a grade of 2/6.     No gallop.  Pulmonary:     Effort: Pulmonary effort is normal. No accessory muscle usage or respiratory distress.     Breath sounds: Normal breath sounds.  Chest:  Breasts:    Right: Normal.     Left: Normal.  Abdominal:     General: Bowel sounds are normal.     Palpations: Abdomen is soft. There is no hepatomegaly or splenomegaly.     Tenderness: There is no abdominal tenderness.  Musculoskeletal:        General: Normal range of motion.     Cervical back: Normal range of motion and neck supple.     Right lower leg: No edema.     Left lower leg: No edema.  Lymphadenopathy:     Head:     Right side of head: No submental, submandibular, tonsillar, preauricular or posterior auricular adenopathy.     Left side of head: No submental, submandibular, tonsillar, preauricular or posterior auricular adenopathy.     Cervical: No cervical adenopathy.     Upper Body:     Right upper body: No supraclavicular, axillary or pectoral adenopathy.     Left upper body: No supraclavicular, axillary or pectoral adenopathy.  Skin:    General: Skin is warm and dry.     Capillary Refill: Capillary refill takes less than 2 seconds.     Findings: No rash.  Neurological:     Mental Status: She is alert and oriented to person, place, and time.     Gait: Gait is intact.     Deep Tendon Reflexes: Reflexes are normal and symmetric.     Reflex Scores:      Brachioradialis reflexes are 2+ on the right side and 2+ on the left side.      Patellar reflexes are 2+ on the right side and 2+ on the left side. Psychiatric:        Attention and Perception: Attention normal.        Mood and Affect: Mood normal.         Speech: Speech normal.        Behavior: Behavior normal. Behavior is cooperative.        Thought Content: Thought content normal.        Judgment: Judgment normal.    Results for orders placed or performed in visit on 04/03/24  Comprehensive metabolic panel with GFR   Collection Time: 04/03/24 10:33 AM  Result Value  Ref Range   Glucose 78 70 - 99 mg/dL   BUN 11 8 - 27 mg/dL   Creatinine, Ser 9.28 0.57 - 1.00 mg/dL   eGFR 88 >40 fO/fpw/8.26   BUN/Creatinine Ratio 15 12 - 28   Sodium 143 134 - 144 mmol/L   Potassium 3.5 3.5 - 5.2 mmol/L   Chloride 103 96 - 106 mmol/L   CO2 27 20 - 29 mmol/L   Calcium  9.3 8.7 - 10.3 mg/dL   Total Protein 6.2 6.0 - 8.5 g/dL   Albumin 4.1 3.8 - 4.8 g/dL   Globulin, Total 2.1 1.5 - 4.5 g/dL   Bilirubin Total 0.2 0.0 - 1.2 mg/dL   Alkaline Phosphatase 58 44 - 121 IU/L   AST 15 0 - 40 IU/L   ALT 19 0 - 32 IU/L  Lipid Panel w/o Chol/HDL Ratio   Collection Time: 04/03/24 10:33 AM  Result Value Ref Range   Cholesterol, Total 161 100 - 199 mg/dL   Triglycerides 90 0 - 149 mg/dL   HDL 68 >60 mg/dL   VLDL Cholesterol Cal 17 5 - 40 mg/dL   LDL Chol Calc (NIH) 76 0 - 99 mg/dL  Microalbumin, Urine Waived   Collection Time: 04/03/24 10:33 AM  Result Value Ref Range   Microalb, Ur Waived 80 (H) 0 - 19 mg/L   Creatinine, Urine Waived 100 10 - 300 mg/dL   Microalb/Creat Ratio <30 <30 mg/g      Assessment & Plan:   Problem List Items Addressed This Visit       Cardiovascular and Mediastinum   Mild mitral regurgitation by prior echocardiogram   Stable.  Noted on echo, continue to monitor.  No symptoms at this time.      Hypertension - Primary   Chronic, stable.  BP well below goal in office and at home.  Continue daily BP checks at home and current medication regimen + collaboration with cardiology as needed.  DASH diet focus.  LABS: CMP and urine ALB.  Urine ALB 80 April 2025, discussed with her we could consider adding a low dose ACE or ARB in  future, but would monitor closely as to not cause hypotension.  Refills sent.  Return in 6 months.      Relevant Orders   CBC with Differential/Platelet   Comprehensive metabolic panel with GFR   TSH     Musculoskeletal and Integument   Osteopenia of neck of right femur   On DEXA 2020 and 2025.  Recommend continued supplement daily, Vit D and calcium  + weight bearing exercises.  Repeat DEXA in 2030.  Check Vit D level annually.      Relevant Orders   VITAMIN D  25 Hydroxy (Vit-D Deficiency, Fractures)     Other   Vitamin D  deficiency   Ongoing, stable.  Continue daily supplement.  Vitamin D  level today.      Relevant Orders   VITAMIN D  25 Hydroxy (Vit-D Deficiency, Fractures)   Myalgia due to statin   Tolerating Repatha  with improved LDL, continue this regimen and adjust as needed.      Hyperlipidemia   Chronic, ongoing with Repatha  -- tolerates this well.  LDL last visit remains stable.  Praised for success and recommend continuing Repatha , will send refills as needed.  Return in 6 months. Lipid panel today. Could consider addition of Zetia if LDL needs tighter control.      Relevant Orders   Comprehensive metabolic panel with GFR   Lipid Panel w/o Chol/HDL  Ratio   BMI 29.0-29.9,adult   BMI 29.59.  Recommended eating smaller high protein, low fat meals more frequently and exercising 30 mins a day 5 times a week with a goal of 10-15lb weight loss in the next 3 months. Patient voiced their understanding and motivation to adhere to these recommendations.       Other Visit Diagnoses       Encounter for annual physical exam       Annual physical today with labs and health maintenance reviewed, discussed with patient.        Follow up plan: Return in about 6 months (around 04/13/2025) for HTN/HLD.   LABORATORY TESTING:  - Pap smear: not applicable  IMMUNIZATIONS:   - Tdap: Tetanus vaccination status reviewed: Refused, not covered by insurance - Influenza: Will get in  future when does not have as much going on - Pneumovax: Up to date - Prevnar: Up to date - COVID: Up to date - HPV: Not applicable - Shingrix vaccine: had older version, not the new ones  SCREENING: -Mammogram: Up to date  - Colonoscopy: Not applicable  - Bone Density: Up to date  -Hearing Test: Not applicable  -Spirometry: Not applicable   PATIENT COUNSELING:   Advised to take 1 mg of folate supplement per day if capable of pregnancy.   Sexuality: Discussed sexually transmitted diseases, partner selection, use of condoms, avoidance of unintended pregnancy  and contraceptive alternatives.   Advised to avoid cigarette smoking.  I discussed with the patient that most people either abstain from alcohol or drink within safe limits (<=14/week and <=4 drinks/occasion for males, <=7/weeks and <= 3 drinks/occasion for females) and that the risk for alcohol disorders and other health effects rises proportionally with the number of drinks per week and how often a drinker exceeds daily limits.  Discussed cessation/primary prevention of drug use and availability of treatment for abuse.   Diet: Encouraged to adjust caloric intake to maintain  or achieve ideal body weight, to reduce intake of dietary saturated fat and total fat, to limit sodium intake by avoiding high sodium foods and not adding table salt, and to maintain adequate dietary potassium and calcium  preferably from fresh fruits, vegetables, and low-fat dairy products.    Stressed the importance of regular exercise  Injury prevention: Discussed safety belts, safety helmets, smoke detector, smoking near bedding or upholstery.   Dental health: Discussed importance of regular tooth brushing, flossing, and dental visits.    NEXT PREVENTATIVE PHYSICAL DUE IN 1 YEAR. Return in about 6 months (around 04/13/2025) for HTN/HLD.

## 2024-10-14 NOTE — Assessment & Plan Note (Signed)
Ongoing, stable.  Continue daily supplement.  Vitamin D level today.

## 2024-10-14 NOTE — Assessment & Plan Note (Signed)
 On DEXA 2020 and 2025.  Recommend continued supplement daily, Vit D and calcium  + weight bearing exercises.  Repeat DEXA in 2030.  Check Vit D level annually.

## 2024-10-14 NOTE — Assessment & Plan Note (Signed)
Stable.  Noted on echo, continue to monitor.  No symptoms at this time.

## 2024-10-14 NOTE — Assessment & Plan Note (Signed)
Tolerating Repatha with improved LDL, continue this regimen and adjust as needed. 

## 2024-10-14 NOTE — Assessment & Plan Note (Signed)
BMI 29.59.  Recommended eating smaller high protein, low fat meals more frequently and exercising 30 mins a day 5 times a week with a goal of 10-15lb weight loss in the next 3 months. Patient voiced their understanding and motivation to adhere to these recommendations.  

## 2024-10-14 NOTE — Assessment & Plan Note (Signed)
 Chronic, stable.  BP well below goal in office and at home.  Continue daily BP checks at home and current medication regimen + collaboration with cardiology as needed.  DASH diet focus.  LABS: CMP and urine ALB.  Urine ALB 80 April 2025, discussed with her we could consider adding a low dose ACE or ARB in future, but would monitor closely as to not cause hypotension.  Refills sent.  Return in 6 months.

## 2024-10-15 ENCOUNTER — Ambulatory Visit: Payer: Self-pay | Admitting: Nurse Practitioner

## 2024-10-15 LAB — LIPID PANEL W/O CHOL/HDL RATIO
Cholesterol, Total: 149 mg/dL (ref 100–199)
HDL: 63 mg/dL (ref >39)
LDL Chol Calc (NIH): 66 mg/dL (ref 0–99)
Triglycerides: 109 mg/dL (ref 0–149)
VLDL Cholesterol Cal: 20 mg/dL (ref 5–40)

## 2024-10-15 LAB — CBC WITH DIFFERENTIAL/PLATELET
Basophils Absolute: 0.1 x10E3/uL (ref 0.0–0.2)
Basos: 1 %
EOS (ABSOLUTE): 0.5 x10E3/uL — ABNORMAL HIGH (ref 0.0–0.4)
Eos: 7 %
Hematocrit: 47.6 % — ABNORMAL HIGH (ref 34.0–46.6)
Hemoglobin: 14.7 g/dL (ref 11.1–15.9)
Immature Grans (Abs): 0 x10E3/uL (ref 0.0–0.1)
Immature Granulocytes: 0 %
Lymphocytes Absolute: 2.6 x10E3/uL (ref 0.7–3.1)
Lymphs: 38 %
MCH: 29.8 pg (ref 26.6–33.0)
MCHC: 30.9 g/dL — ABNORMAL LOW (ref 31.5–35.7)
MCV: 96 fL (ref 79–97)
Monocytes Absolute: 0.5 x10E3/uL (ref 0.1–0.9)
Monocytes: 7 %
Neutrophils Absolute: 3.2 x10E3/uL (ref 1.4–7.0)
Neutrophils: 47 %
Platelets: 321 x10E3/uL (ref 150–450)
RBC: 4.94 x10E6/uL (ref 3.77–5.28)
RDW: 13.8 % (ref 11.7–15.4)
WBC: 6.9 x10E3/uL (ref 3.4–10.8)

## 2024-10-15 LAB — COMPREHENSIVE METABOLIC PANEL WITH GFR
ALT: 23 IU/L (ref 0–32)
AST: 20 IU/L (ref 0–40)
Albumin: 4.1 g/dL (ref 3.8–4.8)
Alkaline Phosphatase: 66 IU/L (ref 49–135)
BUN/Creatinine Ratio: 13 (ref 12–28)
BUN: 9 mg/dL (ref 8–27)
Bilirubin Total: 0.3 mg/dL (ref 0.0–1.2)
CO2: 26 mmol/L (ref 20–29)
Calcium: 9.5 mg/dL (ref 8.7–10.3)
Chloride: 98 mmol/L (ref 96–106)
Creatinine, Ser: 0.67 mg/dL (ref 0.57–1.00)
Globulin, Total: 2.1 g/dL (ref 1.5–4.5)
Glucose: 83 mg/dL (ref 70–99)
Potassium: 3.8 mmol/L (ref 3.5–5.2)
Sodium: 138 mmol/L (ref 134–144)
Total Protein: 6.2 g/dL (ref 6.0–8.5)
eGFR: 90 mL/min/1.73 (ref >59)

## 2024-10-15 LAB — TSH: TSH: 0.919 u[IU]/mL (ref 0.450–4.500)

## 2024-10-15 LAB — VITAMIN D 25 HYDROXY (VIT D DEFICIENCY, FRACTURES): Vit D, 25-Hydroxy: 52.3 ng/mL (ref 30.0–100.0)

## 2024-10-15 NOTE — Progress Notes (Signed)
 Contacted via MyChart  Good afternoon Janet Taylor, your labs have returned and overall remain stable. This includes lipid panel which continues to do well with Repatha  on board.  Continue all current medications.  Any questions? Keep being amazing!!  Thank you for allowing me to participate in your care.  I appreciate you. Kindest regards, Demarie Hyneman

## 2024-11-27 ENCOUNTER — Telehealth: Payer: Self-pay | Admitting: Nurse Practitioner

## 2024-11-27 DIAGNOSIS — L989 Disorder of the skin and subcutaneous tissue, unspecified: Secondary | ICD-10-CM

## 2024-11-27 NOTE — Telephone Encounter (Signed)
 Patient requesting a dermatology referral for a mole removal.

## 2024-11-27 NOTE — Telephone Encounter (Signed)
 Copied from CRM #[id number]. Topic: Referral - Request for Referral >> Nov 27, 2024 10:54 AM Travis F wrote: Did the patient discuss referral with their provider in the last year? No  Appointment offered? Yes  Type of order/referral and detailed reason for visit: Dermatologist   Preference of office, provider, location: Provider's choice.   If referral order, have you been seen by this specialty before? No   Patient has a mole on her back where the bra hooks and it needs to be removed   Can we respond through MyChart? No

## 2024-11-28 ENCOUNTER — Telehealth: Payer: Self-pay | Admitting: Nurse Practitioner

## 2024-11-28 NOTE — Telephone Encounter (Unsigned)
 Copied from CRM #[id number]. Topic: Appointments - Scheduling Inquiry for Clinic >> Nov 28, 2024  3:57 PM Janet Taylor wrote: Reason for CRM: Pt called requesting to know if her PCP wants her to come in for her appt tomorrow or not? Requesting a call back.  Best contact: [phone]

## 2024-11-29 ENCOUNTER — Ambulatory Visit: Admitting: Nurse Practitioner

## 2024-11-29 ENCOUNTER — Encounter: Payer: Self-pay | Admitting: Nurse Practitioner

## 2024-11-29 VITALS — BP 123/83 | HR 75 | Temp 97.5°F | Resp 16 | Ht 64.02 in | Wt 174.2 lb

## 2024-11-29 DIAGNOSIS — Z23 Encounter for immunization: Secondary | ICD-10-CM

## 2024-11-29 DIAGNOSIS — L989 Disorder of the skin and subcutaneous tissue, unspecified: Secondary | ICD-10-CM | POA: Insufficient documentation

## 2024-11-29 NOTE — Telephone Encounter (Signed)
Patient seen in visit

## 2024-11-29 NOTE — Assessment & Plan Note (Signed)
 Scheduled to see dermatology in January for removal and full skin assessment, which she requested. Appears benign, skin tag. Discussed with patient today.

## 2024-11-29 NOTE — Patient Instructions (Signed)
Mole A mole is a colored (pigmented) growth on the skin. Moles are very common. They are usually harmless, but some moles can become cancerous over time. What are the causes? Moles are caused when pigmented skin cells grow together in clusters instead of spreading out in the skin as they normally do. The reason why the skin cells grow together in clusters is not known. What increases the risk? You are more likely to develop a mole if: You have family members who have moles. You are fair skinned. You have red or blond hair. You are often outdoors and exposed to the sun. You received phototherapy when you were a newborn baby. You are female. What are the signs or symptoms? A mole may occur anywhere on your skin. A mole may be: [place] or another color. Although moles are most often brown, they can also be tan, black, red, pink, blue, skin-toned, or colorless. Flat or raised. Smooth or wrinkled. Round in shape. How is this diagnosed? A mole is diagnosed with a skin exam. If your health care provider thinks a mole may be cancerous, all or part of the mole will be removed for testing (biopsy). How is this treated? Most moles are noncancerous (benign) and do not require treatment. If a mole is found to be cancerous, it will be removed. You may also choose to have a mole removed if it is causing pain or if you do not like the way it looks. Follow these instructions at home: General instructions  Every month, look for new moles and check your existing moles for changes. This is important because a change in a mole can mean that the mole has become cancerous. ABCDE changes in a mole indicate that you should be evaluated by your health care provider. ABCDE stands for: Asymmetry. This means the mole has an irregular shape. It is not round or oval. Border. This means the mole has an irregular or bumpy border. Color. This means the mole has multiple colors in it, including brown, black, blue, red, or  tan. Note that it is normal for moles to get darker when a woman is pregnant or takes birth control pills. Diameter. This means the mole is more than 0.2 inches (6 mm) across. Evolving. This refers to any unusual changes or symptoms in the mole, such as pain, itching, stinging, sensitivity, or bleeding. If you have a large number of moles, see a skin doctor (dermatologist) at least one time every year for a full-body skin check. Lifestyle  When you are outdoors, wear sunscreen with SPF 30 (sun protection factor 30) or higher. Use an adequate amount of sunscreen to cover exposed areas of skin. Put it on 30 minutes before you go out. Reapply it every 2 hours or anytime you come out of the water. When you are out in the sun, wear a broad-brimmed hat and clothing that covers your arms and legs. Wear wraparound sunglasses. Contact a health care provider if: The size, shape, borders, or color of your mole changes. Your mole, or the skin near the mole, becomes painful, sore, red, or swollen. Your mole: Develops more than one color. Itches or bleeds. Becomes scaly, sheds skin, or oozes fluid. Becomes flat or develops raised areas. Becomes hard or soft. You develop a new mole. Summary A mole is a colored (pigmented) growth on the skin. Moles are very common. They are usually harmless, but some moles can become cancerous over time. Every month, look for new moles and check your  existing moles for changes. This is important because a change in a mole can mean that the mole has become cancerous. If you have a large number of moles, see a skin doctor (dermatologist) at least one time every year for a full-body skin check. When you are outdoors, wear sunscreen with SPF 30 (sun protection factor 30) or higher. Reapply it every 2 hours or anytime you come out of the water. Contact a health care provider if you notice changes in a mole or if you develop a new mole. This information is not intended to replace  advice given to you by your health care provider. Make sure you discuss any questions you have with your health care provider. Document Revised: 08/19/2021 Document Reviewed: 08/19/2021 Elsevier Patient Education  2024 [employer].

## 2024-11-29 NOTE — Progress Notes (Signed)
 "  BP 123/83 (BP Location: Left Arm, Patient Position: Sitting, Cuff Size: Normal)   Pulse 75   Temp (!) 97.5 F (36.4 C) (Oral)   Resp 16   Ht 5' 4.02 (1.626 m)   Wt 174 lb 3.2 oz (79 kg)   LMP  (LMP Unknown)   SpO2 98%   BMI 29.89 kg/m    Subjective:    Patient ID: Janet Taylor, female    DOB: 1946-09-04, 78 y.o.   MRN: [id number]  HPI: Janet Taylor is a 78 y.o. female  Chief Complaint  Patient presents with   Referral    Mole on back was told to come in to be seen to get a referral to dermatology.   SKIN LESION Present for awhile, it is irritating at bra line. Duration: years Location: back, bra line Painful: no Itching: no Onset: gradual Context: changing Associated signs and symptoms:  History of skin cancer: no History of precancerous skin lesions: no Family history of skin cancer: no   Relevant past medical, surgical, family and social history reviewed and updated as indicated. Interim medical history since our last visit reviewed. Allergies and medications reviewed and updated.  Review of Systems  Constitutional:  Negative for activity change, appetite change, diaphoresis and fatigue.  Respiratory:  Negative for cough, chest tightness, shortness of breath and wheezing.   Cardiovascular:  Negative for chest pain, palpitations and leg swelling.  Neurological: Negative.   Psychiatric/Behavioral: Negative.      Per HPI unless specifically indicated above     Objective:    BP 123/83 (BP Location: Left Arm, Patient Position: Sitting, Cuff Size: Normal)   Pulse 75   Temp (!) 97.5 F (36.4 C) (Oral)   Resp 16   Ht 5' 4.02 (1.626 m)   Wt 174 lb 3.2 oz (79 kg)   LMP  (LMP Unknown)   SpO2 98%   BMI 29.89 kg/m   Wt Readings from Last 3 Encounters:  11/29/24 174 lb 3.2 oz (79 kg)  10/14/24 172 lb 6 oz (78.2 kg)  09/10/24 165 lb (74.8 kg)    Physical Exam Vitals and nursing note reviewed.  Constitutional:      General: She is awake. She is not in  acute distress.    Appearance: She is well-developed and overweight. She is not ill-appearing.  HENT:     Head: Normocephalic.     Right Ear: Hearing normal.     Left Ear: Hearing normal.  Eyes:     General: Lids are normal.        Right eye: No discharge.        Left eye: No discharge.     Conjunctiva/sclera: Conjunctivae normal.     Pupils: Pupils are equal, round, and reactive to light.  Neck:     Thyroid : No thyromegaly.     Vascular: No carotid bruit.  Cardiovascular:     Rate and Rhythm: Normal rate and regular rhythm.     Heart sounds: Normal heart sounds. No murmur heard.    No gallop.  Pulmonary:     Effort: Pulmonary effort is normal. No accessory muscle usage or respiratory distress.     Breath sounds: Normal breath sounds.  Abdominal:     General: Bowel sounds are normal.     Palpations: Abdomen is soft. There is no hepatomegaly or splenomegaly.  Musculoskeletal:     Cervical back: Normal range of motion and neck supple.     Right lower leg:  No edema.     Left lower leg: No edema.  Skin:    General: Skin is warm and dry.     Findings: Lesion present.      Neurological:     Mental Status: She is alert and oriented to person, place, and time.  Psychiatric:        Attention and Perception: Attention normal.        Mood and Affect: Mood normal.        Speech: Speech normal.        Behavior: Behavior normal. Behavior is cooperative.        Thought Content: Thought content normal.     Results for orders placed or performed in visit on 10/14/24  CBC with Differential/Platelet   Collection Time: 10/14/24  8:28 AM  Result Value Ref Range   WBC 6.9 3.4 - 10.8 x10E3/uL   RBC 4.94 3.77 - 5.28 x10E6/uL   Hemoglobin 14.7 11.1 - 15.9 g/dL   Hematocrit 52.3 (H) 65.9 - 46.6 %   MCV 96 79 - 97 fL   MCH 29.8 26.6 - 33.0 pg   MCHC 30.9 (L) 31.5 - 35.7 g/dL   RDW 86.1 88.2 - 84.5 %   Platelets 321 150 - 450 x10E3/uL   Neutrophils 47 Not Estab. %   Lymphs 38 Not Estab.  %   Monocytes 7 Not Estab. %   Eos 7 Not Estab. %   Basos 1 Not Estab. %   Neutrophils Absolute 3.2 1.4 - 7.0 x10E3/uL   Lymphocytes Absolute 2.6 0.7 - 3.1 x10E3/uL   Monocytes Absolute 0.5 0.1 - 0.9 x10E3/uL   EOS (ABSOLUTE) 0.5 (H) 0.0 - 0.4 x10E3/uL   Basophils Absolute 0.1 0.0 - 0.2 x10E3/uL   Immature Granulocytes 0 Not Estab. %   Immature Grans (Abs) 0.0 0.0 - 0.1 x10E3/uL  Comprehensive metabolic panel with GFR   Collection Time: 10/14/24  8:28 AM  Result Value Ref Range   Glucose 83 70 - 99 mg/dL   BUN 9 8 - 27 mg/dL   Creatinine, Ser 9.32 0.57 - 1.00 mg/dL   eGFR 90 >40 fO/fpw/8.26   BUN/Creatinine Ratio 13 12 - 28   Sodium 138 134 - 144 mmol/L   Potassium 3.8 3.5 - 5.2 mmol/L   Chloride 98 96 - 106 mmol/L   CO2 26 20 - 29 mmol/L   Calcium  9.5 8.7 - 10.3 mg/dL   Total Protein 6.2 6.0 - 8.5 g/dL   Albumin 4.1 3.8 - 4.8 g/dL   Globulin, Total 2.1 1.5 - 4.5 g/dL   Bilirubin Total 0.3 0.0 - 1.2 mg/dL   Alkaline Phosphatase 66 49 - 135 IU/L   AST 20 0 - 40 IU/L   ALT 23 0 - 32 IU/L  Lipid Panel w/o Chol/HDL Ratio   Collection Time: 10/14/24  8:28 AM  Result Value Ref Range   Cholesterol, Total 149 100 - 199 mg/dL   Triglycerides 890 0 - 149 mg/dL   HDL 63 >60 mg/dL   VLDL Cholesterol Cal 20 5 - 40 mg/dL   LDL Chol Calc (NIH) 66 0 - 99 mg/dL  TSH   Collection Time: 10/14/24  8:28 AM  Result Value Ref Range   TSH 0.919 0.450 - 4.500 uIU/mL  VITAMIN D  25 Hydroxy (Vit-D Deficiency, Fractures)   Collection Time: 10/14/24  8:28 AM  Result Value Ref Range   Vit D, 25-Hydroxy 52.3 30.0 - 100.0 ng/mL      Assessment & Plan:  Problem List Items Addressed This Visit       Musculoskeletal and Integument   Skin lesion - Primary   Scheduled to see dermatology in January for removal and full skin assessment, which she requested. Appears benign, skin tag. Discussed with patient today.      Other Visit Diagnoses       Flu vaccine need       Flu vaccine today,  educated patient.   Relevant Orders   Flu vaccine trivalent PF, 6mos and older(Flulaval,Afluria,Fluarix,Fluzone) (Completed)        Follow up plan: Return for as scheduled May.      "

## 2024-12-24 ENCOUNTER — Other Ambulatory Visit (HOSPITAL_COMMUNITY): Payer: Self-pay

## 2024-12-24 ENCOUNTER — Telehealth: Payer: Self-pay

## 2024-12-24 NOTE — Telephone Encounter (Signed)
 Pharmacy Patient Advocate Encounter  Received notification from [facility] that Prior Authorization for Repatha  140 has been APPROVED from 12/24/24 to 12/24/25. Unable to obtain price due to refill too soon rejection, last fill date 12/15/24 next available fill date3/8/26   PA #/Case ID/Reference #: # [id number]

## 2024-12-24 NOTE — Telephone Encounter (Signed)
 Pharmacy Patient Advocate Encounter   Received notification from [facility] KEY that prior authorization for Repatha  140 is required/requested.   Insurance verification completed.   The patient is insured through [facility].   Per test claim: PA required; PA submitted to above mentioned insurance via Latent Key/confirmation #/EOC [id number] Status is pending

## 2024-12-25 ENCOUNTER — Ambulatory Visit

## 2024-12-25 DIAGNOSIS — L821 Other seborrheic keratosis: Secondary | ICD-10-CM | POA: Diagnosis not present

## 2024-12-25 DIAGNOSIS — D1801 Hemangioma of skin and subcutaneous tissue: Secondary | ICD-10-CM | POA: Diagnosis not present

## 2024-12-25 DIAGNOSIS — C44529 Squamous cell carcinoma of skin of other part of trunk: Secondary | ICD-10-CM | POA: Diagnosis not present

## 2024-12-25 DIAGNOSIS — D489 Neoplasm of uncertain behavior, unspecified: Secondary | ICD-10-CM

## 2024-12-25 DIAGNOSIS — L820 Inflamed seborrheic keratosis: Secondary | ICD-10-CM | POA: Diagnosis not present

## 2024-12-25 NOTE — Patient Instructions (Signed)

## 2024-12-25 NOTE — Progress Notes (Signed)
" °  °  Subjective   Janet Taylor is a 79 y.o. female who presents for the following: Lesion(s) of concern . Patient is new patient  Today patient reports: LOC on the back   Review of Systems:    No other skin or systemic complaints except as noted in HPI or Assessment and Plan.  The following portions of the chart were reviewed this encounter and updated as appropriate: medications, allergies, medical history  Relevant Medical History:  n/a   Objective  (SKPE) Well appearing patient in no apparent distress; mood and affect are within normal limits. Examination was performed of the: Focused Exam of: back    Examination notable for: Angioma(s): Scattered red vascular papule(s)  , Seborrheic Keratosis(es): Stuck-on appearing keratotic papule(s) on the trunk, none  irritated with redness, crusting, edema, and/or partial avulsion  Examination limited by: Clothing and Patient deferred removal     Mid Back 1cm Exophytic hyperkeratotic papule    Assessment & Plan  (SKAP)   BENIGN SKIN FINDINGS  - Seborrheic keratoses  - Hemangiomas  - Reassurance provided regarding the benign appearance of lesions noted on exam today; no treatment is indicated in the absence of symptoms/changes. - Reinforced importance of photoprotective strategies including liberal and frequent sunscreen use of a broad-spectrum SPF 30 or greater, use of protective clothing, and sun avoidance for prevention of cutaneous malignancy and photoaging.  Counseled patient on the importance of regular self-skin monitoring as well as routine clinical skin examinations as scheduled.   Biopsy photo uploaded to wrong chart - will message IT to move   Was sun protection counseling provided?: No   Level of service outlined above   Patient instructions (SKPI)   Procedures, orders, diagnosis for this visit:  NEOPLASM OF UNCERTAIN BEHAVIOR Mid Back - Epidermal / dermal shaving  Lesion diameter (cm):  1 Informed consent: discussed  and consent obtained   Timeout: patient name, date of birth, surgical site, and procedure verified   Procedure prep:  Patient was prepped and draped in usual sterile fashion Prep type:  Isopropyl alcohol Anesthesia: the lesion was anesthetized in a standard fashion   Anesthetic:  1% lidocaine  w/ epinephrine 1-100,000 buffered w/ 8.4% NaHCO3 Instrument used: DermaBlade   Hemostasis achieved with: pressure and aluminum chloride   Outcome: patient tolerated procedure well   Post-procedure details: wound care instructions given    Specimen 1 - Surgical pathology Differential Diagnosis: SK vs SCC  Check Margins: No  Neoplasm of uncertain behavior -     Epidermal / dermal shaving -     Surgical pathology; Standing    Return to clinic: Return if symptoms worsen or fail to improve.  I, Emerick Ege, CMA am acting as scribe for Lauraine JAYSON Kanaris, MD.   Documentation: I have reviewed the above documentation for accuracy and completeness, and I agree with the above.  Lauraine JAYSON Kanaris, MD   "

## 2024-12-27 LAB — SURGICAL PATHOLOGY

## 2024-12-30 ENCOUNTER — Ambulatory Visit: Payer: Self-pay

## 2024-12-30 NOTE — Progress Notes (Signed)
 Patient informed and appointment scheduled for 01/06/15.

## 2025-01-06 ENCOUNTER — Ambulatory Visit

## 2025-01-09 ENCOUNTER — Ambulatory Visit (INDEPENDENT_AMBULATORY_CARE_PROVIDER_SITE_OTHER)

## 2025-01-09 DIAGNOSIS — C4492 Squamous cell carcinoma of skin, unspecified: Secondary | ICD-10-CM

## 2025-01-09 DIAGNOSIS — C44529 Squamous cell carcinoma of skin of other part of trunk: Secondary | ICD-10-CM | POA: Diagnosis not present

## 2025-01-09 NOTE — Patient Instructions (Signed)

## 2025-01-09 NOTE — Progress Notes (Signed)
" °  °  Subjective   Janet Taylor is a 79 y.o. female who presents for the following: ED&C of SCC. Patient is established patient   Today patient reports: No issues since biopsy.   Review of Systems:    No other skin or systemic complaints except as noted in HPI or Assessment and Plan.  The following portions of the chart were reviewed this encounter and updated as appropriate: medications, allergies, medical history  Relevant Medical History:  Personal history of non melanoma skin cancer - see medical history for full details   Objective  (SKPE) Well appearing patient in no apparent distress; mood and affect are within normal limits. Examination was performed of the: Focused Exam of: mid back   Examination notable for: Well healed biopsy site    Mid Back Well healed biopsy site  Assessment & Plan  (SKAP)    Procedures, orders, diagnosis for this visit:  SQUAMOUS CELL CARCINOMA OF SKIN Mid Back - Destruction of lesion Complexity: simple   Destruction method: electrodesiccation and curettage   Informed consent: discussed and consent obtained   Timeout:  patient name, date of birth, surgical site, and procedure verified Procedure prep:  Patient was prepped and draped in usual sterile fashion Prep type:  Isopropyl alcohol Anesthesia: the lesion was anesthetized in a standard fashion   Anesthetic:  1% lidocaine  w/ epinephrine 1-100,000 local infiltration Curettage performed in three different directions: Yes   Electrodesiccation performed over the curetted area: Yes   Curettage cycles:  3 Final wound size (cm):  1 Hemostasis achieved with:  aluminum chloride and electrodesiccation Outcome: patient tolerated procedure well with no complications   Post-procedure details: sterile dressing applied and wound care instructions given   Dressing type: bandage and petrolatum     Squamous cell carcinoma of skin -     Destruction of lesion    Return to clinic: Return in about 6  months (around 07/09/2025) for TBSE.  I, Emerick Ege, CMA am acting as scribe for Lauraine JAYSON Kanaris, MD.   Documentation: I have reviewed the above documentation for accuracy and completeness, and I agree with the above.  Lauraine JAYSON Kanaris, MD  "

## 2025-04-14 ENCOUNTER — Ambulatory Visit: Admitting: Nurse Practitioner

## 2025-07-09 ENCOUNTER — Ambulatory Visit

## 2025-09-16 ENCOUNTER — Ambulatory Visit
